# Patient Record
Sex: Female | Born: 1945 | Hispanic: No | Marital: Married | Smoking: Never smoker
Health system: Southern US, Community
[De-identification: ages and names within clinical notes are randomized; demographics above are authoritative.]

## PROBLEM LIST (undated history)

## (undated) DIAGNOSIS — C801 Malignant (primary) neoplasm, unspecified: Secondary | ICD-10-CM

## (undated) DIAGNOSIS — J189 Pneumonia, unspecified organism: Secondary | ICD-10-CM

## (undated) DIAGNOSIS — N83201 Unspecified ovarian cyst, right side: Secondary | ICD-10-CM

## (undated) DIAGNOSIS — R7303 Prediabetes: Secondary | ICD-10-CM

## (undated) DIAGNOSIS — R42 Dizziness and giddiness: Secondary | ICD-10-CM

## (undated) DIAGNOSIS — Z9889 Other specified postprocedural states: Secondary | ICD-10-CM

## (undated) DIAGNOSIS — M8580 Other specified disorders of bone density and structure, unspecified site: Secondary | ICD-10-CM

## (undated) DIAGNOSIS — N83202 Unspecified ovarian cyst, left side: Secondary | ICD-10-CM

## (undated) DIAGNOSIS — K219 Gastro-esophageal reflux disease without esophagitis: Secondary | ICD-10-CM

## (undated) DIAGNOSIS — R112 Nausea with vomiting, unspecified: Secondary | ICD-10-CM

## (undated) DIAGNOSIS — M1990 Unspecified osteoarthritis, unspecified site: Secondary | ICD-10-CM

## (undated) DIAGNOSIS — E559 Vitamin D deficiency, unspecified: Secondary | ICD-10-CM

## (undated) DIAGNOSIS — I10 Essential (primary) hypertension: Secondary | ICD-10-CM

## (undated) DIAGNOSIS — D279 Benign neoplasm of unspecified ovary: Secondary | ICD-10-CM

## (undated) DIAGNOSIS — E119 Type 2 diabetes mellitus without complications: Secondary | ICD-10-CM

## (undated) HISTORY — DX: Dizziness and giddiness: R42

## (undated) HISTORY — PX: JOINT REPLACEMENT: SHX530

## (undated) HISTORY — DX: Unspecified ovarian cyst, right side: N83.201

## (undated) HISTORY — DX: Other specified disorders of bone density and structure, unspecified site: M85.80

## (undated) HISTORY — DX: Malignant (primary) neoplasm, unspecified: C80.1

## (undated) HISTORY — DX: Benign neoplasm of unspecified ovary: D27.9

## (undated) HISTORY — PX: CHOLECYSTECTOMY: SHX55

## (undated) HISTORY — DX: Unspecified ovarian cyst, left side: N83.202

## (undated) HISTORY — PX: APPENDECTOMY: SHX54

## (undated) HISTORY — PX: CYST EXCISION: SHX5701

## (undated) HISTORY — PX: ROTATOR CUFF REPAIR: SHX139

## (undated) HISTORY — DX: Gastro-esophageal reflux disease without esophagitis: K21.9

## (undated) HISTORY — DX: Vitamin D deficiency, unspecified: E55.9

---

## 1999-04-11 ENCOUNTER — Other Ambulatory Visit: Admission: RE | Admit: 1999-04-11 | Discharge: 1999-04-11 | Payer: Self-pay | Admitting: Obstetrics and Gynecology

## 2000-04-15 ENCOUNTER — Other Ambulatory Visit: Admission: RE | Admit: 2000-04-15 | Discharge: 2000-04-15 | Payer: Self-pay | Admitting: Obstetrics and Gynecology

## 2001-02-17 ENCOUNTER — Other Ambulatory Visit: Admission: RE | Admit: 2001-02-17 | Discharge: 2001-02-17 | Payer: Self-pay | Admitting: Obstetrics and Gynecology

## 2001-02-17 ENCOUNTER — Encounter (INDEPENDENT_AMBULATORY_CARE_PROVIDER_SITE_OTHER): Payer: Self-pay | Admitting: Specialist

## 2003-08-06 ENCOUNTER — Other Ambulatory Visit: Admission: RE | Admit: 2003-08-06 | Discharge: 2003-08-06 | Payer: Self-pay | Admitting: Obstetrics and Gynecology

## 2004-08-27 ENCOUNTER — Other Ambulatory Visit: Admission: RE | Admit: 2004-08-27 | Discharge: 2004-08-27 | Payer: Self-pay | Admitting: Obstetrics and Gynecology

## 2005-09-01 ENCOUNTER — Other Ambulatory Visit: Admission: RE | Admit: 2005-09-01 | Discharge: 2005-09-01 | Payer: Self-pay | Admitting: Obstetrics and Gynecology

## 2006-09-09 ENCOUNTER — Other Ambulatory Visit: Admission: RE | Admit: 2006-09-09 | Discharge: 2006-09-09 | Payer: Self-pay | Admitting: Obstetrics and Gynecology

## 2006-10-23 HISTORY — PX: PELVIC LAPAROSCOPY: SHX162

## 2006-10-23 HISTORY — PX: OOPHORECTOMY: SHX86

## 2006-10-26 ENCOUNTER — Ambulatory Visit (HOSPITAL_BASED_OUTPATIENT_CLINIC_OR_DEPARTMENT_OTHER): Admission: RE | Admit: 2006-10-26 | Discharge: 2006-10-26 | Payer: Self-pay | Admitting: Obstetrics and Gynecology

## 2006-10-26 ENCOUNTER — Encounter (INDEPENDENT_AMBULATORY_CARE_PROVIDER_SITE_OTHER): Payer: Self-pay | Admitting: *Deleted

## 2007-09-12 ENCOUNTER — Other Ambulatory Visit: Admission: RE | Admit: 2007-09-12 | Discharge: 2007-09-12 | Payer: Self-pay | Admitting: Obstetrics and Gynecology

## 2008-09-19 ENCOUNTER — Encounter: Payer: Self-pay | Admitting: Obstetrics and Gynecology

## 2008-09-19 ENCOUNTER — Ambulatory Visit: Payer: Self-pay | Admitting: Obstetrics and Gynecology

## 2008-09-19 ENCOUNTER — Other Ambulatory Visit: Admission: RE | Admit: 2008-09-19 | Discharge: 2008-09-19 | Payer: Self-pay | Admitting: Obstetrics and Gynecology

## 2009-10-03 ENCOUNTER — Ambulatory Visit: Payer: Self-pay | Admitting: Obstetrics and Gynecology

## 2009-10-03 ENCOUNTER — Encounter: Payer: Self-pay | Admitting: Obstetrics and Gynecology

## 2009-10-03 ENCOUNTER — Other Ambulatory Visit: Admission: RE | Admit: 2009-10-03 | Discharge: 2009-10-03 | Payer: Self-pay | Admitting: Obstetrics and Gynecology

## 2010-07-01 ENCOUNTER — Ambulatory Visit: Payer: Self-pay | Admitting: Obstetrics and Gynecology

## 2010-10-20 ENCOUNTER — Ambulatory Visit: Payer: Self-pay | Admitting: Obstetrics and Gynecology

## 2010-10-20 ENCOUNTER — Other Ambulatory Visit
Admission: RE | Admit: 2010-10-20 | Discharge: 2010-10-20 | Payer: Self-pay | Source: Home / Self Care | Admitting: Obstetrics and Gynecology

## 2011-04-10 NOTE — Op Note (Signed)
Natalie Fowler, Natalie Fowler              ACCOUNT NO.:  [id number]   MEDICAL RECORD NO.:  [id number]          PATIENT TYPE:  AMB   LOCATION:  [facility]                         FACILITY:  [facility]   PHYSICIAN:  Daniel L. Gottsegen, M.D.DATE OF BIRTH:  December 19, 1945   DATE OF PROCEDURE:  10/26/2006  DATE OF DISCHARGE:                               OPERATIVE REPORT   PREOPERATIVE DIAGNOSIS:  Bilateral ovarian cyst.   POSTOPERATIVE DIAGNOSIS:  Bilateral ovarian cyst.   OPERATIONS:  Diagnostic laparoscopy with bilateral salpingo-  oophorectomy.   SURGEON:  Daniel L. Eda Paschal, M.D.   FIRST ASSISTANT:  Timothy P. Fontaine, M.D.   INDICATIONS:  The patient is a 65 year old postmenopausal woman who came  to the office and on ultrasound had two a small ovarian cysts. On the  right ovary.  It was a small complex cyst and on the left ovary.  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  These abnormal findings in the  postmenopausal state coupled with her strong family history of ovarian  cancer made it imperative that we remove her ovaries to be sure that  these were not malignant.  She now enters the hospital for the above.   FINDINGS:  The patient's uterus was almost normal size and shape.  There  were several small myomas that could be seen none of which was bigger  than 2 cm.  The right ovary had a small ovarian cyst that to be seen  through the capsule.  It was not opened.  The left ovary had a hard area  on one pole consistent with a fibroma.  Rest of the pelvic peritoneum  was free of disease.   PROCEDURE:  After adequate general orotracheal anesthesia the patient  was placed in the dorsal lithotomy position, prepped and draped usual  sterile manner.  A small subumbilical vertical incision was made.  The  OptiVu was attached to the diagnostic scope and under direct vision the  peritoneal cavity was entered without difficulty or trauma.  A  pneumoperitoneum was created.  Two 5 mm ports were  placed in the right  left lower quadrant areas.  Peritoneal washings were obtained and then  bilateral salpingo-oophorectomies were performed.  First the left ovary  and tube was removed.  The ureter was identified.  The IP ligament was  bipolared and cut and the rest of the attachments of the adnexa to the  broad ligament and to the uterus were bipolared and cut without  difficulty.  Attention was next turned to the right side.  The ovary was  once again identified.  The IP ligament was once again bipolared and  cut.  Once again all attachments of the adnexa to broad ligament and  uterus were bipolared and cut.  At this point the 10 mm scope was  removed.  A 5 mm scope was placed.  The Endopouch was placed through the  larger incision.  Both specimens were removed and then separated.  There  was no bleeding noted.  The procedure was terminated.  The subumbilical  fascial incision was closed with 0 Vicryl and  the skin incision at the  umbilicus  was closed with 3-0 Vicryl.  Dermabond was used on the other two  incisions.  Blood loss for the procedure was minimal.  The patient  tolerated the procedure well and left the operating room in satisfactory  condition.  Please note that during entire procedure a Robinson catheter  had been left in her bladder to keep the bladder drained.      Daniel L. Eda Paschal, M.D.  Electronically Signed     DLG/MEDQ  D:  10/26/2006  T:  10/26/2006  Job:  [id number]

## 2011-11-12 DIAGNOSIS — K219 Gastro-esophageal reflux disease without esophagitis: Secondary | ICD-10-CM | POA: Insufficient documentation

## 2011-11-12 DIAGNOSIS — D279 Benign neoplasm of unspecified ovary: Secondary | ICD-10-CM | POA: Insufficient documentation

## 2011-11-12 DIAGNOSIS — N83201 Unspecified ovarian cyst, right side: Secondary | ICD-10-CM | POA: Insufficient documentation

## 2011-11-12 DIAGNOSIS — M8580 Other specified disorders of bone density and structure, unspecified site: Secondary | ICD-10-CM | POA: Insufficient documentation

## 2011-11-23 ENCOUNTER — Encounter: Payer: Self-pay | Admitting: Obstetrics and Gynecology

## 2011-11-23 ENCOUNTER — Other Ambulatory Visit (HOSPITAL_COMMUNITY)
Admission: RE | Admit: 2011-11-23 | Discharge: 2011-11-23 | Disposition: A | Discharge status: Home / Self Care | Payer: Managed Care, Other (non HMO) | Source: Ambulatory Visit | Attending: Obstetrics and Gynecology | Admitting: Obstetrics and Gynecology

## 2011-11-23 ENCOUNTER — Ambulatory Visit (INDEPENDENT_AMBULATORY_CARE_PROVIDER_SITE_OTHER): Payer: Managed Care, Other (non HMO) | Admitting: Obstetrics and Gynecology

## 2011-11-23 VITALS — BP 124/78 | Ht 60.0 in | Wt 162.0 lb

## 2011-11-23 DIAGNOSIS — R823 Hemoglobinuria: Secondary | ICD-10-CM

## 2011-11-23 DIAGNOSIS — R42 Dizziness and giddiness: Secondary | ICD-10-CM | POA: Insufficient documentation

## 2011-11-23 DIAGNOSIS — Z01419 Encounter for gynecological examination (general) (routine) without abnormal findings: Secondary | ICD-10-CM

## 2011-11-23 NOTE — Progress Notes (Signed)
Patient came to see me today for her annual GYN exam. She is doing well without HRT. She is up-to-date on mammograms. We did not get her last one in she will have sent to me. She is up-to-date on bone densities. She has stable low bone mass without an elevated FRAX risk. She takes calcium and vitamin D. She has not had a fracture. She does her lab through her PCP. He is watching her spleen. She is having no vaginal bleeding or pelvic pain.  Physical examination:Natalie Fowler present.HEENT within normal limits. Neck: Thyroid not large. No masses. Supraclavicular nodes: not enlarged. Breasts: Examined in both sitting midline position. No skin changes and no masses. Abdomen: Soft no guarding rebound or masses or hernia. Pelvic: External: Within normal limits. BUS: Within normal limits. Vaginal:within normal limits. Good estrogen effect. No evidence of cystocele rectocele or enterocele. Cervix: clean. Uterus: Normal size and shape. Adnexa: No masses. Rectovaginal exam: Confirmatory and negative. Extremities: Within normal limits.  Assessment: Low bone mass.  Plan: Continue yearly mammograms. Continue periodic bone densities.

## 2011-11-25 LAB — URINE CULTURE

## 2012-07-11 ENCOUNTER — Other Ambulatory Visit: Payer: Self-pay | Admitting: Obstetrics and Gynecology

## 2012-07-11 DIAGNOSIS — M8580 Other specified disorders of bone density and structure, unspecified site: Secondary | ICD-10-CM

## 2012-07-12 ENCOUNTER — Ambulatory Visit (INDEPENDENT_AMBULATORY_CARE_PROVIDER_SITE_OTHER): Payer: BC Managed Care – PPO

## 2012-07-12 DIAGNOSIS — M949 Disorder of cartilage, unspecified: Secondary | ICD-10-CM

## 2012-07-12 DIAGNOSIS — M8580 Other specified disorders of bone density and structure, unspecified site: Secondary | ICD-10-CM

## 2012-08-10 ENCOUNTER — Encounter: Payer: Self-pay | Admitting: Obstetrics and Gynecology

## 2012-11-22 ENCOUNTER — Ambulatory Visit: Payer: BC Managed Care – PPO | Admitting: Obstetrics and Gynecology

## 2013-09-21 ENCOUNTER — Ambulatory Visit (INDEPENDENT_AMBULATORY_CARE_PROVIDER_SITE_OTHER): Payer: BC Managed Care – PPO | Admitting: Gynecology

## 2013-09-21 ENCOUNTER — Encounter: Payer: Self-pay | Admitting: Gynecology

## 2013-09-21 ENCOUNTER — Other Ambulatory Visit (HOSPITAL_COMMUNITY)
Admission: RE | Admit: 2013-09-21 | Discharge: 2013-09-21 | Disposition: A | Discharge status: Home / Self Care | Payer: BC Managed Care – PPO | Source: Ambulatory Visit | Attending: Gynecology | Admitting: Gynecology

## 2013-09-21 VITALS — BP 130/86 | Ht 61.25 in | Wt 163.0 lb

## 2013-09-21 DIAGNOSIS — N952 Postmenopausal atrophic vaginitis: Secondary | ICD-10-CM

## 2013-09-21 DIAGNOSIS — Z23 Encounter for immunization: Secondary | ICD-10-CM

## 2013-09-21 DIAGNOSIS — Z01419 Encounter for gynecological examination (general) (routine) without abnormal findings: Secondary | ICD-10-CM | POA: Insufficient documentation

## 2013-09-21 DIAGNOSIS — M8580 Other specified disorders of bone density and structure, unspecified site: Secondary | ICD-10-CM

## 2013-09-21 DIAGNOSIS — Z1151 Encounter for screening for human papillomavirus (HPV): Secondary | ICD-10-CM | POA: Insufficient documentation

## 2013-09-21 DIAGNOSIS — R635 Abnormal weight gain: Secondary | ICD-10-CM

## 2013-09-21 DIAGNOSIS — M899 Disorder of bone, unspecified: Secondary | ICD-10-CM

## 2013-09-21 DIAGNOSIS — Z1159 Encounter for screening for other viral diseases: Secondary | ICD-10-CM

## 2013-09-21 NOTE — Progress Notes (Signed)
Natalie Fowler 04-22-1946 [id number]   History:    67 y.o.  for annual gyn exam with no complaints today. Patient states that she has never been on hormone replacement therapy. Her PCP has been doing her lab work. Her flu vaccine is up-to-date. Patient's last bone density study was in 2013. The lowest T score was -1.6 at the [facility]. She also did have a FRAX analysis based on her hips or and was normal. 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 The patient has a very strong family history of GI malignancies whereby her father has history of colon cancer and one of her all goals had colon cancer and the other uncle had gastric cancer. Her last colonoscopy is due in 2016. Patient herself has had history of colon polyp. Patient denies any prior history of abnormal Pap smears. She has not received the Tdap vaccine yet. Her mammogram was reported to be normal this year.  Past medical history,surgical history, family history and social history were all reviewed and documented in the EPIC chart.  Gynecologic History No LMP recorded. Patient is postmenopausal. Contraception: post menopausal status Last Pap: 2012. Results were: normal Last mammogram: 2014. Results were: normal  Obstetric History OB History  Gravida Para Term Preterm AB SAB TAB Ectopic Multiple Living  2 2        2     # Outcome Date GA Lbr Len/2nd Weight Sex Delivery Anes PTL Lv  2 PAR           1 PAR                ROS: A ROS was performed and pertinent positives and negatives are included in the history.  GENERAL: No fevers or chills. HEENT: No change in vision, no earache, sore throat or sinus congestion. NECK: No pain or stiffness. CARDIOVASCULAR: No chest pain or pressure. No palpitations. PULMONARY: No shortness of breath, cough or wheeze. GASTROINTESTINAL: No abdominal pain, nausea, vomiting or diarrhea, melena or bright red blood per  rectum. GENITOURINARY: No urinary frequency, urgency, hesitancy or dysuria. MUSCULOSKELETAL: No joint or muscle pain, no back pain, no recent trauma. DERMATOLOGIC: No rash, no itching, no lesions. ENDOCRINE: No polyuria, polydipsia, no heat or cold intolerance. No recent change in weight. HEMATOLOGICAL: No anemia or easy bruising or bleeding. NEUROLOGIC: No headache, seizures, numbness, tingling or weakness. PSYCHIATRIC: No depression, no loss of interest in normal activity or change in sleep pattern.     Exam: chaperone present  BP 130/86  Ht 5' 1.25" (1.556 m)  Wt 163 lb (73.936 kg)  BMI 30.54 kg/m2  Body mass index is 30.54 kg/(m^2).  General appearance : Well developed well nourished female. No acute distress HEENT: Neck supple, trachea midline, no carotid bruits, no thyroidmegaly Lungs: Clear to auscultation, no rhonchi or wheezes, or rib retractions  Heart: Regular rate and rhythm, no murmurs or gallops Breast:Examined in sitting and supine position were symmetrical in appearance, no palpable masses or tenderness,  no skin retraction, no nipple inversion, no nipple discharge, no skin discoloration, no axillary or supraclavicular lymphadenopathy Abdomen: no palpable masses or tenderness, no rebound or guarding Extremities: no edema or skin discoloration or tenderness  Pelvic:  Bartholin, Urethra, Skene Glands: Within normal limits             Vagina: No gross lesions or discharge  Cervix: No gross lesions or discharge  Uterus  anteverted, normal size, shape and consistency,  non-tender and mobile  Adnexa  Without masses or tenderness  Anus and perineum  normal   Rectovaginal  normal sphincter tone without palpated masses or tenderness             Hemoccult no palpable masses or tenderness     Assessment/Plan:  68 y.o. female for annual exam doing well on no hormone replacement therapy. PCP doing her labs next week. Pap smear was done today. This will be patient's last Pap smear  in accordance to the new guidelines. She was monitored in her monthly breast exam. We discussed the importance of calcium and vitamin D and regular exercise for osteoporosis prevention. Patient did receive a Tdap vaccine today. She was provided with Hemoccult card system in to the office for testing.  New CDC guidelines is recommending patients be tested once in her lifetime for hepatitis C antibody who were born between [birth year] through 1963. This was discussed with the patient today and has agreed to be tested today.  Note: This dictation was prepared with  Dragon/digital dictation along withSmart phrase technology. Any transcriptional errors that result from this process are unintentional.   Ok Edwards MD, 12:10 PM 09/21/2013

## 2013-09-21 NOTE — Addendum Note (Signed)
Addended by: Bertram Savin A on: 09/21/2013 12:25 PM   Modules accepted: Orders

## 2013-09-21 NOTE — Patient Instructions (Signed)

## 2013-09-26 ENCOUNTER — Encounter: Payer: Self-pay | Admitting: Obstetrics and Gynecology

## 2014-09-19 ENCOUNTER — Encounter: Payer: Self-pay | Admitting: Gynecology

## 2014-09-24 ENCOUNTER — Encounter: Payer: Self-pay | Admitting: Gynecology

## 2014-09-25 ENCOUNTER — Ambulatory Visit (INDEPENDENT_AMBULATORY_CARE_PROVIDER_SITE_OTHER): Payer: 59 | Admitting: Gynecology

## 2014-09-25 ENCOUNTER — Encounter: Payer: Self-pay | Admitting: Gynecology

## 2014-09-25 VITALS — BP 126/84 | Ht 60.75 in | Wt 171.0 lb

## 2014-09-25 DIAGNOSIS — IMO0002 Reserved for concepts with insufficient information to code with codable children: Secondary | ICD-10-CM

## 2014-09-25 DIAGNOSIS — M8580 Other specified disorders of bone density and structure, unspecified site: Secondary | ICD-10-CM

## 2014-09-25 DIAGNOSIS — Z79890 Hormone replacement therapy (postmenopausal): Secondary | ICD-10-CM

## 2014-09-25 DIAGNOSIS — N941 Dyspareunia: Secondary | ICD-10-CM

## 2014-09-25 DIAGNOSIS — N952 Postmenopausal atrophic vaginitis: Secondary | ICD-10-CM | POA: Insufficient documentation

## 2014-09-25 MED ORDER — ESTRADIOL 10 MCG VA TABS: 1.0000 | ORAL_TABLET | VAGINAL | Status: DC

## 2014-09-25 NOTE — Patient Instructions (Signed)

## 2014-09-25 NOTE — Progress Notes (Signed)
Natalie Fowler [birth year] [id number]   History:    68 y.o.  for GYN exam and follow-up. Patient's only complaint is been of vaginal dryness and dyspareunia times. Patient has never been on any hormone replacement therapy. Her PCP has been doing her lab work. Her flu vaccine is up-to-date. Patient's last bone density study was in 2013. The lowest T score was -1.6 at the [facility]. She also did have a FRAX analysis based on her hips or and was normal. 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 The patient has a very strong family history of GI malignancies whereby her father has history of colon cancer and one of her all goals had colon cancer and the other uncle had gastric cancer. Her Next colonoscopy is due in 2016. Patient herself has had history of colon polyp. Patient denies any prior history of abnormal Pap smears.  Patient last year was tested for hepatitis C and was negative in accordance to the new guidelines. Patient shingles vaccine and flu vaccine and TD@vaccine  are up-to-date.  Past medical history,surgical history, family history and social history were all reviewed and documented in the EPIC chart.  Gynecologic History No LMP recorded. Patient is postmenopausal. Contraception: post menopausal status Last Pap: 2014. Results were: normal Last mammogram: 2015. Results were: normal but dense she will need 3-dimensional mammogram next year  Obstetric History OB History  Gravida Para Term Preterm AB SAB TAB Ectopic Multiple Living  2 2        2     # Outcome Date GA Lbr Len/2nd Weight Sex Delivery Anes PTL Lv  2 Para           1 Para                ROS: A ROS was performed and pertinent positives and negatives are included in the history.  GENERAL: No fevers or chills. HEENT: No change in vision, no earache, sore throat or sinus congestion. NECK: No pain or stiffness. CARDIOVASCULAR: No  chest pain or pressure. No palpitations. PULMONARY: No shortness of breath, cough or wheeze. GASTROINTESTINAL: No abdominal pain, nausea, vomiting or diarrhea, melena or bright red blood per rectum. GENITOURINARY: No urinary frequency, urgency, hesitancy or dysuria. MUSCULOSKELETAL: No joint or muscle pain, no back pain, no recent trauma. DERMATOLOGIC: No rash, no itching, no lesions. ENDOCRINE: No polyuria, polydipsia, no heat or cold intolerance. No recent change in weight. HEMATOLOGICAL: No anemia or easy bruising or bleeding. NEUROLOGIC: No headache, seizures, numbness, tingling or weakness. PSYCHIATRIC: No depression, no loss of interest in normal activity or change in sleep pattern.     Exam: chaperone present  BP 126/84 mmHg  Ht 5' 0.75" (1.543 m)  Wt 171 lb (77.565 kg)  BMI 32.58 kg/m2  Body mass index is 32.58 kg/(m^2).  General appearance : Well developed well nourished female. No acute distress HEENT: Neck supple, trachea midline, no carotid bruits, no thyroidmegaly Lungs: Clear to auscultation, no rhonchi or wheezes, or rib retractions  Heart: Regular rate and rhythm, no murmurs or gallops Breast:Examined in sitting and supine position were symmetrical in appearance, no palpable masses or tenderness,  no skin retraction, no nipple inversion, no nipple discharge, no skin discoloration, no axillary or supraclavicular lymphadenopathy Abdomen: no palpable masses or tenderness, no rebound or guarding Extremities: no edema or skin discoloration or tenderness  Pelvic:  Bartholin, Urethra, Skene Glands: Within normal limits  Vagina: No gross lesions or discharge, vaginal atrophy  Cervix: No gross lesions or discharge  Uterus  anteverted, normal size, shape and consistency, non-tender and mobile  Adnexa  Without masses or tenderness  Anus and perineum  normal   Rectovaginal  normal sphincter tone without palpated masses or tenderness             Hemoccult will be provided  today     Assessment/Plan:  68 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 She will be prescribed Vagifem 10 g to apply intravaginally twice a week. Risk benefits and pros and cons were discussed. The women's health initiative study was discussed. Pap smear not done today in accordance to the new guidelines. Patient with no prior history of abnormal Pap smears.patient was reminded to do her monthly breast exam. Next year she will need a three-dimensional mammogram as a result of her dense breasts that was noted recently. We discussed importance of calcium vitamin D and regular exercise for osteoporosis prevention. Patient will schedule her bone density study in the next few weeks. She was provided with fecal Hemoccult cards to submit to the office for testing.   Terrance Mass MD, 11:17 AM 09/25/2014

## 2014-10-09 ENCOUNTER — Ambulatory Visit (INDEPENDENT_AMBULATORY_CARE_PROVIDER_SITE_OTHER): Payer: 59

## 2014-10-09 DIAGNOSIS — M8580 Other specified disorders of bone density and structure, unspecified site: Secondary | ICD-10-CM

## 2014-10-24 ENCOUNTER — Other Ambulatory Visit: Payer: Self-pay | Admitting: Gynecology

## 2014-10-24 DIAGNOSIS — M8580 Other specified disorders of bone density and structure, unspecified site: Secondary | ICD-10-CM

## 2014-10-25 ENCOUNTER — Other Ambulatory Visit: Payer: 59

## 2014-10-25 DIAGNOSIS — M8580 Other specified disorders of bone density and structure, unspecified site: Secondary | ICD-10-CM

## 2014-10-26 LAB — PTH, INTACT AND CALCIUM
CALCIUM: 9.4 mg/dL (ref 8.4–10.5)
PTH: 43 pg/mL (ref 14–64)

## 2014-10-26 LAB — VITAMIN D 25 HYDROXY (VIT D DEFICIENCY, FRACTURES): Vit D, 25-Hydroxy: 23 ng/mL — ABNORMAL LOW (ref 30–100)

## 2014-10-29 ENCOUNTER — Other Ambulatory Visit: Payer: Self-pay | Admitting: Gynecology

## 2014-10-29 DIAGNOSIS — E559 Vitamin D deficiency, unspecified: Secondary | ICD-10-CM

## 2014-10-29 MED ORDER — VITAMIN D (ERGOCALCIFEROL) 1.25 MG (50000 UNIT) PO CAPS: 50000.0000 [IU] | ORAL_CAPSULE | ORAL | Status: DC

## 2014-11-02 ENCOUNTER — Encounter: Payer: Self-pay | Admitting: Gynecology

## 2014-11-02 ENCOUNTER — Ambulatory Visit (INDEPENDENT_AMBULATORY_CARE_PROVIDER_SITE_OTHER): Payer: 59 | Admitting: Gynecology

## 2014-11-02 VITALS — BP 134/88

## 2014-11-02 DIAGNOSIS — M8580 Other specified disorders of bone density and structure, unspecified site: Secondary | ICD-10-CM

## 2014-11-02 DIAGNOSIS — Z79890 Hormone replacement therapy (postmenopausal): Secondary | ICD-10-CM

## 2014-11-02 DIAGNOSIS — N952 Postmenopausal atrophic vaginitis: Secondary | ICD-10-CM

## 2014-11-02 MED ORDER — RALOXIFENE HCL 60 MG PO TABS: 60.0000 mg | ORAL_TABLET | Freq: Every day | ORAL | Status: DC

## 2014-11-02 NOTE — Progress Notes (Signed)
   Patient is a 68 year old who presented to the office today to discuss her bone density study. She was seen the office for her annual exam on November 3. Her bone density study done in 2013 demonstrated that her lowest T score was -1.6 at the [facility]. She also did have a FRAX analysis based on her hips or and was normal. 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 On that office visit for vaginal atrophy she was started on Vagifem 10 g twice a week which she has not started yet.  Her bone density study was done here in office on November 17 and was compared with the previous study done here as well on 07/12/2012. [facility] T score -2.2 (significant decrease in bone mineralization -6.7%) Right and left femoral neck T score -1.2 Normal Frax analysis  Patient had a normal calcium, vitamin D and PTH level December 2015.  In consultation we discussed the above findings indicating significant decrease in bone mineralization at the [facility]. Because of this on low she had a normal Frax analysis based on hip analysis I'm going to recommend that we start antiresorptive agent. Patient suffers from gastroesophageal reflux. We discussed that her bone density study indicates that her bone mass is 20-25% below normal and her spine fracture risk is 5 times greater than the normal population and her risk of hip fracture 7 times greater than the normal population.  Patient will be started on Evista 60 mg daily. The risks benefits and pros and cons were discussed to include DVT. We also discussed repeating the bone density study in one year to determine if the medication is been effective and monitor response to treatment. We discussed importance of calcium vitamin D and regular exercise for further osteoporosis prevention.

## 2014-11-02 NOTE — Patient Instructions (Signed)
Raloxifene tablets What is this medicine? RALOXIFENE (ral OX i feen) reduces the amount of calcium lost from bones. It is used to treat and prevent osteoporosis in women who have experienced menopause. This medicine may be used for other purposes; ask your health care provider or pharmacist if you have questions. COMMON BRAND NAME(S): Evista What should I tell my health care provider before I take this medicine? They need to know if you have any of these conditions: -a history of blood clots -cancer -heart failure -liver disease -premenopausal -an unusual or allergic reaction to raloxifene, other medicines, foods, dyes, or preservatives -pregnant or trying to get pregnant -breast-feeding How should I use this medicine? Take this medicine by mouth with a glass of water. Follow the directions on the prescription label. The tablets can be taken with or without food. Take your doses at regular intervals. Do not take your medicine more often than directed. Talk to your pediatrician regarding the use of this medicine in children. Special care may be needed. Overdosage: If you think you have taken too much of this medicine contact a poison control center or emergency room at once. NOTE: This medicine is only for you. Do not share this medicine with others. What if I miss a dose? If you miss a dose, take it as soon as you can. If it is almost time for your next dose, take only that dose. Do not take double or extra doses. What may interact with this medicine? -ampicillin -cholestyramine -colestipol -diazepam -diazoxide -female hormones like hormone replacement therapy -lidocaine -warfarin This list may not describe all possible interactions. Give your health care provider a list of all the medicines, herbs, non-prescription drugs, or dietary supplements you use. Also tell them if you smoke, drink alcohol, or use illegal drugs. Some items may interact with your medicine. What should I watch  for while using this medicine? Visit your doctor or health care professional for regular checks on your progress. Do not stop taking this medicine except on the advice of your doctor or health care professional. You should make sure you get enough calcium and vitamin D in your diet while you are taking this medicine. Discuss your dietary needs with your health care professional or nutritionist. Exercise may help to prevent bone loss. Discuss your exercise needs with your doctor or health care professional. This medicine can rarely cause blood clots. You should avoid long periods of bed rest while taking this medicine. If you are going to have surgery, tell your doctor or health care professional that you are taking this medicine. This medicine should be stopped at least 3 days before surgery. After surgery, it should be restarted only after you are walking again. It should not be restarted while you still need long periods of bed rest. You should not smoke while taking this medicine. Smoking may also increase your risk of blood clots. Smoking can also decrease the effects of this medicine. This medicine does not prevent hot flashes. It may cause hot flashes in some patients at the start of therapy. What side effects may I notice from receiving this medicine? Side effects that you should report to your doctor or health care professional as soon as possible: -change in vision -chest pain -difficulty breathing -leg pain or swelling -skin rash, itching Side effects that usually do not require medical attention (report to your doctor or health care professional if they continue or are bothersome): -fluid build-up -leg cramps -stomach pain -sweating This list may not  describe all possible side effects. Call your doctor for medical advice about side effects. You may report side effects to FDA at 1-800-FDA-1088. Where should I keep my medicine? Keep out of the reach of children. Store at room  temperature between 15 and 30 degrees C (59 and 86 degrees F). Throw away any unused medicine after the expiration date. NOTE: This sheet is a summary. It may not cover all possible information. If you have questions about this medicine, talk to your doctor, pharmacist, or health care provider.  2015, Elsevier/Gold Standard. (2008-10-25 15:15:14) Osteoporosis Throughout your life, your body breaks down old bone and replaces it with new bone. As you get older, your body does not replace bone as quickly as it breaks it down. By the age of 16 years, most people begin to gradually lose bone because of the imbalance between bone loss and replacement. Some people lose more bone than others. Bone loss beyond a specified normal degree is considered osteoporosis.  Osteoporosis affects the strength and durability of your bones. The inside of the ends of your bones and your flat bones, like the bones of your pelvis, look like honeycomb, filled with tiny open spaces. As bone loss occurs, your bones become less dense. This means that the open spaces inside your bones become bigger and the walls between these spaces become thinner. This makes your bones weaker. Bones of a person with osteoporosis can become so weak that they can break (fracture) during minor accidents, such as a simple fall. CAUSES  The following factors have been associated with the development of osteoporosis:  Smoking.  Drinking more than 2 alcoholic drinks several days per week.  Long-term use of certain medicines:  Corticosteroids.  Chemotherapy medicines.  Thyroid medicines.  Antiepileptic medicines.  Gonadal hormone suppression medicine.  Immunosuppression medicine.  Being underweight.  Lack of physical activity.  Lack of exposure to the sun. This can lead to vitamin D deficiency.  Certain medical conditions:  Certain inflammatory bowel diseases, such as Crohn disease and ulcerative  colitis.  Diabetes.  Hyperthyroidism.  Hyperparathyroidism. RISK FACTORS Anyone can develop osteoporosis. However, the following factors can increase your risk of developing osteoporosis:  Gender--Women are at higher risk than men.  Age--Being older than 50 years increases your risk.  Ethnicity--White and Asian people have an increased risk.  Weight --Being extremely underweight can increase your risk of osteoporosis.  Family history of osteoporosis--Having a family member who has developed osteoporosis can increase your risk. SYMPTOMS  Usually, people with osteoporosis have no symptoms.  DIAGNOSIS  Signs during a physical exam that may prompt your caregiver to suspect osteoporosis include:  Decreased height. This is usually caused by the compression of the bones that form your spine (vertebrae) because they have weakened and become fractured.  A curving or rounding of the upper back (kyphosis). To confirm signs of osteoporosis, your caregiver may request a procedure that uses 2 low-dose X-ray beams with different levels of energy to measure your bone mineral density (dual-energy X-ray absorptiometry [DXA]). Also, your caregiver may check your level of vitamin D. TREATMENT  The goal of osteoporosis treatment is to strengthen bones in order to decrease the risk of bone fractures. There are different types of medicines available to help achieve this goal. Some of these medicines work by slowing the processes of bone loss. Some medicines work by increasing bone density. Treatment also involves making sure that your levels of calcium and vitamin D are adequate. PREVENTION  There are things  you can do to help prevent osteoporosis. Adequate intake of calcium and vitamin D can help you achieve optimal bone mineral density. Regular exercise can also help, especially resistance and weight-bearing activities. If you smoke, quitting smoking is an important part of osteoporosis prevention. MAKE  SURE YOU:  Understand these instructions.  Will watch your condition.  Will get help right away if you are not doing well or get worse. FOR MORE INFORMATION www.osteo.org and EquipmentWeekly.com.ee Document Released: 08/19/2005 Document Revised: 03/06/2013 Document Reviewed: 10/24/2011 [facility] Patient Information 2015 [place], [place]. This information is not intended to replace advice given to you by your health care provider. Make sure you discuss any questions you have with your health care provider.

## 2014-11-22 ENCOUNTER — Telehealth: Payer: Self-pay

## 2014-11-22 NOTE — Telephone Encounter (Addendum)
Patient is finishing her Vit D 50000 and needed to be reminded what to do next. I advised she will need to have lab for Vit D level and She was also advised to start Vit D3 2000 units daily.  Upon review of chart she just started Vit D 50000 this month. What happened is her pharmacy only gave her #4 pills with 2 refills. I explained to her that some ins co will not pay for 3 mos at a time and the pharmacy gave her one month with 2 refills. She will get this refilled and continue taking for 12 weeks. She will call at the end of Rx for lab appt to have Vit D checked.  Patient wanted to be sure okay to take her fish oil and multivitamin now that she is taking Evista. I told her I could not imagine a problem with that but I would check with Dr. Nicki Reaper?

## 2014-11-22 NOTE — Telephone Encounter (Signed)
Patient reassured.  

## 2014-11-22 NOTE — Telephone Encounter (Signed)
No contraindication reassure her.

## 2014-11-26 ENCOUNTER — Other Ambulatory Visit: Payer: Self-pay

## 2015-01-28 ENCOUNTER — Other Ambulatory Visit: Payer: 59

## 2015-01-28 DIAGNOSIS — E559 Vitamin D deficiency, unspecified: Secondary | ICD-10-CM

## 2015-01-29 ENCOUNTER — Other Ambulatory Visit: Payer: Self-pay | Admitting: Gynecology

## 2015-01-29 ENCOUNTER — Encounter: Payer: Self-pay | Admitting: Gynecology

## 2015-01-29 DIAGNOSIS — E559 Vitamin D deficiency, unspecified: Secondary | ICD-10-CM

## 2015-01-29 LAB — VITAMIN D 25 HYDROXY (VIT D DEFICIENCY, FRACTURES): VIT D 25 HYDROXY: 28 ng/mL — AB (ref 30–100)

## 2015-01-29 MED ORDER — VITAMIN D (ERGOCALCIFEROL) 1.25 MG (50000 UNIT) PO CAPS: ORAL_CAPSULE | ORAL | Status: DC

## 2015-04-30 ENCOUNTER — Telehealth: Payer: Self-pay | Admitting: *Deleted

## 2015-04-30 ENCOUNTER — Other Ambulatory Visit: Payer: PPO

## 2015-04-30 DIAGNOSIS — E559 Vitamin D deficiency, unspecified: Secondary | ICD-10-CM

## 2015-04-30 MED ORDER — RALOXIFENE HCL 60 MG PO TABS: 60.0000 mg | ORAL_TABLET | Freq: Every day | ORAL | Status: DC

## 2015-04-30 NOTE — Telephone Encounter (Signed)
Pt would like Rx mailed to her address, this will be done

## 2015-04-30 NOTE — Telephone Encounter (Signed)
-----   Message from Bishop Limbo sent at 04/30/2015 11:21 AM EDT ----- Natalie Fowler patient has new insurance and stopped by to request a new 90 day RX for her Evista medication. Can you have JF do that for her? Thanks.

## 2015-05-01 ENCOUNTER — Other Ambulatory Visit: Payer: Self-pay | Admitting: Gynecology

## 2015-05-01 DIAGNOSIS — E559 Vitamin D deficiency, unspecified: Secondary | ICD-10-CM

## 2015-05-01 LAB — VITAMIN D 25 HYDROXY (VIT D DEFICIENCY, FRACTURES): VIT D 25 HYDROXY: 25 ng/mL — AB (ref 30–100)

## 2015-05-01 MED ORDER — VITAMIN D (ERGOCALCIFEROL) 1.25 MG (50000 UNIT) PO CAPS: 50000.0000 [IU] | ORAL_CAPSULE | ORAL | Status: DC

## 2015-05-01 MED ORDER — RALOXIFENE HCL 60 MG PO TABS: 60.0000 mg | ORAL_TABLET | Freq: Every day | ORAL | Status: DC

## 2015-05-06 ENCOUNTER — Telehealth: Payer: Self-pay | Admitting: *Deleted

## 2015-05-06 NOTE — Telephone Encounter (Signed)
Patient aware she is on correct Rx.

## 2015-05-06 NOTE — Telephone Encounter (Signed)
Pt was prescribed Vitamin D 50,000 unit apparently the pharmacy gave her Vitamin D2 50,000units. Just to confirm the patient should be taking Vitamin D3 50,000 correct?

## 2015-05-06 NOTE — Telephone Encounter (Signed)
As always: 50,000 units Vit D weekly for 12 weeks then Vit D3   2,000 units weekly thereafter. Vitamin D level blood test after the 12weeks

## 2015-08-14 ENCOUNTER — Other Ambulatory Visit: Payer: Self-pay

## 2015-08-14 DIAGNOSIS — Z1231 Encounter for screening mammogram for malignant neoplasm of breast: Secondary | ICD-10-CM

## 2015-09-19 ENCOUNTER — Ambulatory Visit
Admission: RE | Admit: 2015-09-19 | Discharge: 2015-09-19 | Disposition: A | Discharge status: Home / Self Care | Payer: PPO | Source: Ambulatory Visit

## 2015-09-19 DIAGNOSIS — Z1231 Encounter for screening mammogram for malignant neoplasm of breast: Secondary | ICD-10-CM

## 2015-09-30 ENCOUNTER — Encounter: Payer: Self-pay | Admitting: Gynecology

## 2015-09-30 ENCOUNTER — Ambulatory Visit (INDEPENDENT_AMBULATORY_CARE_PROVIDER_SITE_OTHER): Payer: PPO | Admitting: Gynecology

## 2015-09-30 VITALS — BP 126/80 | Ht 60.75 in | Wt 163.0 lb

## 2015-09-30 DIAGNOSIS — Z9889 Other specified postprocedural states: Secondary | ICD-10-CM

## 2015-09-30 DIAGNOSIS — Z8639 Personal history of other endocrine, nutritional and metabolic disease: Secondary | ICD-10-CM

## 2015-09-30 DIAGNOSIS — Z800 Family history of malignant neoplasm of digestive organs: Secondary | ICD-10-CM | POA: Diagnosis not present

## 2015-09-30 DIAGNOSIS — Z86010 Personal history of colonic polyps: Secondary | ICD-10-CM | POA: Diagnosis not present

## 2015-09-30 DIAGNOSIS — M8580 Other specified disorders of bone density and structure, unspecified site: Secondary | ICD-10-CM

## 2015-09-30 DIAGNOSIS — Z01419 Encounter for gynecological examination (general) (routine) without abnormal findings: Secondary | ICD-10-CM

## 2015-09-30 MED ORDER — RALOXIFENE HCL 60 MG PO TABS: 60.0000 mg | ORAL_TABLET | Freq: Every day | ORAL | Status: DC

## 2015-09-30 NOTE — Addendum Note (Signed)
Addended by: Terrance Mass on: 09/30/2015 08:38 AM   Modules accepted: Level of Service

## 2015-09-30 NOTE — Progress Notes (Signed)
Natalie Fowler 03-08-1946 [id number]   History:    69 y.o.  for annual gyn exam with no complaints today. Patient with known history of vitamin D deficiency. She's currently on 50,000 units every weekly and will complete 6 months of treatment later this month. We are going to check a vitamin D level today. 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 She is tolerate the medicine well without any problems. Patient this year had a colonoscopy were by benign polyps were removed. She is on no hormone replacement therapy. Her follow had died of colon cancer at the age of 83. Patient's vaccines are all up-to-date. Her PCP is Dr. Henreitta Cea who has been doing her blood work.ied  patient with no past history of abnormal Pap smears.   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   Past medical history,surgical history, family history and social history were all reviewed and documented in the EPIC chart.  Gynecologic History No LMP recorded. Patient is postmenopausal. Contraception: post menopausal status Last Pap:  2014 results were: normal Last mammogram:  2016ults were: normal 3-D mammogram dense breasts   Obstetric History OB History  Gravida Para Term Preterm AB SAB TAB Ectopic Multiple Living  2 2        2     # Outcome Date GA Lbr Len/2nd Weight Sex Delivery Anes PTL Lv  2 Para           1 Para                ROS: A ROS was performed and pertinent positives and negatives are included in the history.  GENERAL:  No fevers or chills. HEENT: No change in vision, no earache, sore throat or sinus congestion. NECK: No pain or stiffness. CARDIOVASCULAR: No chest pain or pressure. No palpitations. PULMONARY: No shortness of breath, cough or wheeze. GASTROINTESTINAL: No abdominal pain, nausea, vomiting or diarrhea, melena or bright red blood per rectum. GENITOURINARY: No urinary frequency, urgency, hesitancy or dysuria. MUSCULOSKELETAL: No joint or muscle pain, no back pain, no recent trauma. DERMATOLOGIC: No rash, no itching, no lesions. ENDOCRINE: No polyuria, polydipsia, no heat or cold intolerance. No recent change in weight. HEMATOLOGICAL: No anemia or easy bruising or bleeding. NEUROLOGIC: No headache, seizures, numbness, tingling or weakness. PSYCHIATRIC: No depression, no loss of interest in normal activity or change in sleep pattern.     Exam: chaperone present  BP 126/80 mmHg  Ht 5' 0.75" (1.543 m)  Wt 163 lb (73.936 kg)  BMI 31.05 kg/m2  Body mass index is 31.05 kg/(m^2).  General appearance : Well developed well nourished female. No acute distress HEENT: Eyes: no retinal hemorrhage or exudates,  Neck supple, trachea midline, no carotid bruits, no thyroidmegaly Lungs: Clear to auscultation, no rhonchi or wheezes, or rib retractions  Heart: Regular rate and rhythm, no murmurs or gallops Breast:Examined in sitting and supine position were symmetrical in appearance, no palpable masses or tenderness,  no skin retraction, no nipple inversion, no nipple discharge,  no skin discoloration, no axillary or supraclavicular lymphadenopathy Abdomen: no palpable masses or tenderness, no rebound or guarding Extremities: no edema or skin discoloration or tenderness  Pelvic:  Bartholin, Urethra, Skene Glands: Within normal limits             Vagina: No gross lesions or discharge, vaginal atrophy   Cervix: No gross lesions or discharge  Uterus   antevertedrmal size, shape and consistency, non-tender and  mobile  Adnexa  Without masses or tenderness  Anus and perineum  normal   Rectovaginal  normal sphincter tone without palpated masses or tenderness             Hemoccult colonoscopy this year.     Assessment/Plan:  69 y.o. female for annual examwith history of vitamin D deficiency. A vitamin D level will be checked today. Patient due for her next bone density study next year. Patient was reminded to do her monthly breast exam. No Pap smear done today in accordance to the new guidelines. PCP doing patient blood work.    Terrance Mass MD, 8:31 AM 09/30/2015

## 2015-10-01 LAB — VITAMIN D 25 HYDROXY (VIT D DEFICIENCY, FRACTURES): VIT D 25 HYDROXY: 38 ng/mL (ref 30–100)

## 2015-10-02 ENCOUNTER — Encounter: Payer: PPO | Admitting: Gynecology

## 2016-03-12 DIAGNOSIS — N3011 Interstitial cystitis (chronic) with hematuria: Secondary | ICD-10-CM | POA: Diagnosis not present

## 2016-03-12 DIAGNOSIS — R823 Hemoglobinuria: Secondary | ICD-10-CM | POA: Diagnosis not present

## 2016-04-28 DIAGNOSIS — J329 Chronic sinusitis, unspecified: Secondary | ICD-10-CM | POA: Diagnosis not present

## 2016-05-07 DIAGNOSIS — N3011 Interstitial cystitis (chronic) with hematuria: Secondary | ICD-10-CM | POA: Diagnosis not present

## 2016-05-15 DIAGNOSIS — N3011 Interstitial cystitis (chronic) with hematuria: Secondary | ICD-10-CM | POA: Diagnosis not present

## 2016-05-22 DIAGNOSIS — N3011 Interstitial cystitis (chronic) with hematuria: Secondary | ICD-10-CM | POA: Diagnosis not present

## 2016-05-29 ENCOUNTER — Other Ambulatory Visit: Payer: Self-pay | Admitting: Gynecology

## 2016-05-29 DIAGNOSIS — N3011 Interstitial cystitis (chronic) with hematuria: Secondary | ICD-10-CM | POA: Diagnosis not present

## 2016-05-29 MED ORDER — RALOXIFENE HCL 60 MG PO TABS: 60.0000 mg | ORAL_TABLET | Freq: Every day | ORAL | Status: DC

## 2016-06-05 DIAGNOSIS — N3011 Interstitial cystitis (chronic) with hematuria: Secondary | ICD-10-CM | POA: Diagnosis not present

## 2016-06-15 ENCOUNTER — Other Ambulatory Visit: Payer: Self-pay | Admitting: Gynecology

## 2016-06-15 ENCOUNTER — Telehealth: Payer: Self-pay | Admitting: *Deleted

## 2016-06-15 MED ORDER — RALOXIFENE HCL 60 MG PO TABS: 60.0000 mg | ORAL_TABLET | Freq: Every day | ORAL | 0 refills | Status: DC

## 2016-06-15 NOTE — Telephone Encounter (Signed)
Pt called requesting refill on Evista 60 mg tablet, annual due in Nov. 2017 per note on 09/30/15 " the AP spine with a T score of -2.2 for this reason she was started on Evista 60 mg daily "  Rx will be sent to local pharmacy per pt request.

## 2016-06-26 DIAGNOSIS — R3129 Other microscopic hematuria: Secondary | ICD-10-CM | POA: Diagnosis not present

## 2016-06-26 DIAGNOSIS — N3011 Interstitial cystitis (chronic) with hematuria: Secondary | ICD-10-CM | POA: Diagnosis not present

## 2016-06-26 DIAGNOSIS — R823 Hemoglobinuria: Secondary | ICD-10-CM | POA: Diagnosis not present

## 2016-07-03 ENCOUNTER — Other Ambulatory Visit: Payer: Self-pay | Admitting: Family Medicine

## 2016-07-03 ENCOUNTER — Other Ambulatory Visit: Payer: Self-pay | Admitting: Gynecology

## 2016-07-03 DIAGNOSIS — Z1231 Encounter for screening mammogram for malignant neoplasm of breast: Secondary | ICD-10-CM

## 2016-08-07 DIAGNOSIS — R3129 Other microscopic hematuria: Secondary | ICD-10-CM | POA: Diagnosis not present

## 2016-08-07 DIAGNOSIS — N3011 Interstitial cystitis (chronic) with hematuria: Secondary | ICD-10-CM | POA: Diagnosis not present

## 2016-08-10 DIAGNOSIS — H524 Presbyopia: Secondary | ICD-10-CM | POA: Diagnosis not present

## 2016-08-10 DIAGNOSIS — H527 Unspecified disorder of refraction: Secondary | ICD-10-CM | POA: Diagnosis not present

## 2016-09-18 DIAGNOSIS — N3010 Interstitial cystitis (chronic) without hematuria: Secondary | ICD-10-CM | POA: Diagnosis not present

## 2016-09-21 ENCOUNTER — Other Ambulatory Visit: Payer: Self-pay | Admitting: Gynecology

## 2016-09-21 DIAGNOSIS — Z23 Encounter for immunization: Secondary | ICD-10-CM | POA: Diagnosis not present

## 2016-09-21 DIAGNOSIS — M545 Low back pain: Secondary | ICD-10-CM | POA: Diagnosis not present

## 2016-09-21 DIAGNOSIS — E669 Obesity, unspecified: Secondary | ICD-10-CM | POA: Diagnosis not present

## 2016-09-21 DIAGNOSIS — R7303 Prediabetes: Secondary | ICD-10-CM | POA: Diagnosis not present

## 2016-09-21 DIAGNOSIS — Z0000 Encounter for general adult medical examination without abnormal findings: Secondary | ICD-10-CM | POA: Diagnosis not present

## 2016-09-21 DIAGNOSIS — I700 Atherosclerosis of aorta: Secondary | ICD-10-CM | POA: Diagnosis not present

## 2016-09-21 DIAGNOSIS — E782 Mixed hyperlipidemia: Secondary | ICD-10-CM | POA: Diagnosis not present

## 2016-09-21 DIAGNOSIS — M8580 Other specified disorders of bone density and structure, unspecified site: Secondary | ICD-10-CM | POA: Diagnosis not present

## 2016-09-22 ENCOUNTER — Ambulatory Visit
Admission: RE | Admit: 2016-09-22 | Discharge: 2016-09-22 | Disposition: A | Discharge status: Home / Self Care | Payer: PPO | Source: Ambulatory Visit | Attending: Family Medicine | Admitting: Family Medicine

## 2016-09-22 DIAGNOSIS — Z1231 Encounter for screening mammogram for malignant neoplasm of breast: Secondary | ICD-10-CM

## 2016-09-30 ENCOUNTER — Encounter: Payer: Self-pay | Admitting: Gynecology

## 2016-09-30 ENCOUNTER — Ambulatory Visit (INDEPENDENT_AMBULATORY_CARE_PROVIDER_SITE_OTHER): Payer: PPO | Admitting: Gynecology

## 2016-09-30 VITALS — BP 128/82 | Ht 60.5 in | Wt 163.0 lb

## 2016-09-30 DIAGNOSIS — M8580 Other specified disorders of bone density and structure, unspecified site: Secondary | ICD-10-CM

## 2016-09-30 DIAGNOSIS — Z780 Asymptomatic menopausal state: Secondary | ICD-10-CM

## 2016-09-30 DIAGNOSIS — Z01411 Encounter for gynecological examination (general) (routine) with abnormal findings: Secondary | ICD-10-CM

## 2016-09-30 DIAGNOSIS — Z8639 Personal history of other endocrine, nutritional and metabolic disease: Secondary | ICD-10-CM | POA: Insufficient documentation

## 2016-09-30 NOTE — Progress Notes (Signed)
Natalie Fowler 1946-01-04 [id number]   History:    70 y.o.  for annual gyn exam who has no complaints today. Her records indicated she has had history vitamin D deficiency in the past which was corrected and she's currently taking 2000 units daily. 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 She has tolerated  the medicine well without any problems. Patient had a colonoscopy in 2016 and benign polyps were removed and she's currently on a 3 year recall. Her father died of colon cancer at the age of 81. She is on no hormone replacement therapy. Her PCP has been doing her blood work and all her vaccines are up-to-date.  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  Gynecologic History No LMP recorded. Patient is postmenopausal. Contraception: post menopausal status Last Pap: 2014. Results were: normal Last mammogram: 2017. Results were: normal  Obstetric History OB History  Gravida Para Term Preterm AB Living  2 2       2   SAB TAB Ectopic Multiple Live Births               # Outcome Date GA Lbr Len/2nd Weight Sex Delivery Anes PTL Lv  2 Para           1 Para                ROS: A ROS was performed and pertinent positives and negatives are included in the history.  GENERAL: No fevers or chills. HEENT: No change in vision, no earache, sore throat or sinus congestion. NECK:  No pain or stiffness. CARDIOVASCULAR: No chest pain or pressure. No palpitations. PULMONARY: No shortness of breath, cough or wheeze. GASTROINTESTINAL: No abdominal pain, nausea, vomiting or diarrhea, melena or bright red blood per rectum. GENITOURINARY: No urinary frequency, urgency, hesitancy or dysuria. MUSCULOSKELETAL: No joint or muscle pain, no back pain, no recent trauma. DERMATOLOGIC: No rash, no itching, no lesions. ENDOCRINE: No polyuria, polydipsia, no heat or cold intolerance. No recent change in weight. HEMATOLOGICAL: No anemia or easy bruising or bleeding. NEUROLOGIC: No headache, seizures, numbness, tingling or weakness. PSYCHIATRIC: No depression, no loss of interest in normal activity or change in sleep pattern.     Exam: chaperone present  BP 128/82   Ht 5' 0.5" (1.537 m)   Wt 163 lb (73.9 kg)   BMI 31.31 kg/m   Body Fowler index is 31.31 kg/m.  General appearance : Well developed well nourished female. No acute distress HEENT: Eyes: no retinal hemorrhage or exudates,  Neck supple, trachea midline, no carotid bruits, no thyroidmegaly Lungs: Clear to auscultation, no rhonchi or wheezes, or rib retractions  Heart: Regular rate and rhythm, no murmurs or gallops Breast:Examined in sitting and supine position were symmetrical in appearance, no palpable masses or tenderness,  no skin retraction, no nipple inversion, no nipple discharge, no skin discoloration, no axillary or supraclavicular lymphadenopathy Abdomen:  no palpable masses or tenderness, no rebound or guarding Extremities: no edema or skin discoloration or tenderness  Pelvic:  Bartholin, Urethra, Skene Glands: Within normal limits             Vagina: No gross lesions or discharge, atrophic changes  Cervix: No gross lesions or discharge  Uterus  anteverted, normal size, shape and consistency, non-tender and mobile  Adnexa  Without masses or tenderness  Anus and perineum  normal   Rectovaginal  normal sphincter tone  without palpated masses or tenderness             Hemoccult recent colonoscopy     Assessment/Plan:  70 y.o. female for annual exam with history of osteopenia on Evista 60 mg daily doing well. Bone density study will be scheduled for this year. Recommend follow bone density in 2 years and bone mineral density stable she may go on a drug holiday. Because of her history vitamin D deficiency will check her vitamin D level today. Pap smear no longer indicated. Blood work and vaccines provided by her PCP.   Natalie Mass MD, 9:57 AM 09/30/2016

## 2016-09-30 NOTE — Patient Instructions (Signed)

## 2016-10-05 ENCOUNTER — Ambulatory Visit (INDEPENDENT_AMBULATORY_CARE_PROVIDER_SITE_OTHER): Payer: PPO | Admitting: Orthopaedic Surgery

## 2016-10-05 ENCOUNTER — Encounter (INDEPENDENT_AMBULATORY_CARE_PROVIDER_SITE_OTHER): Payer: Self-pay | Admitting: Orthopaedic Surgery

## 2016-10-05 VITALS — BP 110/70 | Resp 14 | Ht 60.0 in | Wt 163.0 lb

## 2016-10-05 DIAGNOSIS — M545 Low back pain, unspecified: Secondary | ICD-10-CM

## 2016-10-05 DIAGNOSIS — M25552 Pain in left hip: Secondary | ICD-10-CM | POA: Diagnosis not present

## 2016-10-05 MED ORDER — METHYLPREDNISOLONE 4 MG PO TBPK: ORAL_TABLET | ORAL | 0 refills | Status: DC

## 2016-10-05 NOTE — Progress Notes (Deleted)
   Office Visit Note   Patient: Natalie Fowler           Date of Birth: May 28, 1946           MRN: [id number] Visit Date: 10/05/2016              Requested by: Lilian Coma, MD [id number], [place] [address] PCP: Lilian Coma., MD   Assessment & Plan: Visit Diagnoses: No diagnosis found.  Plan: ***  Follow-Up Instructions: No Follow-up on file.   Orders:  No orders of the defined types were placed in this encounter.  No orders of the defined types were placed in this encounter.     Procedures: No procedures performed   Clinical Data: No additional findings.   Subjective: No chief complaint on file.   HPI  Review of Systems   Objective: Vital Signs: There were no vitals taken for this visit.  Physical Exam  Ortho Exam  Specialty Comments:  No specialty comments available.  Imaging: No results found.   PMFS History: Patient Active Problem List   Diagnosis Date Noted  . History of vitamin D deficiency 09/30/2016  . H/O colonoscopy with polypectomy 09/30/2015  . Vaginal atrophy 09/25/2014  . Vertigo   . Osteopenia   . Acid reflux    Past Medical History:  Diagnosis Date  . Acid reflux   . Brenner tumor    benign  . Cancer ([place])   . Osteopenia   . Ovarian cyst, bilateral   . Vertigo   . Vitamin D deficiency     Family History  Problem Relation Age of Onset  . Diabetes Mother   . Hypertension Mother   . Cancer Father     COLON CA  . Osteoporosis Sister   . Cancer Maternal Aunt     OVARIAN CANCER  . Heart disease Maternal Aunt   . Hypertension Daughter   . Cancer Paternal Uncle     Colon cancer    Past Surgical History:  Procedure Laterality Date  . CHOLECYSTECTOMY    . CYST EXCISION     CHEST   . OOPHORECTOMY  10/2006   DIAG LAP WITH BSO  . PELVIC LAPAROSCOPY  10/2006   DIAG LAP WITH BSO  . ROTATOR CUFF REPAIR     Social History   Occupational History  . Not on file.   Social History Main  Topics  . Smoking status: Never Smoker  . Smokeless tobacco: Never Used  . Alcohol use No  . Drug use: Unknown  . Sexual activity: Yes    Birth control/ protection: Post-menopausal

## 2016-10-05 NOTE — Progress Notes (Deleted)
   Office Visit Note   Patient: Natalie Fowler           Date of Birth: [birth year]           MRN: [id number] Visit Date: 10/05/2016              Requested by: Lilian Coma, MD [id number], [place] [address] PCP: Lilian Coma., MD   Assessment & Plan: Visit Diagnoses:  1. Pain in left hip   2. Acute left-sided low back pain without sciatica     Plan: ***  Follow-Up Instructions: Return in about 2 weeks (around 10/19/2016) for low back pain.   Orders:  No orders of the defined types were placed in this encounter.  Meds ordered this encounter  Medications  . methylPREDNISolone (MEDROL DOSEPAK) 4 MG TBPK tablet    Sig: Take as directed    Dispense:  21 tablet    Refill:  0      Procedures: No procedures performed   Clinical Data: No additional findings.   Subjective: No chief complaint on file.   HPI  Review of Systems   Objective: Vital Signs: BP 110/70   Resp 14   Ht 5' (1.524 m)   Wt 163 lb (73.9 kg)   BMI 31.83 kg/m   Physical Exam  Ortho Exam  Specialty Comments:  No specialty comments available.  Imaging: No results found.   PMFS History: Patient Active Problem List   Diagnosis Date Noted  . History of vitamin D deficiency 09/30/2016  . H/O colonoscopy with polypectomy 09/30/2015  . Vaginal atrophy 09/25/2014  . Vertigo   . Osteopenia   . Acid reflux    Past Medical History:  Diagnosis Date  . Acid reflux   . Brenner tumor    benign  . Cancer ([place])   . Osteopenia   . Ovarian cyst, bilateral   . Vertigo   . Vitamin D deficiency     Family History  Problem Relation Age of Onset  . Diabetes Mother   . Hypertension Mother   . Cancer Father     COLON CA  . Osteoporosis Sister   . Cancer Maternal Aunt     OVARIAN CANCER  . Heart disease Maternal Aunt   . Hypertension Daughter   . Cancer Paternal Uncle     Colon cancer    Past Surgical History:  Procedure Laterality Date  . CHOLECYSTECTOMY      . CYST EXCISION     CHEST   . OOPHORECTOMY  10/2006   DIAG LAP WITH BSO  . PELVIC LAPAROSCOPY  10/2006   DIAG LAP WITH BSO  . ROTATOR CUFF REPAIR     Social History   Occupational History  . Not on file.   Social History Main Topics  . Smoking status: Never Smoker  . Smokeless tobacco: Never Used  . Alcohol use No  . Drug use: Unknown  . Sexual activity: Yes    Birth control/ protection: Post-menopausal

## 2016-10-05 NOTE — Progress Notes (Signed)
Office Visit Note   Patient: Natalie Fowler           Date of Birth: [birth year]           MRN: [id number] Visit Date: 10/05/2016              Requested by: Lilian Coma, MD [place] [id number] [place], [place] [address] PCP: Lilian Coma., MD   Assessment & Plan: Visit Diagnoses:  1. Pain in left hip   2. Acute left-sided low back pain without sciatica     Plan: medrol dosepack, f/u 2 weeks. She experienced acute onset of pain 3 weeks ago and is "better". She has had a prior episode of back pain many years ago and proved with Medrol dose pack. We will try the same and plan to check her back in 2 weeks and if not improved consider an MRI scan of her lumbar spine. He think her problem is related to her arthritis without evidence of true sciatica.  Follow-Up Instructions: Return in about 2 weeks (around 10/19/2016) for low back pain.   Orders:  No orders of the defined types were placed in this encounter.  Meds ordered this encounter  Medications  . methylPREDNISolone (MEDROL DOSEPAK) 4 MG TBPK tablet    Sig: Take as directed    Dispense:  21 tablet    Refill:  0      Procedures: No procedures performed   Clinical Data: No additional findings.   Subjective: No chief complaint on file.   Pt was doing exercises and 3 weeks ago her left hip started hurting. She went to PCP Dr. Tressa Busman with [facility] in [place].Xrays of lumbar obtained. Pt as had bladder issues nut is being followed by a specialist. Pt is located in lower back radiating to Left hip/bottom Pt has had bone density test recently and does have some bone loss due to taking prilosec    Review of Systems  Constitutional: Negative.   HENT: Negative.   Respiratory: Negative.   Cardiovascular: Negative.   Gastrointestinal: Negative.   Endocrine: Negative.   Genitourinary: Positive for vaginal bleeding.  Musculoskeletal: Negative.   Allergic/Immunologic: Negative.   Neurological: Negative.     Hematological: Negative.   Psychiatric/Behavioral: Negative.   All other systems reviewed and are negative.    Objective: Vital Signs: BP 110/70   Resp 14   Ht 5' (1.524 m)   Wt 163 lb (73.9 kg)   BMI 31.83 kg/m   Physical Exam  Ortho Exam straight leg raise is negative bilaterally. Painless range of motion of both hips and both knees. Neurologically intact distally no swelling distally very minimal percussible tenderness to lumbar spine  at L4-5 and L5-S1. Pelvis appeared to be  level. No specialty comments available.  Imaging: Films of the lumbar spine were reviewed on the [facility] system. There is degenerative disc disease at L4-5 and L5-S1 associated with facet arthropathy.There Was no evidence of a spondylolisthesis. No results found.   PMFS History: Patient Active Problem List   Diagnosis Date Noted  . History of vitamin D deficiency 09/30/2016  . H/O colonoscopy with polypectomy 09/30/2015  . Vaginal atrophy 09/25/2014  . Vertigo   . Osteopenia   . Acid reflux    Past Medical History:  Diagnosis Date  . Acid reflux   . Brenner tumor    benign  . Cancer ([place])   . Osteopenia   . Ovarian cyst, bilateral   . Vertigo   .  Vitamin D deficiency     Family History  Problem Relation Age of Onset  . Diabetes Mother   . Hypertension Mother   . Cancer Father     COLON CA  . Osteoporosis Sister   . Cancer Maternal Aunt     OVARIAN CANCER  . Heart disease Maternal Aunt   . Hypertension Daughter   . Cancer Paternal Uncle     Colon cancer    Past Surgical History:  Procedure Laterality Date  . CHOLECYSTECTOMY    . CYST EXCISION     CHEST   . OOPHORECTOMY  10/2006   DIAG LAP WITH BSO  . PELVIC LAPAROSCOPY  10/2006   DIAG LAP WITH BSO  . ROTATOR CUFF REPAIR     Social History   Occupational History  . Not on file.   Social History Main Topics  . Smoking status: Never Smoker  . Smokeless tobacco: Never Used  . Alcohol use No  . Drug use: Unknown  .  Sexual activity: Yes    Birth control/ protection: Post-menopausal

## 2016-10-19 ENCOUNTER — Ambulatory Visit (INDEPENDENT_AMBULATORY_CARE_PROVIDER_SITE_OTHER): Payer: PPO | Admitting: Orthopaedic Surgery

## 2016-10-19 VITALS — BP 130/80 | HR 70 | Resp 14 | Ht 61.0 in | Wt 163.0 lb

## 2016-10-19 DIAGNOSIS — G8929 Other chronic pain: Secondary | ICD-10-CM

## 2016-10-19 DIAGNOSIS — M5442 Lumbago with sciatica, left side: Secondary | ICD-10-CM

## 2016-10-19 NOTE — Progress Notes (Signed)
   Office Visit Note   Patient: Natalie Fowler           Date of Birth: 1946/08/20           MRN: [id number] Visit Date: 10/19/2016              Requested by: Lilian Coma, MD [place] [id number] [place], [place] [address] PCP: Orpah Melter, MD   Assessment & Plan: Visit Diagnoses: No diagnosis found.  Plan: With minimal response to the Medrol Dosepak I will order an MRI scan of the lumbar spine  Follow-Up Instructions: No Follow-up on file.   Orders:  No orders of the defined types were placed in this encounter.  No orders of the defined types were placed in this encounter.     Procedures: No procedures performed   Clinical Data: No additional findings.   Subjective: No chief complaint on file.   Pain in Left hip area, radiates down to left calf. Lower back pain  Natalie Fowler has had minimal response to the Medrol Dosepak. She continues to have low back pain with occasional radicular discomfort related to her left lower extremity as far distally as her calf. Eyes numbness or tingling. This been no neurologic deficit. She has not experienced right lower extremity pain  Review of Systems   Objective: Vital Signs: There were no vitals taken for this visit.  Physical Exam  Ortho Exam straight leg raise is negative bilaterally. Reflexes are symmetrical. Neurologic exam is intact. There was no percussible tenderness of the lumbar spine.  Specialty Comments:  No specialty comments available.  Imaging: No results found.   PMFS History: Patient Active Problem List   Diagnosis Date Noted  . History of vitamin D deficiency 09/30/2016  . H/O colonoscopy with polypectomy 09/30/2015  . Vaginal atrophy 09/25/2014  . Vertigo   . Osteopenia   . Acid reflux    Past Medical History:  Diagnosis Date  . Acid reflux   . Brenner tumor    benign  . Cancer ([place])   . Osteopenia   . Ovarian cyst, bilateral   . Vertigo   . Vitamin D deficiency     Family  History  Problem Relation Age of Onset  . Diabetes Mother   . Hypertension Mother   . Cancer Father     COLON CA  . Osteoporosis Sister   . Cancer Maternal Aunt     OVARIAN CANCER  . Heart disease Maternal Aunt   . Hypertension Daughter   . Cancer Paternal Uncle     Colon cancer    Past Surgical History:  Procedure Laterality Date  . CHOLECYSTECTOMY    . CYST EXCISION     CHEST   . OOPHORECTOMY  10/2006   DIAG LAP WITH BSO  . PELVIC LAPAROSCOPY  10/2006   DIAG LAP WITH BSO  . ROTATOR CUFF REPAIR     Social History   Occupational History  . Not on file.   Social History Main Topics  . Smoking status: Never Smoker  . Smokeless tobacco: Never Used  . Alcohol use No  . Drug use: Unknown  . Sexual activity: Yes    Birth control/ protection: Post-menopausal

## 2016-11-03 ENCOUNTER — Other Ambulatory Visit: Payer: Self-pay | Admitting: Gynecology

## 2016-11-03 ENCOUNTER — Ambulatory Visit (INDEPENDENT_AMBULATORY_CARE_PROVIDER_SITE_OTHER): Payer: PPO

## 2016-11-03 ENCOUNTER — Telehealth (INDEPENDENT_AMBULATORY_CARE_PROVIDER_SITE_OTHER): Payer: Self-pay | Admitting: *Deleted

## 2016-11-03 DIAGNOSIS — Z8639 Personal history of other endocrine, nutritional and metabolic disease: Secondary | ICD-10-CM

## 2016-11-03 DIAGNOSIS — M8588 Other specified disorders of bone density and structure, other site: Secondary | ICD-10-CM

## 2016-11-03 DIAGNOSIS — M8580 Other specified disorders of bone density and structure, unspecified site: Secondary | ICD-10-CM

## 2016-11-03 DIAGNOSIS — M899 Disorder of bone, unspecified: Secondary | ICD-10-CM | POA: Diagnosis not present

## 2016-11-03 DIAGNOSIS — Z780 Asymptomatic menopausal state: Secondary | ICD-10-CM

## 2016-11-03 NOTE — Telephone Encounter (Signed)
Pt called back and aware of appt 

## 2016-11-03 NOTE — Telephone Encounter (Signed)
Pt has MRI appt scheduled for Dec 16 at 12p, pt is to arrive at 11:30am at [address] Left message on vm to return my call for appt information

## 2016-11-06 ENCOUNTER — Ambulatory Visit (INDEPENDENT_AMBULATORY_CARE_PROVIDER_SITE_OTHER): Payer: PPO | Admitting: Orthopaedic Surgery

## 2016-11-07 ENCOUNTER — Ambulatory Visit (HOSPITAL_COMMUNITY): Payer: PPO

## 2016-11-10 ENCOUNTER — Ambulatory Visit (HOSPITAL_COMMUNITY)
Admission: RE | Admit: 2016-11-10 | Discharge: 2016-11-10 | Disposition: A | Discharge status: Home / Self Care | Payer: PPO | Source: Ambulatory Visit | Attending: Orthopaedic Surgery | Admitting: Orthopaedic Surgery

## 2016-11-10 DIAGNOSIS — M5442 Lumbago with sciatica, left side: Secondary | ICD-10-CM | POA: Insufficient documentation

## 2016-11-10 DIAGNOSIS — G8929 Other chronic pain: Secondary | ICD-10-CM | POA: Diagnosis not present

## 2016-11-10 DIAGNOSIS — M545 Low back pain: Secondary | ICD-10-CM | POA: Diagnosis not present

## 2016-11-10 DIAGNOSIS — M47896 Other spondylosis, lumbar region: Secondary | ICD-10-CM | POA: Diagnosis not present

## 2016-11-13 ENCOUNTER — Telehealth: Payer: Self-pay

## 2016-11-13 NOTE — Telephone Encounter (Signed)
Left detailed message in voice mail per [facility] access note.

## 2016-11-13 NOTE — Telephone Encounter (Signed)
Yes

## 2016-11-13 NOTE — Telephone Encounter (Signed)
Patient said she is on Raolxifene and takes Vit D supplement. She said bone density result said she needs to take Calcium 600 mg and she wanted to be sure she needed to do that since taking Raloxifene.

## 2016-11-19 ENCOUNTER — Ambulatory Visit (INDEPENDENT_AMBULATORY_CARE_PROVIDER_SITE_OTHER): Payer: PPO | Admitting: Orthopaedic Surgery

## 2016-11-19 ENCOUNTER — Encounter (INDEPENDENT_AMBULATORY_CARE_PROVIDER_SITE_OTHER): Payer: Self-pay | Admitting: Orthopaedic Surgery

## 2016-11-19 VITALS — BP 127/83 | HR 87 | Ht 61.0 in | Wt 163.0 lb

## 2016-11-19 DIAGNOSIS — M545 Low back pain, unspecified: Secondary | ICD-10-CM | POA: Insufficient documentation

## 2016-11-19 DIAGNOSIS — M5442 Lumbago with sciatica, left side: Secondary | ICD-10-CM

## 2016-11-19 DIAGNOSIS — G8929 Other chronic pain: Secondary | ICD-10-CM

## 2016-11-19 NOTE — Progress Notes (Signed)
Office Visit Note   Patient: Natalie Fowler           Date of Birth: [birth year]           MRN: [id number] Visit Date: 11/19/2016              Requested by: Orpah Melter, MD [address] [place], [place] [address] PCP: Orpah Melter, MD   Assessment & Plan: Visit Diagnoses:  1. Chronic left-sided low back pain with left-sided sciatica   Presently is not experiencing radicular pain. Plan: Follow-up when necessary. Would consider facet injections if pain exacerbates We will set up physical therapy for 1 visit with instructions on lumbar spine exercises Follow-Up Instructions: Return if symptoms worsen or fail to improve.   Orders:  No orders of the defined types were placed in this encounter.  No orders of the defined types were placed in this encounter.     Procedures: No procedures performed   Clinical Data: No additional findings.   Subjective: Chief Complaint  Patient presents with  . Lower Back - Results    MRI lumbar    HPI Natalie Fowler relates that she is still feeling some back pain but not experiencing the pain in her left lower extremity. She denies bowel or bladder dysfunction. She denies any gastrointestinal problems. Review of Systems   Objective: Vital Signs: BP 127/83   Pulse 87   Ht 5\' 1"  (1.549 m)   Wt 163 lb (73.9 kg)   BMI 30.80 kg/m   Physical Exam  Ortho Exam straight leg raise is negative bilaterally. Neurovascular exam is intact. Reflexes are symmetrical. No percussible pain of the lumbar spine. Painless range of motion of both hips and both knees. Patient walks without a limp.  No specialty comments available.  Imaging: No results found.  MRI scan is ready for review. This was shared with the patient. There is chronic disc degeneration at L4-5 and L5-S1 with an plate osteophytes and bulging of the disks. There is no central canal stenosis. Was mild to moderate foraminal narrowing bilaterally at L5-S1 without neural  compression. Distinct neurocompression was not demonstrated. PMFS History: Patient Active Problem List   Diagnosis Date Noted  . Low back pain 11/19/2016  . History of vitamin D deficiency 09/30/2016  . H/O colonoscopy with polypectomy 09/30/2015  . Vaginal atrophy 09/25/2014  . Vertigo   . Osteopenia   . Acid reflux    Past Medical History:  Diagnosis Date  . Acid reflux   . Brenner tumor    benign  . Cancer ([place])   . Osteopenia   . Ovarian cyst, bilateral   . Vertigo   . Vitamin D deficiency     Family History  Problem Relation Age of Onset  . Diabetes Mother   . Hypertension Mother   . Cancer Father     COLON CA  . Osteoporosis Sister   . Cancer Maternal Aunt     OVARIAN CANCER  . Heart disease Maternal Aunt   . Hypertension Daughter   . Cancer Paternal Uncle     Colon cancer    Past Surgical History:  Procedure Laterality Date  . CHOLECYSTECTOMY    . CYST EXCISION     CHEST   . OOPHORECTOMY  10/2006   DIAG LAP WITH BSO  . PELVIC LAPAROSCOPY  10/2006   DIAG LAP WITH BSO  . ROTATOR CUFF REPAIR     Social History   Occupational History  . Not  on file.   Social History Main Topics  . Smoking status: Never Smoker  . Smokeless tobacco: Never Used  . Alcohol use No  . Drug use: Unknown  . Sexual activity: Yes    Birth control/ protection: Post-menopausal

## 2016-11-30 ENCOUNTER — Ambulatory Visit (INDEPENDENT_AMBULATORY_CARE_PROVIDER_SITE_OTHER): Payer: PPO | Admitting: Orthopaedic Surgery

## 2016-12-23 ENCOUNTER — Telehealth: Payer: Self-pay | Admitting: *Deleted

## 2016-12-23 ENCOUNTER — Other Ambulatory Visit: Payer: Self-pay | Admitting: Gynecology

## 2016-12-23 NOTE — Telephone Encounter (Signed)
Pt takes Evista 60 mg daily for her bones, also takes combination pill 600 mg calcium and 300 mg magnesium. Pt said combination is makes her constipated, has also tried calcium 600 mg and it makes the constipation worse. Pt would like to know what to do? Please advise

## 2016-12-23 NOTE — Telephone Encounter (Signed)
Pt informed with the below note. 

## 2016-12-23 NOTE — Telephone Encounter (Signed)
Please tell her to discontinue the calcium and to take vitamin D3 2000 units daily. Also she can make up the calcium in her diet with dairy products.

## 2017-03-31 DIAGNOSIS — R350 Frequency of micturition: Secondary | ICD-10-CM | POA: Diagnosis not present

## 2017-04-05 DIAGNOSIS — N3011 Interstitial cystitis (chronic) with hematuria: Secondary | ICD-10-CM | POA: Diagnosis not present

## 2017-04-07 ENCOUNTER — Encounter: Payer: Self-pay | Admitting: Gynecology

## 2017-04-26 DIAGNOSIS — N3011 Interstitial cystitis (chronic) with hematuria: Secondary | ICD-10-CM | POA: Diagnosis not present

## 2017-04-26 DIAGNOSIS — R300 Dysuria: Secondary | ICD-10-CM | POA: Diagnosis not present

## 2017-04-26 DIAGNOSIS — R358 Other polyuria: Secondary | ICD-10-CM | POA: Diagnosis not present

## 2017-08-02 DIAGNOSIS — N3011 Interstitial cystitis (chronic) with hematuria: Secondary | ICD-10-CM | POA: Diagnosis not present

## 2017-08-06 ENCOUNTER — Other Ambulatory Visit: Payer: Self-pay | Admitting: Diagnostic Radiology

## 2017-08-06 DIAGNOSIS — Z1239 Encounter for other screening for malignant neoplasm of breast: Secondary | ICD-10-CM

## 2017-09-15 DIAGNOSIS — M12811 Other specific arthropathies, not elsewhere classified, right shoulder: Secondary | ICD-10-CM | POA: Diagnosis not present

## 2017-09-15 DIAGNOSIS — M50823 Other cervical disc disorders at C6-C7 level: Secondary | ICD-10-CM | POA: Diagnosis not present

## 2017-09-15 DIAGNOSIS — M50822 Other cervical disc disorders at C5-C6 level: Secondary | ICD-10-CM | POA: Diagnosis not present

## 2017-09-15 DIAGNOSIS — M50821 Other cervical disc disorders at C4-C5 level: Secondary | ICD-10-CM | POA: Diagnosis not present

## 2017-09-15 DIAGNOSIS — M75101 Unspecified rotator cuff tear or rupture of right shoulder, not specified as traumatic: Secondary | ICD-10-CM | POA: Diagnosis not present

## 2017-09-22 DIAGNOSIS — E782 Mixed hyperlipidemia: Secondary | ICD-10-CM | POA: Diagnosis not present

## 2017-09-22 DIAGNOSIS — R7303 Prediabetes: Secondary | ICD-10-CM | POA: Diagnosis not present

## 2017-09-22 DIAGNOSIS — I700 Atherosclerosis of aorta: Secondary | ICD-10-CM | POA: Diagnosis not present

## 2017-09-22 DIAGNOSIS — Z1159 Encounter for screening for other viral diseases: Secondary | ICD-10-CM | POA: Diagnosis not present

## 2017-09-22 DIAGNOSIS — Z23 Encounter for immunization: Secondary | ICD-10-CM | POA: Diagnosis not present

## 2017-09-22 DIAGNOSIS — Z0000 Encounter for general adult medical examination without abnormal findings: Secondary | ICD-10-CM | POA: Diagnosis not present

## 2017-09-23 ENCOUNTER — Ambulatory Visit
Admission: RE | Admit: 2017-09-23 | Discharge: 2017-09-23 | Disposition: A | Discharge status: Home / Self Care | Payer: PPO | Source: Ambulatory Visit | Attending: Diagnostic Radiology | Admitting: Diagnostic Radiology

## 2017-09-23 ENCOUNTER — Other Ambulatory Visit: Payer: Self-pay | Admitting: Family Medicine

## 2017-09-23 DIAGNOSIS — Z1239 Encounter for other screening for malignant neoplasm of breast: Secondary | ICD-10-CM

## 2017-09-23 DIAGNOSIS — Z1231 Encounter for screening mammogram for malignant neoplasm of breast: Secondary | ICD-10-CM | POA: Diagnosis not present

## 2017-09-27 DIAGNOSIS — M12811 Other specific arthropathies, not elsewhere classified, right shoulder: Secondary | ICD-10-CM | POA: Diagnosis not present

## 2017-09-27 DIAGNOSIS — M75101 Unspecified rotator cuff tear or rupture of right shoulder, not specified as traumatic: Secondary | ICD-10-CM | POA: Diagnosis not present

## 2017-10-11 DIAGNOSIS — M75101 Unspecified rotator cuff tear or rupture of right shoulder, not specified as traumatic: Secondary | ICD-10-CM | POA: Diagnosis not present

## 2017-10-11 DIAGNOSIS — M12811 Other specific arthropathies, not elsewhere classified, right shoulder: Secondary | ICD-10-CM | POA: Diagnosis not present

## 2017-10-18 DIAGNOSIS — M75101 Unspecified rotator cuff tear or rupture of right shoulder, not specified as traumatic: Secondary | ICD-10-CM | POA: Diagnosis not present

## 2017-10-18 DIAGNOSIS — M12811 Other specific arthropathies, not elsewhere classified, right shoulder: Secondary | ICD-10-CM | POA: Diagnosis not present

## 2017-10-22 ENCOUNTER — Ambulatory Visit: Payer: PPO | Admitting: Obstetrics & Gynecology

## 2017-10-22 ENCOUNTER — Encounter: Payer: Self-pay | Admitting: Obstetrics & Gynecology

## 2017-10-22 VITALS — BP 126/84 | Ht 61.0 in | Wt 161.0 lb

## 2017-10-22 DIAGNOSIS — Z01419 Encounter for gynecological examination (general) (routine) without abnormal findings: Secondary | ICD-10-CM

## 2017-10-22 DIAGNOSIS — M8580 Other specified disorders of bone density and structure, unspecified site: Secondary | ICD-10-CM

## 2017-10-22 DIAGNOSIS — Z124 Encounter for screening for malignant neoplasm of cervix: Secondary | ICD-10-CM | POA: Diagnosis not present

## 2017-10-22 DIAGNOSIS — M8589 Other specified disorders of bone density and structure, multiple sites: Secondary | ICD-10-CM

## 2017-10-22 DIAGNOSIS — Z780 Asymptomatic menopausal state: Secondary | ICD-10-CM

## 2017-10-22 MED ORDER — RALOXIFENE HCL 60 MG PO TABS: 60.0000 mg | ORAL_TABLET | Freq: Every day | ORAL | 4 refills | Status: DC

## 2017-10-22 NOTE — Progress Notes (Signed)
Natalie Fowler 1946-08-16 [id number]   History:    71 y.o. G2P2L2  Married  RP:  Established patient presenting for annual gyn exam   HPI: Menopause well-tolerated on no hormone replacement therapy.  No postmenopausal bleeding.  No pelvic pain.  Breasts normal.  Osteopenia on Evista.  Also taking vitamin D supplements 2000 international units/day.  Calcium in nutrition.  Physically active.  Urine and bowel movements normal.  Past medical history,surgical history, family history and social history were all reviewed and documented in the EPIC chart.  Gynecologic History No LMP recorded. Patient is postmenopausal. Contraception: post menopausal status Last Pap: 2014. Results were: normal Last mammogram: 2018. Results were: normal [place] 2 yrs ago Dexa 10/2016 Osteopenia  Obstetric History OB History  Gravida Para Term Preterm AB Living  2 2       2   SAB TAB Ectopic Multiple Live Births               # Outcome Date GA Lbr Len/2nd Weight Sex Delivery Anes PTL Lv  2 Para           1 Para                ROS: A ROS was performed and pertinent positives and negatives are included in the history.  GENERAL: No fevers or chills. HEENT: No change in vision, no earache, sore throat or sinus congestion. NECK: No pain or stiffness. CARDIOVASCULAR: No chest pain or pressure. No palpitations. PULMONARY: No shortness of breath, cough or wheeze. GASTROINTESTINAL: No abdominal pain, nausea, vomiting or diarrhea, melena or bright red blood per rectum. GENITOURINARY: No urinary frequency, urgency, hesitancy or dysuria. MUSCULOSKELETAL: No joint or muscle pain, no back pain, no recent trauma. DERMATOLOGIC: No rash, no itching, no lesions. ENDOCRINE: No polyuria, polydipsia, no heat or cold intolerance. No recent change in weight. HEMATOLOGICAL: No anemia or easy bruising or bleeding. NEUROLOGIC: No headache, seizures, numbness, tingling or weakness. PSYCHIATRIC: No depression, no loss of interest in  normal activity or change in sleep pattern.     Exam:   BP 126/84   Ht 5\' 1"  (1.549 m)   Wt 161 lb (73 kg)   BMI 30.42 kg/m   Body mass index is 30.42 kg/m.  General appearance : Well developed well nourished female. No acute distress HEENT: Eyes: no retinal hemorrhage or exudates,  Neck supple, trachea midline, no carotid bruits, no thyroidmegaly Lungs: Clear to auscultation, no rhonchi or wheezes, or rib retractions  Heart: Regular rate and rhythm, no murmurs or gallops Breast:Examined in sitting and supine position were symmetrical in appearance, no palpable masses or tenderness,  no skin retraction, no nipple inversion, no nipple discharge, no skin discoloration, no axillary or supraclavicular lymphadenopathy Abdomen: no palpable masses or tenderness, no rebound or guarding Extremities: no edema or skin discoloration or tenderness  Pelvic: Vulva normal  Bartholin, Urethra, Skene Glands: Within normal limits             Vagina: No gross lesions or discharge  Cervix: No gross lesions or discharge.  Pap reflex done.  Uterus  AV, normal size, shape and consistency, non-tender and mobile  Adnexa  Without masses or tenderness  Anus and perineum  normal   Assessment/Plan:  71 y.o. female for annual exam   1. Encounter for routine gynecological examination with Papanicolaou smear of cervix Normal gynecologic exam in menopause.  Pap reflex done.  Breast exam normal.  Recent mammogram negative.  Colonoscopy 2 years ago.  Health labs with family physician.  2. Screening for malignant neoplasm of cervix  - Pap IG w/ reflex to HPV when [facility]  3. Menopause present Well on no hormone replacement therapy.  No postmenopausal bleeding.  4. Osteopenia, unspecified location Evista represcribed.  Continue with vitamin D supplements at 2000 international units/day.  Calcium rich nutrition.  Weightbearing physical activity.  Will repeat the bone density in December 2019.  Counseling on  above issues >50% x 10 minutes.  Princess Bruins MD, 9:51 AM 10/22/2017

## 2017-10-22 NOTE — Patient Instructions (Signed)
1. Encounter for routine gynecological examination with Papanicolaou smear of cervix Normal gynecologic exam in menopause.  Pap reflex done.  Breast exam normal.  Recent mammogram negative.  Colonoscopy 2 years ago.  Health labs with family physician.  2. Screening for malignant neoplasm of cervix  - Pap IG w/ reflex to HPV when [facility]  3. Menopause present Well on no hormone replacement therapy.  No postmenopausal bleeding.  4. Osteopenia, unspecified location Evista represcribed.  Continue with vitamin D supplements at 2000 international units/day.  Calcium rich nutrition.  Weightbearing physical activity.  Will repeat the bone density in December 2019.  Natalie Fowler, it was a pleasure meeting you today!  I will inform you of your results as soon as available.   Health Maintenance for Postmenopausal Women Menopause is a normal process in which your reproductive ability comes to an end. This process happens gradually over a span of months to years, usually between the ages of 78 and 63. Menopause is complete when you have missed 12 consecutive menstrual periods. It is important to talk with your health care provider about some of the most common conditions that affect postmenopausal women, such as heart disease, cancer, and bone loss (osteoporosis). Adopting a healthy lifestyle and getting preventive care can help to promote your health and wellness. Those actions can also lower your chances of developing some of these common conditions. What should I know about menopause? During menopause, you may experience a number of symptoms, such as:  Moderate-to-severe hot flashes.  Night sweats.  Decrease in sex drive.  Mood swings.  Headaches.  Tiredness.  Irritability.  Memory problems.  Insomnia.  Choosing to treat or not to treat menopausal changes is an individual decision that you make with your health care provider. What should I know about hormone replacement therapy and  supplements? Hormone therapy products are effective for treating symptoms that are associated with menopause, such as hot flashes and night sweats. Hormone replacement carries certain risks, especially as you become older. If you are thinking about using estrogen or estrogen with progestin treatments, discuss the benefits and risks with your health care provider. What should I know about heart disease and stroke? Heart disease, heart attack, and stroke become more likely as you age. This may be due, in part, to the hormonal changes that your body experiences during menopause. These can affect how your body processes dietary fats, triglycerides, and cholesterol. Heart attack and stroke are both medical emergencies. There are many things that you can do to help prevent heart disease and stroke:  Have your blood pressure checked at least every 1-2 years. High blood pressure causes heart disease and increases the risk of stroke.  If you are 72-48 years old, ask your health care provider if you should take aspirin to prevent a heart attack or a stroke.  Do not use any tobacco products, including cigarettes, chewing tobacco, or electronic cigarettes. If you need help quitting, ask your health care provider.  It is important to eat a healthy diet and maintain a healthy weight. ? Be sure to include plenty of vegetables, fruits, low-fat dairy products, and lean protein. ? Avoid eating foods that are high in solid fats, added sugars, or salt (sodium).  Get regular exercise. This is one of the most important things that you can do for your health. ? Try to exercise for at least 150 minutes each week. The type of exercise that you do should increase your heart rate and make you sweat. This  is known as moderate-intensity exercise. ? Try to do strengthening exercises at least twice each week. Do these in addition to the moderate-intensity exercise.  Know your numbers.Ask your health care provider to check  your cholesterol and your blood glucose. Continue to have your blood tested as directed by your health care provider.  What should I know about cancer screening? There are several types of cancer. Take the following steps to reduce your risk and to catch any cancer development as early as possible. Breast Cancer  Practice breast self-awareness. ? This means understanding how your breasts normally appear and feel. ? It also means doing regular breast self-exams. Let your health care provider know about any changes, no matter how small.  If you are 18 or older, have a clinician do a breast exam (clinical breast exam or CBE) every year. Depending on your age, family history, and medical history, it may be recommended that you also have a yearly breast X-ray (mammogram).  If you have a family history of breast cancer, talk with your health care provider about genetic screening.  If you are at high risk for breast cancer, talk with your health care provider about having an MRI and a mammogram every year.  Breast cancer (BRCA) gene test is recommended for women who have family members with BRCA-related cancers. Results of the assessment will determine the need for genetic counseling and BRCA1 and for BRCA2 testing. BRCA-related cancers include these types: ? Breast. This occurs in males or females. ? Ovarian. ? Tubal. This may also be called fallopian tube cancer. ? Cancer of the abdominal or pelvic lining (peritoneal cancer). ? Prostate. ? Pancreatic.  Cervical, Uterine, and Ovarian Cancer Your health care provider may recommend that you be screened regularly for cancer of the pelvic organs. These include your ovaries, uterus, and vagina. This screening involves a pelvic exam, which includes checking for microscopic changes to the surface of your cervix (Pap test).  For women ages 21-65, health care providers may recommend a pelvic exam and a Pap test every three years. For women ages 42-65,  they may recommend the Pap test and pelvic exam, combined with testing for human papilloma virus (HPV), every five years. Some types of HPV increase your risk of cervical cancer. Testing for HPV may also be done on women of any age who have unclear Pap test results.  Other health care providers may not recommend any screening for nonpregnant women who are considered low risk for pelvic cancer and have no symptoms. Ask your health care provider if a screening pelvic exam is right for you.  If you have had past treatment for cervical cancer or a condition that could lead to cancer, you need Pap tests and screening for cancer for at least 20 years after your treatment. If Pap tests have been discontinued for you, your risk factors (such as having a new sexual partner) need to be reassessed to determine if you should start having screenings again. Some women have medical problems that increase the chance of getting cervical cancer. In these cases, your health care provider may recommend that you have screening and Pap tests more often.  If you have a family history of uterine cancer or ovarian cancer, talk with your health care provider about genetic screening.  If you have vaginal bleeding after reaching menopause, tell your health care provider.  There are currently no reliable tests available to screen for ovarian cancer.  Lung Cancer Lung cancer screening is recommended for  adults 21-34 years old who are at high risk for lung cancer because of a history of smoking. A yearly low-dose CT scan of the lungs is recommended if you:  Currently smoke.  Have a history of at least 30 pack-years of smoking and you currently smoke or have quit within the past 15 years. A pack-year is smoking an average of one pack of cigarettes per day for one year.  Yearly screening should:  Continue until it has been 15 years since you quit.  Stop if you develop a health problem that would prevent you from having lung  cancer treatment.  Colorectal Cancer  This type of cancer can be detected and can often be prevented.  Routine colorectal cancer screening usually begins at age 18 and continues through age 68.  If you have risk factors for colon cancer, your health care provider may recommend that you be screened at an earlier age.  If you have a family history of colorectal cancer, talk with your health care provider about genetic screening.  Your health care provider may also recommend using home test kits to check for hidden blood in your stool.  A small camera at the end of a tube can be used to examine your colon directly (sigmoidoscopy or colonoscopy). This is done to check for the earliest forms of colorectal cancer.  Direct examination of the colon should be repeated every 5-10 years until age 85. However, if early forms of precancerous polyps or small growths are found or if you have a family history or genetic risk for colorectal cancer, you may need to be screened more often.  Skin Cancer  Check your skin from head to toe regularly.  Monitor any moles. Be sure to tell your health care provider: ? About any new moles or changes in moles, especially if there is a change in a mole's shape or color. ? If you have a mole that is larger than the size of a pencil eraser.  If any of your family members has a history of skin cancer, especially at a young age, talk with your health care provider about genetic screening.  Always use sunscreen. Apply sunscreen liberally and repeatedly throughout the day.  Whenever you are outside, protect yourself by wearing long sleeves, pants, a wide-brimmed hat, and sunglasses.  What should I know about osteoporosis? Osteoporosis is a condition in which bone destruction happens more quickly than new bone creation. After menopause, you may be at an increased risk for osteoporosis. To help prevent osteoporosis or the bone fractures that can happen because of  osteoporosis, the following is recommended:  If you are 45-2 years old, get at least 1,000 mg of calcium and at least 600 mg of vitamin D per day.  If you are older than age 31 but younger than age 65, get at least 1,200 mg of calcium and at least 600 mg of vitamin D per day.  If you are older than age 58, get at least 1,200 mg of calcium and at least 800 mg of vitamin D per day.  Smoking and excessive alcohol intake increase the risk of osteoporosis. Eat foods that are rich in calcium and vitamin D, and do weight-bearing exercises several times each week as directed by your health care provider. What should I know about how menopause affects my mental health? Depression may occur at any age, but it is more common as you become older. Common symptoms of depression include:  Low or sad mood.  Changes in sleep patterns.  Changes in appetite or eating patterns.  Feeling an overall lack of motivation or enjoyment of activities that you previously enjoyed.  Frequent crying spells.  Talk with your health care provider if you think that you are experiencing depression. What should I know about immunizations? It is important that you get and maintain your immunizations. These include:  Tetanus, diphtheria, and pertussis (Tdap) booster vaccine.  Influenza every year before the flu season begins.  Pneumonia vaccine.  Shingles vaccine.  Your health care provider may also recommend other immunizations. This information is not intended to replace advice given to you by your health care provider. Make sure you discuss any questions you have with your health care provider. Document Released: 01/01/2006 Document Revised: 05/29/2016 Document Reviewed: 08/13/2015 Elsevier Interactive Patient Education  2018 [employer].

## 2017-10-25 LAB — PAP IG W/ RFLX HPV ASCU

## 2017-10-26 DIAGNOSIS — H00021 Hordeolum internum right upper eyelid: Secondary | ICD-10-CM | POA: Diagnosis not present

## 2017-11-12 DIAGNOSIS — N3010 Interstitial cystitis (chronic) without hematuria: Secondary | ICD-10-CM | POA: Diagnosis not present

## 2018-01-26 DIAGNOSIS — Z20828 Contact with and (suspected) exposure to other viral communicable diseases: Secondary | ICD-10-CM | POA: Diagnosis not present

## 2018-01-26 DIAGNOSIS — E782 Mixed hyperlipidemia: Secondary | ICD-10-CM | POA: Diagnosis not present

## 2018-01-26 DIAGNOSIS — H00011 Hordeolum externum right upper eyelid: Secondary | ICD-10-CM | POA: Diagnosis not present

## 2018-01-26 DIAGNOSIS — Z8249 Family history of ischemic heart disease and other diseases of the circulatory system: Secondary | ICD-10-CM | POA: Diagnosis not present

## 2018-01-28 ENCOUNTER — Other Ambulatory Visit: Payer: Self-pay | Admitting: Family Medicine

## 2018-01-28 DIAGNOSIS — Z8249 Family history of ischemic heart disease and other diseases of the circulatory system: Secondary | ICD-10-CM

## 2018-02-07 ENCOUNTER — Ambulatory Visit
Admission: RE | Admit: 2018-02-07 | Discharge: 2018-02-07 | Disposition: A | Discharge status: Home / Self Care | Payer: PPO | Source: Ambulatory Visit | Attending: Family Medicine | Admitting: Family Medicine

## 2018-02-07 DIAGNOSIS — Z8249 Family history of ischemic heart disease and other diseases of the circulatory system: Secondary | ICD-10-CM

## 2018-02-07 DIAGNOSIS — I6523 Occlusion and stenosis of bilateral carotid arteries: Secondary | ICD-10-CM | POA: Diagnosis not present

## 2018-02-24 DIAGNOSIS — M25861 Other specified joint disorders, right knee: Secondary | ICD-10-CM | POA: Diagnosis not present

## 2018-02-24 DIAGNOSIS — M25561 Pain in right knee: Secondary | ICD-10-CM | POA: Diagnosis not present

## 2018-03-09 DIAGNOSIS — N3011 Interstitial cystitis (chronic) with hematuria: Secondary | ICD-10-CM | POA: Diagnosis not present

## 2018-06-21 ENCOUNTER — Other Ambulatory Visit: Payer: Self-pay | Admitting: Family Medicine

## 2018-06-21 DIAGNOSIS — Z1231 Encounter for screening mammogram for malignant neoplasm of breast: Secondary | ICD-10-CM

## 2018-07-08 DIAGNOSIS — N3011 Interstitial cystitis (chronic) with hematuria: Secondary | ICD-10-CM | POA: Diagnosis not present

## 2018-07-08 DIAGNOSIS — R823 Hemoglobinuria: Secondary | ICD-10-CM | POA: Diagnosis not present

## 2018-07-08 LAB — BASIC METABOLIC PANEL
BUN: 12 (ref 4–21)
CO2: 27 — AB (ref 13–22)
Chloride: 103 (ref 99–108)
Creatinine: 0.9 (ref 0.5–1.1)
Glucose: 97
Potassium: 4.9 (ref 3.4–5.3)
Sodium: 140 (ref 137–147)

## 2018-07-08 LAB — COMPREHENSIVE METABOLIC PANEL: Calcium: 9.4 (ref 8.7–10.7)

## 2018-07-11 DIAGNOSIS — R141 Gas pain: Secondary | ICD-10-CM | POA: Diagnosis not present

## 2018-07-11 DIAGNOSIS — R1084 Generalized abdominal pain: Secondary | ICD-10-CM | POA: Diagnosis not present

## 2018-08-01 DIAGNOSIS — R1084 Generalized abdominal pain: Secondary | ICD-10-CM | POA: Diagnosis not present

## 2018-08-01 DIAGNOSIS — K635 Polyp of colon: Secondary | ICD-10-CM | POA: Diagnosis not present

## 2018-08-01 DIAGNOSIS — R311 Benign essential microscopic hematuria: Secondary | ICD-10-CM | POA: Diagnosis not present

## 2018-08-01 DIAGNOSIS — K5900 Constipation, unspecified: Secondary | ICD-10-CM | POA: Diagnosis not present

## 2018-08-01 DIAGNOSIS — R194 Change in bowel habit: Secondary | ICD-10-CM | POA: Diagnosis not present

## 2018-08-01 DIAGNOSIS — R109 Unspecified abdominal pain: Secondary | ICD-10-CM | POA: Diagnosis not present

## 2018-08-01 DIAGNOSIS — K219 Gastro-esophageal reflux disease without esophagitis: Secondary | ICD-10-CM | POA: Diagnosis not present

## 2018-08-01 DIAGNOSIS — R140 Abdominal distension (gaseous): Secondary | ICD-10-CM | POA: Diagnosis not present

## 2018-08-05 ENCOUNTER — Other Ambulatory Visit: Payer: Self-pay | Admitting: Family Medicine

## 2018-08-05 DIAGNOSIS — R109 Unspecified abdominal pain: Secondary | ICD-10-CM

## 2018-08-05 DIAGNOSIS — R194 Change in bowel habit: Secondary | ICD-10-CM

## 2018-08-12 ENCOUNTER — Ambulatory Visit
Admission: RE | Admit: 2018-08-12 | Discharge: 2018-08-12 | Disposition: A | Discharge status: Home / Self Care | Payer: PPO | Source: Ambulatory Visit | Attending: Family Medicine | Admitting: Family Medicine

## 2018-08-12 DIAGNOSIS — R194 Change in bowel habit: Secondary | ICD-10-CM

## 2018-08-12 DIAGNOSIS — R109 Unspecified abdominal pain: Secondary | ICD-10-CM

## 2018-08-12 DIAGNOSIS — R140 Abdominal distension (gaseous): Secondary | ICD-10-CM | POA: Diagnosis not present

## 2018-08-12 MED ORDER — IOPAMIDOL (ISOVUE-300) INJECTION 61%
100.0000 mL | Freq: Once | INTRAVENOUS | Status: AC | PRN
  Administered 2018-08-12: 100 mL via INTRAVENOUS

## 2018-09-07 DIAGNOSIS — H04123 Dry eye syndrome of bilateral lacrimal glands: Secondary | ICD-10-CM | POA: Diagnosis not present

## 2018-09-07 DIAGNOSIS — H5203 Hypermetropia, bilateral: Secondary | ICD-10-CM | POA: Diagnosis not present

## 2018-09-12 DIAGNOSIS — Z1211 Encounter for screening for malignant neoplasm of colon: Secondary | ICD-10-CM | POA: Diagnosis not present

## 2018-09-12 DIAGNOSIS — Z800 Family history of malignant neoplasm of digestive organs: Secondary | ICD-10-CM | POA: Diagnosis not present

## 2018-09-12 DIAGNOSIS — D123 Benign neoplasm of transverse colon: Secondary | ICD-10-CM | POA: Diagnosis not present

## 2018-09-12 DIAGNOSIS — K5730 Diverticulosis of large intestine without perforation or abscess without bleeding: Secondary | ICD-10-CM | POA: Diagnosis not present

## 2018-09-12 DIAGNOSIS — K648 Other hemorrhoids: Secondary | ICD-10-CM | POA: Diagnosis not present

## 2018-09-12 DIAGNOSIS — Z86010 Personal history of colonic polyps: Secondary | ICD-10-CM | POA: Diagnosis not present

## 2018-09-13 DIAGNOSIS — Z23 Encounter for immunization: Secondary | ICD-10-CM | POA: Diagnosis not present

## 2018-09-26 ENCOUNTER — Ambulatory Visit
Admission: RE | Admit: 2018-09-26 | Discharge: 2018-09-26 | Disposition: A | Discharge status: Home / Self Care | Payer: PPO | Source: Ambulatory Visit | Attending: Family Medicine | Admitting: Family Medicine

## 2018-09-26 DIAGNOSIS — Z1231 Encounter for screening mammogram for malignant neoplasm of breast: Secondary | ICD-10-CM | POA: Diagnosis not present

## 2018-09-29 DIAGNOSIS — K5900 Constipation, unspecified: Secondary | ICD-10-CM | POA: Diagnosis not present

## 2018-09-29 DIAGNOSIS — I700 Atherosclerosis of aorta: Secondary | ICD-10-CM | POA: Diagnosis not present

## 2018-09-29 DIAGNOSIS — Z0000 Encounter for general adult medical examination without abnormal findings: Secondary | ICD-10-CM | POA: Diagnosis not present

## 2018-09-29 DIAGNOSIS — Z1239 Encounter for other screening for malignant neoplasm of breast: Secondary | ICD-10-CM | POA: Diagnosis not present

## 2018-09-29 DIAGNOSIS — Z1211 Encounter for screening for malignant neoplasm of colon: Secondary | ICD-10-CM | POA: Diagnosis not present

## 2018-09-29 DIAGNOSIS — R7303 Prediabetes: Secondary | ICD-10-CM | POA: Diagnosis not present

## 2018-09-29 DIAGNOSIS — R0683 Snoring: Secondary | ICD-10-CM | POA: Diagnosis not present

## 2018-09-29 DIAGNOSIS — K219 Gastro-esophageal reflux disease without esophagitis: Secondary | ICD-10-CM | POA: Diagnosis not present

## 2018-09-29 DIAGNOSIS — E782 Mixed hyperlipidemia: Secondary | ICD-10-CM | POA: Diagnosis not present

## 2018-10-07 DIAGNOSIS — N3010 Interstitial cystitis (chronic) without hematuria: Secondary | ICD-10-CM | POA: Diagnosis not present

## 2018-10-27 ENCOUNTER — Encounter: Payer: Self-pay | Admitting: Obstetrics & Gynecology

## 2018-10-27 ENCOUNTER — Ambulatory Visit (INDEPENDENT_AMBULATORY_CARE_PROVIDER_SITE_OTHER): Payer: PPO | Admitting: Obstetrics & Gynecology

## 2018-10-27 VITALS — BP 136/80 | Ht 61.0 in | Wt 164.0 lb

## 2018-10-27 DIAGNOSIS — Z01419 Encounter for gynecological examination (general) (routine) without abnormal findings: Secondary | ICD-10-CM | POA: Diagnosis not present

## 2018-10-27 DIAGNOSIS — E6609 Other obesity due to excess calories: Secondary | ICD-10-CM

## 2018-10-27 DIAGNOSIS — Z780 Asymptomatic menopausal state: Secondary | ICD-10-CM

## 2018-10-27 DIAGNOSIS — Z6830 Body mass index (BMI) 30.0-30.9, adult: Secondary | ICD-10-CM

## 2018-10-27 DIAGNOSIS — M8589 Other specified disorders of bone density and structure, multiple sites: Secondary | ICD-10-CM

## 2018-10-27 NOTE — Progress Notes (Signed)
Natalie Fowler 06/06/1946 [id number]   History:    72 y.o. G2P2L2 Married  RP:  Established patient presenting for annual gyn exam   HPI: Menopause, well on no hormone replacement therapy.  No postmenopausal bleeding.  No pelvic pain.  Occasionally sexually active with no problem.  Urine normal, no stress urinary incontinence.  Followed by urologist for history of renal cyst.  Bowel movements normal.  Breasts normal.  Last mammogram November 2019 was negative.  Body mass index 30.99.  Not physically active on a regular basis.  Good nutrition with vegetables and fruits.  Health labs with family physician..  Past medical history,surgical history, family history and social history were all reviewed and documented in the EPIC chart.  Gynecologic History No LMP recorded. Patient is postmenopausal. Contraception: post menopausal status Last Pap: 09/2017. Results were: Negative Last mammogram: 09/2018. Results were: Negative Bone Density: 10/2016 Osteopenia.  Will schedule BD here now. Colonoscopy: 08/2018  Obstetric History OB History  Gravida Para Term Preterm AB Living  2 2       2   SAB TAB Ectopic Multiple Live Births               # Outcome Date GA Lbr Len/2nd Weight Sex Delivery Anes PTL Lv  2 Para           1 Para              ROS: A ROS was performed and pertinent positives and negatives are included in the history.  GENERAL: No fevers or chills. HEENT: No change in vision, no earache, sore throat or sinus congestion. NECK: No pain or stiffness. CARDIOVASCULAR: No chest pain or pressure. No palpitations. PULMONARY: No shortness of breath, cough or wheeze. GASTROINTESTINAL: No abdominal pain, nausea, vomiting or diarrhea, melena or bright red blood per rectum. GENITOURINARY: No urinary frequency, urgency, hesitancy or dysuria. MUSCULOSKELETAL: No joint or muscle pain, no back pain, no recent trauma. DERMATOLOGIC: No rash, no itching, no lesions. ENDOCRINE: No polyuria,  polydipsia, no heat or cold intolerance. No recent change in weight. HEMATOLOGICAL: No anemia or easy bruising or bleeding. NEUROLOGIC: No headache, seizures, numbness, tingling or weakness. PSYCHIATRIC: No depression, no loss of interest in normal activity or change in sleep pattern.     Exam:   BP 136/80   Ht 5\' 1"  (1.549 m)   Wt 164 lb (74.4 kg)   BMI 30.99 kg/m   Body mass index is 30.99 kg/m.  General appearance : Well developed well nourished female. No acute distress HEENT: Eyes: no retinal hemorrhage or exudates,  Neck supple, trachea midline, no carotid bruits, no thyroidmegaly Lungs: Clear to auscultation, no rhonchi or wheezes, or rib retractions  Heart: Regular rate and rhythm, no murmurs or gallops Breast:Examined in sitting and supine position were symmetrical in appearance, no palpable masses or tenderness,  no skin retraction, no nipple inversion, no nipple discharge, no skin discoloration, no axillary or supraclavicular lymphadenopathy Abdomen: no palpable masses or tenderness, no rebound or guarding Extremities: no edema or skin discoloration or tenderness  Pelvic: Vulva: Normal             Vagina: No gross lesions or discharge  Cervix: No gross lesions or discharge  Uterus  AV, normal size, shape and consistency, non-tender and mobile  Adnexa  Without masses or tenderness  Anus: Normal   Assessment/Plan:  72 y.o. female for annual exam   1. Well female exam with routine gynecological exam Normal gynecologic exam and  menopause.  Pap test November 2018 was negative.  Will repeat Pap test at 2 to 3 years.  Breast exam normal.  Screening 3D mammogram November 2019 was negative.  Health labs with family physician.  2. Postmenopausal Well on no hormone replacement therapy.  No postmenopausal bleeding.  3. Osteopenia of multiple sites Scheduling bone density here now.  Recommend vitamin D supplements, calcium intake of 1.5 g/day and regular weightbearing physical  activities. - DG Bone Density; Future  4. Class 1 obesity due to excess calories without serious comorbidity with body mass index (BMI) of 30.0 to 30.9 in adult Recommend increasing physical activity with aerobic activities 5 times a week and weightlifting every 2 days.  Continue with healthy nutrition, but could decrease portions in order to decrease calories.  Princess Bruins MD, 10:27 AM 10/27/2018

## 2018-10-27 NOTE — Patient Instructions (Signed)
1. Well female exam with routine gynecological exam Normal gynecologic exam and menopause.  Pap test November 2018 was negative.  Will repeat Pap test at 2 to 3 years.  Breast exam normal.  Screening 3D mammogram November 2019 was negative.  Health labs with family physician.  2. Postmenopausal Well on no hormone replacement therapy.  No postmenopausal bleeding.  3. Osteopenia of multiple sites Scheduling bone density here now.  Recommend vitamin D supplements, calcium intake of 1.5 g/day and regular weightbearing physical activities. - DG Bone Density; Future  4. Class 1 obesity due to excess calories without serious comorbidity with body mass index (BMI) of 30.0 to 30.9 in adult Recommend increasing physical activity with aerobic activities 5 times a week and weightlifting every 2 days.  Continue with healthy nutrition, but could decrease portions in order to decrease calories.  Jorge, it was a pleasure seeing you today!  I will inform you of your bone density results when available.

## 2018-11-09 ENCOUNTER — Ambulatory Visit (INDEPENDENT_AMBULATORY_CARE_PROVIDER_SITE_OTHER): Payer: PPO

## 2018-11-09 DIAGNOSIS — Z6830 Body mass index (BMI) 30.0-30.9, adult: Secondary | ICD-10-CM

## 2018-11-09 DIAGNOSIS — Z780 Asymptomatic menopausal state: Secondary | ICD-10-CM | POA: Diagnosis not present

## 2018-11-09 DIAGNOSIS — M8589 Other specified disorders of bone density and structure, multiple sites: Secondary | ICD-10-CM

## 2018-11-09 DIAGNOSIS — E6609 Other obesity due to excess calories: Secondary | ICD-10-CM

## 2018-11-21 ENCOUNTER — Other Ambulatory Visit: Payer: Self-pay | Admitting: Obstetrics & Gynecology

## 2018-11-21 DIAGNOSIS — Z780 Asymptomatic menopausal state: Secondary | ICD-10-CM

## 2018-11-21 DIAGNOSIS — M8589 Other specified disorders of bone density and structure, multiple sites: Secondary | ICD-10-CM

## 2019-01-03 ENCOUNTER — Other Ambulatory Visit: Payer: Self-pay | Admitting: Obstetrics & Gynecology

## 2019-01-03 NOTE — Telephone Encounter (Signed)
Per dexa on 10/2018 "Bone density osteopenia with a T score of -1.4 at the spine. Significant improvement at the spine since 2017. Continue on Evista."  Rx sent,.

## 2019-01-30 DIAGNOSIS — N3010 Interstitial cystitis (chronic) without hematuria: Secondary | ICD-10-CM | POA: Diagnosis not present

## 2019-07-28 DIAGNOSIS — M25561 Pain in right knee: Secondary | ICD-10-CM | POA: Diagnosis not present

## 2019-07-28 DIAGNOSIS — M238X1 Other internal derangements of right knee: Secondary | ICD-10-CM | POA: Diagnosis not present

## 2019-08-18 ENCOUNTER — Other Ambulatory Visit: Payer: Self-pay | Admitting: Family Medicine

## 2019-08-18 DIAGNOSIS — Z1231 Encounter for screening mammogram for malignant neoplasm of breast: Secondary | ICD-10-CM

## 2019-08-21 DIAGNOSIS — M238X1 Other internal derangements of right knee: Secondary | ICD-10-CM | POA: Diagnosis not present

## 2019-08-21 DIAGNOSIS — M25561 Pain in right knee: Secondary | ICD-10-CM | POA: Diagnosis not present

## 2019-08-21 DIAGNOSIS — M2391 Unspecified internal derangement of right knee: Secondary | ICD-10-CM | POA: Diagnosis not present

## 2019-09-07 DIAGNOSIS — Z23 Encounter for immunization: Secondary | ICD-10-CM | POA: Diagnosis not present

## 2019-09-13 DIAGNOSIS — H5203 Hypermetropia, bilateral: Secondary | ICD-10-CM | POA: Diagnosis not present

## 2019-09-13 DIAGNOSIS — H25813 Combined forms of age-related cataract, bilateral: Secondary | ICD-10-CM | POA: Diagnosis not present

## 2019-09-13 DIAGNOSIS — H04123 Dry eye syndrome of bilateral lacrimal glands: Secondary | ICD-10-CM | POA: Diagnosis not present

## 2019-09-27 DIAGNOSIS — M25561 Pain in right knee: Secondary | ICD-10-CM | POA: Diagnosis not present

## 2019-09-27 DIAGNOSIS — M2391 Unspecified internal derangement of right knee: Secondary | ICD-10-CM | POA: Diagnosis not present

## 2019-09-27 DIAGNOSIS — M2241 Chondromalacia patellae, right knee: Secondary | ICD-10-CM | POA: Diagnosis not present

## 2019-10-02 DIAGNOSIS — Z13220 Encounter for screening for lipoid disorders: Secondary | ICD-10-CM | POA: Diagnosis not present

## 2019-10-02 DIAGNOSIS — Z1211 Encounter for screening for malignant neoplasm of colon: Secondary | ICD-10-CM | POA: Diagnosis not present

## 2019-10-02 DIAGNOSIS — R7303 Prediabetes: Secondary | ICD-10-CM | POA: Diagnosis not present

## 2019-10-02 DIAGNOSIS — E782 Mixed hyperlipidemia: Secondary | ICD-10-CM | POA: Diagnosis not present

## 2019-10-02 DIAGNOSIS — Z0000 Encounter for general adult medical examination without abnormal findings: Secondary | ICD-10-CM | POA: Diagnosis not present

## 2019-10-02 DIAGNOSIS — Z6831 Body mass index (BMI) 31.0-31.9, adult: Secondary | ICD-10-CM | POA: Diagnosis not present

## 2019-10-02 DIAGNOSIS — Z1231 Encounter for screening mammogram for malignant neoplasm of breast: Secondary | ICD-10-CM | POA: Diagnosis not present

## 2019-10-02 DIAGNOSIS — Z13820 Encounter for screening for osteoporosis: Secondary | ICD-10-CM | POA: Diagnosis not present

## 2019-10-02 LAB — BASIC METABOLIC PANEL
BUN: 12 (ref 4–21)
CO2: 28 — AB (ref 13–22)
Chloride: 104 (ref 99–108)
Creatinine: 0.8 (ref 0.5–1.1)
Glucose: 112
Potassium: 4.3 (ref 3.4–5.3)
Sodium: 140 (ref 137–147)

## 2019-10-02 LAB — CBC: RBC: 4.63 (ref 3.87–5.11)

## 2019-10-02 LAB — LIPID PANEL
Cholesterol: 208 — AB (ref 0–200)
HDL: 44 (ref 35–70)
LDL Cholesterol: 106
Triglycerides: 124 (ref 40–160)

## 2019-10-02 LAB — CBC AND DIFFERENTIAL
HCT: 41 (ref 36–46)
Hemoglobin: 13.5 (ref 12.0–16.0)
Platelets: 222 (ref 150–399)
WBC: 5

## 2019-10-02 LAB — HEMOGLOBIN A1C: Hemoglobin A1C: 6.4

## 2019-10-02 LAB — MICROALBUMIN, URINE: Microalb, Ur: 0.96

## 2019-10-02 LAB — COMPREHENSIVE METABOLIC PANEL
Calcium: 9.2 (ref 8.7–10.7)
GFR calc non Af Amer: 0.79

## 2019-10-03 ENCOUNTER — Ambulatory Visit
Admission: RE | Admit: 2019-10-03 | Discharge: 2019-10-03 | Disposition: A | Discharge status: Home / Self Care | Payer: PPO | Source: Ambulatory Visit | Attending: Family Medicine | Admitting: Family Medicine

## 2019-10-03 DIAGNOSIS — Z1231 Encounter for screening mammogram for malignant neoplasm of breast: Secondary | ICD-10-CM | POA: Diagnosis not present

## 2019-10-07 DIAGNOSIS — M25561 Pain in right knee: Secondary | ICD-10-CM | POA: Diagnosis not present

## 2019-10-13 DIAGNOSIS — M25561 Pain in right knee: Secondary | ICD-10-CM | POA: Diagnosis not present

## 2019-10-27 ENCOUNTER — Other Ambulatory Visit: Payer: Self-pay

## 2019-10-30 ENCOUNTER — Encounter: Payer: Self-pay | Admitting: Obstetrics & Gynecology

## 2019-10-30 ENCOUNTER — Ambulatory Visit (INDEPENDENT_AMBULATORY_CARE_PROVIDER_SITE_OTHER): Payer: PPO | Admitting: Obstetrics & Gynecology

## 2019-10-30 ENCOUNTER — Other Ambulatory Visit: Payer: Self-pay

## 2019-10-30 VITALS — BP 140/88 | Ht 61.0 in | Wt 169.0 lb

## 2019-10-30 DIAGNOSIS — Z6831 Body mass index (BMI) 31.0-31.9, adult: Secondary | ICD-10-CM

## 2019-10-30 DIAGNOSIS — Z01419 Encounter for gynecological examination (general) (routine) without abnormal findings: Secondary | ICD-10-CM

## 2019-10-30 DIAGNOSIS — E6609 Other obesity due to excess calories: Secondary | ICD-10-CM

## 2019-10-30 DIAGNOSIS — R319 Hematuria, unspecified: Secondary | ICD-10-CM

## 2019-10-30 DIAGNOSIS — Z780 Asymptomatic menopausal state: Secondary | ICD-10-CM

## 2019-10-30 DIAGNOSIS — M8589 Other specified disorders of bone density and structure, multiple sites: Secondary | ICD-10-CM

## 2019-10-30 NOTE — Progress Notes (Signed)
Natalie Fowler 1946/03/17 [id number]   History:    73 y.o. G2P2L2 Married  RP:  Established patient presenting for annual gyn exam   HPI: Menopause, well on no hormone replacement therapy.  No postmenopausal bleeding.  No pelvic pain.  Occasionally sexually active with no problem.  Urine normal, no stress urinary incontinence.  Followed by urologist for history of renal cyst. Bowel movements normal.  Breasts normal.  Last mammogram November 2019 was negative.  Body mass index 31.93.  Not physically active on a regular basis. Rt knee torn meniscus.  Good nutrition with vegetables and fruits.  Health labs with family physician..   Past medical history,surgical history, family history and social history were all reviewed and documented in the EPIC chart.  Gynecologic History No LMP recorded. Patient is postmenopausal. Contraception: post menopausal status Last Pap: 09/2017. Results were: Negative Last mammogram: 09/2019. Results were: Negative Bone Density: 10/2018 Osteopenia Colonoscopy: 08/2018  Obstetric History OB History  Gravida Para Term Preterm AB Living  2 2       2   SAB TAB Ectopic Multiple Live Births               # Outcome Date GA Lbr Len/2nd Weight Sex Delivery Anes PTL Lv  2 Para           1 Para              ROS: A ROS was performed and pertinent positives and negatives are included in the history.  GENERAL: No fevers or chills. HEENT: No change in vision, no earache, sore throat or sinus congestion. NECK: No pain or stiffness. CARDIOVASCULAR: No chest pain or pressure. No palpitations. PULMONARY: No shortness of breath, cough or wheeze. GASTROINTESTINAL: No abdominal pain, nausea, vomiting or diarrhea, melena or bright red blood per rectum. GENITOURINARY: No urinary frequency, urgency, hesitancy or dysuria. MUSCULOSKELETAL: No joint or muscle pain, no back pain, no recent trauma. DERMATOLOGIC: No rash, no itching, no lesions. ENDOCRINE: No polyuria, polydipsia,  no heat or cold intolerance. No recent change in weight. HEMATOLOGICAL: No anemia or easy bruising or bleeding. NEUROLOGIC: No headache, seizures, numbness, tingling or weakness. PSYCHIATRIC: No depression, no loss of interest in normal activity or change in sleep pattern.     Exam:   BP 140/88   Ht 5' 1"  (1.549 m)   Wt 169 lb (76.7 kg)   BMI 31.93 kg/m   Body mass index is 31.93 kg/m.  General appearance : Well developed well nourished female. No acute distress HEENT: Eyes: no retinal hemorrhage or exudates,  Neck supple, trachea midline, no carotid bruits, no thyroidmegaly Lungs: Clear to auscultation, no rhonchi or wheezes, or rib retractions  Heart: Regular rate and rhythm, no murmurs or gallops Breast:Examined in sitting and supine position were symmetrical in appearance, no palpable masses or tenderness,  no skin retraction, no nipple inversion, no nipple discharge, no skin discoloration, no axillary or supraclavicular lymphadenopathy Abdomen: no palpable masses or tenderness, no rebound or guarding Extremities: no edema or skin discoloration or tenderness  Pelvic: Vulva: Normal             Vagina: No gross lesions or discharge  Cervix: No gross lesions or discharge  Uterus  AV, normal size, shape and consistency, non-tender and mobile  Adnexa  Without masses or tenderness  Anus: Normal  U/A: Yellow clear, protein negative, nitrites negative, white blood cells 6-10, red blood cells 3-10, few bacteria, no casts.  Urine culture pending.  Assessment/Plan:  73 y.o. female for annual exam   1. Well female exam with routine gynecological exam Normal gynecologic exam.  Pap test November 2018 was negative, no indication to repeat a Pap test this year.  Breast exam normal.  Screening mammogram November 2020 was negative.  Colonoscopy October 2019.  Fasting health labs here today. - CBC - Comp Met (CMET) - TSH - Lipid panel - VITAMIN D 25 Hydroxy (Vit-D Deficiency, Fractures)   2. Hematuria, unspecified type History of hematuria.  No symptoms of urinary tract infection.  Urine analysis with 1+ blood, red blood cells 3-10.  Probably stable.  Mild perturbation of urine analysis, probably no acute cystitis.  Will wait on urine culture to decide if any treatment is needed. - Urinalysis,Complete w/RFL Culture  3. Postmenopausal Well on no hormone replacement therapy.  No postmenopausal bleeding.  4. Osteopenia of multiple sites Osteopenia on bone density December 2019.  Vitamin D supplements, calcium intake of 1200 mg daily and regular weightbearing physical activities.  5. Class 1 obesity due to excess calories without serious comorbidity with body mass index (BMI) of 31.0 to 31.9 in adult Low calorie/carb diet such as [employer].  Recommend aerobic activities 5 times a week after right knee surgery is done and light weightlifting every 2 days.  Other orders - Multiple Vitamin (MULTIVITAMIN) tablet; Take 1 tablet by mouth daily. - glucosamine-chondroitin 500-400 MG tablet; Take 1 tablet by mouth 3 (three) times daily.  Counseling on above issues and coordination of care more than 50% for 10 minutes.  Princess Bruins MD, 2:50 PM 10/30/2019

## 2019-10-30 NOTE — Patient Instructions (Signed)
1. Well female exam with routine gynecological exam Normal gynecologic exam.  Pap test November 2018 was negative, no indication to repeat a Pap test this year.  Breast exam normal.  Screening mammogram November 2020 was negative.  Colonoscopy October 2019.  Fasting health labs here today. - CBC - Comp Met (CMET) - TSH - Lipid panel - VITAMIN D 25 Hydroxy (Vit-D Deficiency, Fractures)  2. Hematuria, unspecified type History of hematuria.  No symptoms of urinary tract infection.  Urine analysis with 1+ blood, red blood cells 3-10.  Probably stable.  Mild perturbation of urine analysis, probably no acute cystitis.  Will wait on urine culture to decide if any treatment is needed. - Urinalysis,Complete w/RFL Culture  3. Postmenopausal Well on no hormone replacement therapy.  No postmenopausal bleeding.  4. Osteopenia of multiple sites Osteopenia on bone density December 2019.  Vitamin D supplements, calcium intake of 1200 mg daily and regular weightbearing physical activities.  5. Class 1 obesity due to excess calories without serious comorbidity with body mass index (BMI) of 31.0 to 31.9 in adult Low calorie/carb diet such as [employer].  Recommend aerobic activities 5 times a week after right knee surgery is done and light weightlifting every 2 days.  Other orders - Multiple Vitamin (MULTIVITAMIN) tablet; Take 1 tablet by mouth daily. - glucosamine-chondroitin 500-400 MG tablet; Take 1 tablet by mouth 3 (three) times daily.  Chevonne, it was a pleasure seeing you today!  I will inform you of your results as soon as they are available.

## 2019-10-31 LAB — COMPREHENSIVE METABOLIC PANEL
AG Ratio: 1.7 (calc) (ref 1.0–2.5)
ALT: 25 U/L (ref 6–29)
AST: 17 U/L (ref 10–35)
Albumin: 4.3 g/dL (ref 3.6–5.1)
Alkaline phosphatase (APISO): 52 U/L (ref 37–153)
BUN: 14 mg/dL (ref 7–25)
CO2: 26 mmol/L (ref 20–32)
Calcium: 9.7 mg/dL (ref 8.6–10.4)
Chloride: 105 mmol/L (ref 98–110)
Creat: 0.84 mg/dL (ref 0.60–0.93)
Globulin: 2.5 g/dL (calc) (ref 1.9–3.7)
Glucose, Bld: 94 mg/dL (ref 65–99)
Potassium: 4.4 mmol/L (ref 3.5–5.3)
Sodium: 141 mmol/L (ref 135–146)
Total Bilirubin: 0.4 mg/dL (ref 0.2–1.2)
Total Protein: 6.8 g/dL (ref 6.1–8.1)

## 2019-10-31 LAB — LIPID PANEL
Cholesterol: 195 mg/dL (ref <200)
HDL: 42 mg/dL — ABNORMAL LOW (ref >=50)
LDL Cholesterol (Calc): 120 mg/dL (calc) — ABNORMAL HIGH
Non-HDL Cholesterol (Calc): 153 mg/dL (calc) — ABNORMAL HIGH (ref <130)
Total CHOL/HDL Ratio: 4.6 (calc) (ref <5.0)
Triglycerides: 212 mg/dL — ABNORMAL HIGH (ref <150)

## 2019-10-31 LAB — CBC
HCT: 42.4 % (ref 35.0–45.0)
Hemoglobin: 14.2 g/dL (ref 11.7–15.5)
MCH: 29.7 pg (ref 27.0–33.0)
MCHC: 33.5 g/dL (ref 32.0–36.0)
MCV: 88.7 fL (ref 80.0–100.0)
MPV: 12.9 fL — ABNORMAL HIGH (ref 7.5–12.5)
Platelets: 186 10*3/uL (ref 140–400)
RBC: 4.78 10*6/uL (ref 3.80–5.10)
RDW: 12.6 % (ref 11.0–15.0)
WBC: 8.2 10*3/uL (ref 3.8–10.8)

## 2019-10-31 LAB — TSH: TSH: 1.1 mIU/L (ref 0.40–4.50)

## 2019-10-31 LAB — VITAMIN D 25 HYDROXY (VIT D DEFICIENCY, FRACTURES): Vit D, 25-Hydroxy: 37 ng/mL (ref 30–100)

## 2019-11-01 LAB — URINALYSIS, COMPLETE W/RFL CULTURE
Bilirubin Urine: NEGATIVE
Glucose, UA: NEGATIVE
Hyaline Cast: NONE SEEN /LPF
Ketones, ur: NEGATIVE
Nitrites, Initial: NEGATIVE
Protein, ur: NEGATIVE
Specific Gravity, Urine: 1.025 (ref 1.001–1.03)
pH: 5 (ref 5.0–8.0)

## 2019-11-01 LAB — CULTURE INDICATED

## 2019-11-01 LAB — URINE CULTURE
MICRO NUMBER:: 1170335
Result:: NO GROWTH
SPECIMEN QUALITY:: ADEQUATE

## 2019-11-06 ENCOUNTER — Telehealth: Payer: Self-pay | Admitting: *Deleted

## 2019-11-06 MED ORDER — RALOXIFENE HCL 60 MG PO TABS: 60.0000 mg | ORAL_TABLET | Freq: Every day | ORAL | 4 refills | Status: DC

## 2019-11-06 NOTE — Telephone Encounter (Signed)
Patient had annual exam on 10/30/19 and forgot to ask for refill for Evist 60 mg tablet. Okay to refill x 1 year?

## 2019-11-06 NOTE — Telephone Encounter (Signed)
Rx sent 

## 2019-11-06 NOTE — Telephone Encounter (Signed)
Yes, refill Evista x 1 year.

## 2019-12-05 DIAGNOSIS — H00024 Hordeolum internum left upper eyelid: Secondary | ICD-10-CM | POA: Diagnosis not present

## 2019-12-23 ENCOUNTER — Ambulatory Visit: Payer: PPO

## 2019-12-28 ENCOUNTER — Ambulatory Visit: Payer: PPO

## 2019-12-29 ENCOUNTER — Ambulatory Visit: Payer: PPO | Attending: Internal Medicine

## 2019-12-29 DIAGNOSIS — Z23 Encounter for immunization: Secondary | ICD-10-CM | POA: Insufficient documentation

## 2019-12-29 NOTE — Progress Notes (Signed)
   Covid-19 Vaccination Clinic  Name:  Natalie Fowler    MRN: [id number] DOB: 1946/08/22  12/29/2019  Natalie Fowler was observed post Covid-19 immunization for 15 minutes without incidence. She was provided with Vaccine Information Sheet and instruction to access the V-Safe system.   Natalie Fowler was instructed to call 911 with any severe reactions post vaccine: Marland Kitchen Difficulty breathing  . Swelling of your face and throat  . A fast heartbeat  . A bad rash all over your body  . Dizziness and weakness    Immunizations Administered    Name Date Dose VIS Date Route   Pfizer COVID-19 Vaccine 12/29/2019 11:43 AM 0.3 mL 11/03/2019 Intramuscular   Manufacturer: [place]   Lot: [id number]   [place]: [id number]

## 2020-01-23 ENCOUNTER — Ambulatory Visit: Payer: PPO | Attending: Internal Medicine

## 2020-01-23 DIAGNOSIS — Z23 Encounter for immunization: Secondary | ICD-10-CM | POA: Insufficient documentation

## 2020-01-23 NOTE — Progress Notes (Signed)
   Covid-19 Vaccination Clinic  Name:  Roby Mckinzey    MRN: [id number] DOB: 02-Jun-1946  01/23/2020  Ms. Bynum was observed post Covid-19 immunization for 15 minutes without incident. She was provided with Vaccine Information Sheet and instruction to access the V-Safe system.   Ms. Depaola was instructed to call 911 with any severe reactions post vaccine: Marland Kitchen Difficulty breathing  . Swelling of face and throat  . A fast heartbeat  . A bad rash all over body  . Dizziness and weakness   Immunizations Administered    Name Date Dose VIS Date Route   Pfizer COVID-19 Vaccine 01/23/2020 11:28 AM 0.3 mL 11/03/2019 Intramuscular   Manufacturer: [place]   Lot: [id number]   [place]: [id number]

## 2020-03-14 ENCOUNTER — Telehealth: Payer: Self-pay

## 2020-03-14 NOTE — Telephone Encounter (Signed)
Is she family to an existing patient?

## 2020-03-14 NOTE — Telephone Encounter (Signed)
No

## 2020-03-14 NOTE — Telephone Encounter (Signed)
Paient is needing to know if she can become a new patient with DR. Coplan please advise.

## 2020-03-15 DIAGNOSIS — T162XXA Foreign body in left ear, initial encounter: Secondary | ICD-10-CM | POA: Diagnosis not present

## 2020-03-25 ENCOUNTER — Telehealth: Payer: Self-pay | Admitting: General Practice

## 2020-03-25 NOTE — Telephone Encounter (Signed)
Sure. I will see her

## 2020-03-25 NOTE — Telephone Encounter (Addendum)
Patient is requesting to become a New  Patient of [facility], please advise. She is friends with [facility] patient.    Pt advised that she is not accepting new patient.    Patient's friend is Marshall Cork.

## 2020-03-25 NOTE — Telephone Encounter (Signed)
Who is the friend that referred to her? I will send to Dr. Lorelei Pont

## 2020-03-25 NOTE — Telephone Encounter (Signed)
Ok to schedule as new patient   

## 2020-04-04 NOTE — Patient Instructions (Addendum)
It was great to meet you today!  You might try an H2 blocker for your night time cough - like Pepcid or Tagament OTC  I will request records from [facility] and Dr. Delfino Lovett Puschinsky for you  We could consider doing genetic testing for you with your family history of pancreatic and colon cancers- let me know if you are interested and I can refer you!  I will be in touch with your labs and will give your best to Dr Marin Comment

## 2020-04-04 NOTE — Progress Notes (Addendum)
[place] at [facility] [place], [place], [place] [address] [phone]  Date:  04/08/2020   Name:  Natalie Fowler   DOB:  04-09-1946   MRN:  [id number]  PCP:  Darreld Mclean, MD    Chief Complaint: New Patient (Initial Visit) (colonoscopy every 3 years) and Cough (coughin at night-no other symptoms, going on for months)   History of Present Illness:  Embry Linwood is a 74 y.o. very pleasant female patient who presents with the following:  Here today as a new patient to establish care Her GYN is Dr. Dellis Filbert-  Her mammo is UTD She has a urologist as well- Dr. Delfino Lovett Puschinsky, bladder and kidney cyst GI is [facility] Sit- they do her scope every 3 years due to family history of colon cancer (dad) Her ortho is [facility] Dame- they are looking at her knee issue right now-they are considering a partial knee replacement  COVID-19 series is complete She has [facility] She did get her pneumonia vaccines per [facility] previously  Her previous PCP was Dr. Marin Comment who is no longer doing primary care  She lives in [place], in this area for about 33 years  Married with 2 children and 5 grands Her son and daughter are both local- her grands are 47, 67, 17, 6, 5 She is retired from her work as a Market researcher   She is a never smoker, does not drink alcohol  She occasionally notes a cough at night, she thinks due to GERD She no longer wishes to take PPI due to osteoporosis-I suggested that she try an H2 blocker  Last dexa 10/2018  She does note a family history of pancreatic and colon cancers.  No one has had genetic testing yet that she is aware of  Patient Active Problem List   Diagnosis Date Noted  . Low back pain 11/19/2016  . History of vitamin D deficiency 09/30/2016  . H/O colonoscopy with polypectomy 09/30/2015  . Vaginal atrophy 09/25/2014  . Vertigo   . Osteopenia   . Acid reflux      Past Medical History:  Diagnosis Date  . Acid reflux   . Brenner tumor    benign  . Cancer ([place])   . Osteopenia   . Ovarian cyst, bilateral   . Vertigo   . Vitamin D deficiency     Past Surgical History:  Procedure Laterality Date  . CHOLECYSTECTOMY    . CYST EXCISION     CHEST   . OOPHORECTOMY  10/2006   DIAG LAP WITH BSO  . PELVIC LAPAROSCOPY  10/2006   DIAG LAP WITH BSO  . ROTATOR CUFF REPAIR      Social History   Tobacco Use  . Smoking status: Never Smoker  . Smokeless tobacco: Never Used  Substance Use Topics  . Alcohol use: No    Alcohol/week: 0.0 standard drinks  . Drug use: Not on file    Family History  Problem Relation Age of Onset  . Diabetes Mother   . Hypertension Mother   . Cancer Father        COLON CA  . Osteoporosis Sister   . Cancer Sister 43       PANCREATIC  . Cancer Maternal Aunt        OVARIAN CANCER  . Heart disease Maternal Aunt   . Hypertension Daughter   . Cancer Paternal Uncle  Colon cancer  . Breast cancer Neg Hx     Allergies  Allergen Reactions  . Vioxx [Rofecoxib] Rash    Medication list has been reviewed and updated.  Current Outpatient Medications on File Prior to Visit  Medication Sig Dispense Refill  . cholecalciferol (VITAMIN D) 1000 units tablet Take 2,000 Units by mouth daily.     . Multiple Vitamin (MULTIVITAMIN) tablet Take 1 tablet by mouth daily.    . raloxifene (EVISTA) 60 MG tablet Take 1 tablet (60 mg total) by mouth daily. 90 tablet 4   No current facility-administered medications on file prior to visit.    Review of Systems:  As per HPI- otherwise negative. No chest pain or unusual shortness of breath  Physical Examination: Vitals:   04/08/20 0931  BP: 132/81  Pulse: 92  Resp: 17  Temp: 98.3 F (36.8 C)  SpO2: 98%   Vitals:   04/08/20 0931  Weight: 169 lb (76.7 kg)  Height: 5' 0.25" (1.53 m)   Body mass index is 32.73 kg/m. Ideal Body Weight: Weight in (lb) to have BMI  = 25: 128.8  GEN: no acute distress.  Overweight, otherwise looks well HEENT: Atraumatic, Normocephalic.  Ears and Nose: No external deformity. CV: RRR, No M/G/R. No JVD. No thrill. No extra heart sounds. PULM: CTA B, no wheezes, crackles, rhonchi. No retractions. No resp. distress. No accessory muscle use. ABD: S, NT, ND. No rebound. No HSM. EXTR: No c/c/e PSYCH: Normally interactive. Conversant.    Assessment and Plan: Hyperlipidemia, unspecified hyperlipidemia type - Plan: Lipid panel  Screening for diabetes mellitus - Plan: Comprehensive metabolic panel, Hemoglobin A1c  Screening for deficiency anemia - Plan: CBC  Family history of pancreatic cancer - Plan: Amylase  Encounter for medical examination to establish care  Gastroesophageal reflux disease, unspecified whether esophagitis present   New patient here today to establish care.  She is generally in good health, does have osteoporosis and per recent labs dyslipidemia.  I will obtain lab work for today, request records from [facility] and from her urologist.  We discussed genetic testing today for family history of multiple cancers.  She would like to think about this, but may end up requesting a referral.  She has a cough at night from likely GERD, I suggested that she try an H2 blocker as needed Moderate medical decision making today This visit occurred during the SARS-CoV-2 public health emergency.  Safety protocols were in place, including screening questions prior to the visit, additional usage of staff PPE, and extensive cleaning of exam room while observing appropriate contact time as indicated for disinfecting solutions.   Signed Lamar Blinks, MD  Received her labs as below- letter to pt  Results for orders placed or performed in visit on 04/08/20  CBC  Result Value Ref Range   WBC 7.4 4.0 - 10.5 K/uL   RBC 4.70 3.87 - 5.11 Mil/uL   Platelets 164.0 150.0 - 400.0 K/uL   Hemoglobin 14.1 12.0 - 15.0 g/dL    HCT 41.6 36.0 - 46.0 %   MCV 88.5 78.0 - 100.0 fl   MCHC 33.9 30.0 - 36.0 g/dL   RDW 13.4 11.5 - 15.5 %  Comprehensive metabolic panel  Result Value Ref Range   Sodium 139 135 - 145 mEq/L   Potassium 4.4 3.5 - 5.1 mEq/L   Chloride 105 96 - 112 mEq/L   CO2 27 19 - 32 mEq/L   Glucose, Bld 177 (H) 70 - 99 mg/dL  BUN 16 6 - 23 mg/dL   Creatinine, Ser 0.84 0.40 - 1.20 mg/dL   Total Bilirubin 0.3 0.2 - 1.2 mg/dL   Alkaline Phosphatase 48 39 - 117 U/L   AST 18 0 - 37 U/L   ALT 28 0 - 35 U/L   Total Protein 6.2 6.0 - 8.3 g/dL   Albumin 4.1 3.5 - 5.2 g/dL   GFR 66.64 >60.00 mL/min   Calcium 9.4 8.4 - 10.5 mg/dL  Hemoglobin A1c  Result Value Ref Range   Hgb A1c MFr Bld 6.5 4.6 - 6.5 %  Lipid panel  Result Value Ref Range   Cholesterol 188 0 - 200 mg/dL   Triglycerides (H) 0.0 - 149.0 mg/dL    401.0 Triglyceride is over 400; calculations on Lipids are invalid.   HDL 37.10 (L) >39.00 mg/dL   Total CHOL/HDL Ratio 5   Amylase  Result Value Ref Range   Amylase 43 27 - 131 U/L  LDL cholesterol, direct  Result Value Ref Range   Direct LDL 98.0 mg/dL

## 2020-04-08 ENCOUNTER — Ambulatory Visit (INDEPENDENT_AMBULATORY_CARE_PROVIDER_SITE_OTHER): Payer: PPO | Admitting: Family Medicine

## 2020-04-08 ENCOUNTER — Encounter: Payer: Self-pay | Admitting: Family Medicine

## 2020-04-08 ENCOUNTER — Other Ambulatory Visit: Payer: Self-pay

## 2020-04-08 VITALS — BP 132/81 | HR 92 | Temp 98.3°F | Resp 17 | Ht 60.25 in | Wt 169.0 lb

## 2020-04-08 DIAGNOSIS — Z131 Encounter for screening for diabetes mellitus: Secondary | ICD-10-CM | POA: Diagnosis not present

## 2020-04-08 DIAGNOSIS — E785 Hyperlipidemia, unspecified: Secondary | ICD-10-CM | POA: Diagnosis not present

## 2020-04-08 DIAGNOSIS — K219 Gastro-esophageal reflux disease without esophagitis: Secondary | ICD-10-CM

## 2020-04-08 DIAGNOSIS — Z0000 Encounter for general adult medical examination without abnormal findings: Secondary | ICD-10-CM | POA: Diagnosis not present

## 2020-04-08 DIAGNOSIS — Z800 Family history of malignant neoplasm of digestive organs: Secondary | ICD-10-CM | POA: Diagnosis not present

## 2020-04-08 DIAGNOSIS — Z130 Encounter for screening for diseases of the blood and blood-forming organs and certain disorders involving the immune mechanism: Secondary | ICD-10-CM

## 2020-04-08 LAB — LIPID PANEL
Cholesterol: 188 mg/dL (ref 0–200)
HDL: 37.1 mg/dL — ABNORMAL LOW (ref >39.00)
Total CHOL/HDL Ratio: 5
Triglycerides: 401 mg/dL — ABNORMAL HIGH (ref 0.0–149.0)

## 2020-04-08 LAB — CBC
HCT: 41.6 % (ref 36.0–46.0)
Hemoglobin: 14.1 g/dL (ref 12.0–15.0)
MCHC: 33.9 g/dL (ref 30.0–36.0)
MCV: 88.5 fl (ref 78.0–100.0)
Platelets: 164 10*3/uL (ref 150.0–400.0)
RBC: 4.7 Mil/uL (ref 3.87–5.11)
RDW: 13.4 % (ref 11.5–15.5)
WBC: 7.4 10*3/uL (ref 4.0–10.5)

## 2020-04-08 LAB — COMPREHENSIVE METABOLIC PANEL
ALT: 28 U/L (ref 0–35)
AST: 18 U/L (ref 0–37)
Albumin: 4.1 g/dL (ref 3.5–5.2)
Alkaline Phosphatase: 48 U/L (ref 39–117)
BUN: 16 mg/dL (ref 6–23)
CO2: 27 mEq/L (ref 19–32)
Calcium: 9.4 mg/dL (ref 8.4–10.5)
Chloride: 105 mEq/L (ref 96–112)
Creatinine, Ser: 0.84 mg/dL (ref 0.40–1.20)
GFR: 66.64 mL/min (ref >60.00)
Glucose, Bld: 177 mg/dL — ABNORMAL HIGH (ref 70–99)
Potassium: 4.4 mEq/L (ref 3.5–5.1)
Sodium: 139 mEq/L (ref 135–145)
Total Bilirubin: 0.3 mg/dL (ref 0.2–1.2)
Total Protein: 6.2 g/dL (ref 6.0–8.3)

## 2020-04-08 LAB — AMYLASE: Amylase: 43 U/L (ref 27–131)

## 2020-04-08 LAB — HEMOGLOBIN A1C: Hgb A1c MFr Bld: 6.5 % (ref 4.6–6.5)

## 2020-04-08 LAB — LDL CHOLESTEROL, DIRECT: Direct LDL: 98 mg/dL

## 2020-04-24 DIAGNOSIS — M25561 Pain in right knee: Secondary | ICD-10-CM | POA: Diagnosis not present

## 2020-04-24 DIAGNOSIS — M94261 Chondromalacia, right knee: Secondary | ICD-10-CM | POA: Diagnosis not present

## 2020-04-24 DIAGNOSIS — S83241D Other tear of medial meniscus, current injury, right knee, subsequent encounter: Secondary | ICD-10-CM | POA: Diagnosis not present

## 2020-05-03 ENCOUNTER — Encounter: Payer: Self-pay | Admitting: Family Medicine

## 2020-05-08 NOTE — Progress Notes (Addendum)
[place] at [facility] [place], [place], [place] [address] [phone]  Date:  05/09/2020   Name:  Natalie Fowler   DOB:  [birth year]   MRN:  [id number]  PCP:  Darreld Mclean, MD    Chief Complaint: Cough (follow up, tried med for acid reflux but no improvment, slight productive)   History of Present Illness:  Natalie Fowler is a 74 y.o. very pleasant female patient who presents with the following:  Visit today for concern of cough Patient with history of osteopenia, vitamin D deficiency Last seen by myself 1 month ago to establish care  Here today as a new patient to establish care Her GYN is Dr. Dellis Filbert-  Her mammo is UTD She has a urologist as well- Dr. Delfino Lovett Puschinsky, bladder and kidney cyst GI is [facility]- they do her scope every 3 years due to family history of colon cancer (dad) Her ortho is [facility]- they are looking at her knee issue right now-they are considering a partial knee replacement  COVID-19 series is complete [facility] She did get her pneumonia vaccines per Sadie Haber previously  Her previous PCP was Dr. 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 Low back pain 11/19/2016  . History of vitamin D deficiency 09/30/2016  . H/O colonoscopy with polypectomy 09/30/2015  . Vaginal atrophy 09/25/2014  . Vertigo   . Osteopenia   . Acid reflux     Past Medical History:  Diagnosis Date  . Acid reflux   . Brenner tumor    benign  . Cancer ([place])   . Osteopenia   . Ovarian cyst, bilateral   . Vertigo   . Vitamin D deficiency     Past Surgical History:  Procedure Laterality Date  . CHOLECYSTECTOMY    . CYST EXCISION     CHEST   . OOPHORECTOMY  10/2006   DIAG LAP WITH BSO  . PELVIC LAPAROSCOPY  10/2006   DIAG LAP WITH BSO  . ROTATOR CUFF REPAIR      Social History   Tobacco Use  . Smoking status: Never Smoker  . Smokeless tobacco: Never Used  Vaping Use  . Vaping Use: Never used  Substance Use Topics  . Alcohol use: No    Alcohol/week: 0.0 standard drinks  . Drug use: Not on file    Family History  Problem Relation Age of Onset  . Diabetes Mother   . Hypertension Mother   . Cancer Father  COLON CA  . Osteoporosis Sister   . Cancer Sister 68       PANCREATIC  . Cancer Maternal Aunt        OVARIAN CANCER  . Heart disease Maternal Aunt   . Hypertension Daughter   . Cancer Paternal Uncle        Colon cancer  . Breast cancer Neg Hx     Allergies  Allergen Reactions  . Vioxx [Rofecoxib] Rash    Medication list has been reviewed and updated.  Current Outpatient Medications on File Prior to Visit  Medication Sig Dispense Refill  . cholecalciferol (VITAMIN D) 1000 units tablet Take 2,000 Units by mouth daily.     . Multiple Vitamin (MULTIVITAMIN) tablet Take 1 tablet by mouth daily.    . raloxifene (EVISTA) 60 MG tablet Take 1 tablet (60 mg total) by mouth daily. 90 tablet 4   No current facility-administered medications on file prior to visit.    Review of Systems:  As per HPI- otherwise negative.   Physical Examination: Vitals:   05/09/20 1141  BP: 128/82  Pulse: 90  Resp: 17   Temp: 98.3 F (36.8 C)  SpO2: 98%   Vitals:   05/09/20 1141  Weight: 167 lb (75.8 kg)  Height: 5' (1.524 m)   Body mass index is 32.61 kg/m. Ideal Body Weight: Weight in (lb) to have BMI = 25: 127.7  GEN: no acute distress.  Overweight, looks well HEENT: Atraumatic, Normocephalic.   Bilateral TM wnl, oropharynx normal.  PEERL,EOMI.   Ears and Nose: No external deformity. CV: RRR, No M/G/R. No JVD. No thrill. No extra heart sounds. PULM: CTA B, no wheezes, crackles, rhonchi. No retractions. No resp. distress. No accessory muscle use. ABD: S, NT, ND. No rebound. No HSM. EXTR: No c/c/e PSYCH: Normally interactive. Conversant.    Assessment and Plan: Cough - Plan: DG Chest 2 View, predniSONE (DELTASONE) 20 MG tablet, albuterol (VENTOLIN HFA) 108 (90 Base) MCG/ACT inhaler  Dyslipidemia - Plan: lovastatin (MEVACOR) 20 MG tablet   Patient here today for concern of cough for about 1 month.  She otherwise feels well, she has had her COVID-19 series.  We tried treating her for GERD but this was not that helpful.  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  Today we plan to do a chest film, I will treat her with prednisone for 6 days for likely bronchial inflammation and irritation.  Also prescribed an albuterol inhaler for her to use as needed.    She would like to try a statin, request "the lowest dose possible" as she tends to be sensitive to medications  This visit occurred during the SARS-CoV-2 public health emergency.  Safety protocols were in place, including screening questions prior to the visit, additional usage of staff PPE, and extensive cleaning of exam room while observing appropriate contact time as indicated for disinfecting solutions.    Signed Lamar Blinks, MD  Received her chest film as follows, question of possible infiltrate.  Call patient and will prescribe antibiotic-called both numbers and did not get  an answer.  Will send MyChart message and prescription  Called patient again at 8:10 PM to make sure she received my message.  No answer.  Will send 1 more MyChart message  DG Chest 2 View  Result Date: 05/09/2020 CLINICAL DATA:  Cough. EXAM: CHEST - 2 VIEW COMPARISON:  CT 10/10/2009. FINDINGS: Mediastinum hilar structures normal. Low  lung volumes. Mild right base infiltrate cannot be excluded. No pleural effusion pneumothorax. Heart size normal. No acute bony abnormality. IMPRESSION: Low lung volumes. Mild right base atelectasis. Mild infiltrate cannot be excluded. Electronically Signed   By: Marcello Moores  Register   On: 05/09/2020 12:34

## 2020-05-09 ENCOUNTER — Other Ambulatory Visit: Payer: Self-pay

## 2020-05-09 ENCOUNTER — Encounter: Payer: Self-pay | Admitting: Family Medicine

## 2020-05-09 ENCOUNTER — Ambulatory Visit (INDEPENDENT_AMBULATORY_CARE_PROVIDER_SITE_OTHER): Payer: PPO | Admitting: Family Medicine

## 2020-05-09 ENCOUNTER — Ambulatory Visit (HOSPITAL_BASED_OUTPATIENT_CLINIC_OR_DEPARTMENT_OTHER)
Admission: RE | Admit: 2020-05-09 | Discharge: 2020-05-09 | Disposition: A | Discharge status: Home / Self Care | Payer: PPO | Source: Ambulatory Visit | Attending: Family Medicine | Admitting: Family Medicine

## 2020-05-09 VITALS — BP 128/82 | HR 90 | Temp 98.3°F | Resp 17 | Ht 60.0 in | Wt 167.0 lb

## 2020-05-09 DIAGNOSIS — E785 Hyperlipidemia, unspecified: Secondary | ICD-10-CM | POA: Diagnosis not present

## 2020-05-09 DIAGNOSIS — J9811 Atelectasis: Secondary | ICD-10-CM | POA: Diagnosis not present

## 2020-05-09 DIAGNOSIS — R059 Cough, unspecified: Secondary | ICD-10-CM

## 2020-05-09 DIAGNOSIS — R05 Cough: Secondary | ICD-10-CM | POA: Insufficient documentation

## 2020-05-09 MED ORDER — LOVASTATIN 20 MG PO TABS: 20.0000 mg | ORAL_TABLET | Freq: Every day | ORAL | 3 refills | Status: DC

## 2020-05-09 MED ORDER — PREDNISONE 20 MG PO TABS: ORAL_TABLET | ORAL | 0 refills | Status: DC

## 2020-05-09 MED ORDER — DOXYCYCLINE HYCLATE 100 MG PO CAPS: 100.0000 mg | ORAL_CAPSULE | Freq: Two times a day (BID) | ORAL | 0 refills | Status: DC

## 2020-05-09 MED ORDER — ALBUTEROL SULFATE HFA 108 (90 BASE) MCG/ACT IN AERS: 2.0000 | INHALATION_SPRAY | Freq: Four times a day (QID) | RESPIRATORY_TRACT | 1 refills | Status: DC | PRN

## 2020-05-09 NOTE — Patient Instructions (Signed)
It was good to see you again today, best of luck with your knee surgery  Please go to ground floor imaging department have an x-ray made of your chest  Assuming this looks okay, we will plan to treat you for bronchial irritation with prednisone for 6 days.  You can also use the albuterol inhaler as needed for cough  Please let me know if this is not helping or if you are getting worse

## 2020-05-27 NOTE — Progress Notes (Signed)
[place] at [facility] [address], [place], [place] [address] [phone]  Date:  05/30/2020   Name:  Natalie Fowler   DOB:  Mar 24, 1946   MRN:  [id number]  PCP:  Darreld Mclean, MD    Chief Complaint: Follow-up (pneumonia follow up)   History of Present Illness:  Natalie Fowler is a 74 y.o. very pleasant female patient who presents with the following:  Here today for follow-up visit History of osteopenia, vitamin D deficiency, acid reflux, family history of colon cancer  Last seen by myself in mid June with concern of cough-chest film at that time showed possible mild infiltrate, treated with doxycycline She finished up her doxy, but has continued to cough some.  She plans to have a knee replacement, but this was canceled/ delayed due to her recent pneumonia. She otherwise feels fine Cough is dry Never a smoker, does not drink much alcohol  We also started her on a low-dose of lovastatin due to dyslipidemia; patient noted difficulty tolerating medications in the past  Colon cancer screening- she does q 3 years for polyps, she sees Dr Shana Chute.  She notes due this fall/ winter.  I will request these records  Mammo up-to-date Covid complete Shingrix complete Labs completed in May Patient Active Problem List   Diagnosis Date Noted  . Low back pain 11/19/2016  . History of vitamin D deficiency 09/30/2016  . H/O colonoscopy with polypectomy 09/30/2015  . Vaginal atrophy 09/25/2014  . Vertigo   . Osteopenia   . Acid reflux     Past Medical History:  Diagnosis Date  . Acid reflux   . Brenner tumor    benign  . Cancer ([place])   . Osteopenia   . Ovarian cyst, bilateral   . Vertigo   . Vitamin D deficiency     Past Surgical History:  Procedure Laterality Date  . CHOLECYSTECTOMY    . CYST EXCISION     CHEST   . OOPHORECTOMY  10/2006   DIAG LAP WITH BSO  . PELVIC LAPAROSCOPY  10/2006   DIAG LAP WITH BSO  .  ROTATOR CUFF REPAIR      Social History   Tobacco Use  . Smoking status: Never Smoker  . Smokeless tobacco: Never Used  Vaping Use  . Vaping Use: Never used  Substance Use Topics  . Alcohol use: No    Alcohol/week: 0.0 standard drinks  . Drug use: Not on file    Family History  Problem Relation Age of Onset  . Diabetes Mother   . Hypertension Mother   . Cancer Father        COLON CA  . Osteoporosis Sister   . Cancer Sister 54       PANCREATIC  . Cancer Maternal Aunt        OVARIAN CANCER  . Heart disease Maternal Aunt   . Hypertension Daughter   . Cancer Paternal Uncle        Colon cancer  . Breast cancer Neg Hx     Allergies  Allergen Reactions  . Vioxx [Rofecoxib] Rash    Medication list has been reviewed and updated.  Current Outpatient Medications on File Prior to Visit  Medication Sig Dispense Refill  . albuterol (VENTOLIN HFA) 108 (90 Base) MCG/ACT inhaler Inhale 2 puffs into the lungs every 6 (six) hours as needed for wheezing or shortness of breath. 18 g 1  . cholecalciferol (VITAMIN D)  1000 units tablet Take 2,000 Units by mouth daily.     Marland Kitchen lovastatin (MEVACOR) 20 MG tablet Take 1 tablet (20 mg total) by mouth at bedtime. 90 tablet 3  . Multiple Vitamin (MULTIVITAMIN) tablet Take 1 tablet by mouth daily.    . raloxifene (EVISTA) 60 MG tablet Take 1 tablet (60 mg total) by mouth daily. 90 tablet 4   No current facility-administered medications on file prior to visit.    Review of Systems:  As per HPI- otherwise negative.   Physical Examination: Vitals:   05/30/20 0827  BP: 118/74  Pulse: 92  Resp: 17  Temp: 97.8 F (36.6 C)  SpO2: 98%   Vitals:   05/30/20 0827  Weight: 167 lb (75.8 kg)  Height: 5' (1.524 m)   Body mass index is 32.61 kg/m. Ideal Body Weight: Weight in (lb) to have BMI = 25: 127.7  GEN: no acute distress.  Overweight, looks well  HEENT: Atraumatic, Normocephalic.   Bilateral TM wnl, oropharynx normal.  PEERL,EOMI.    Ears and Nose: No external deformity. CV: RRR, No M/G/R. No JVD. No thrill. No extra heart sounds. PULM: CTA B, no wheezes, crackles, rhonchi. No retractions. No resp. distress. No accessory muscle use.  Lungs sound quite clear today  ABD: S, NT, ND EXTR: No c/c/e PSYCH: Normally interactive. Conversant.    Assessment and Plan: Community acquired pneumonia of right lower lobe of lung - Plan: DG Chest 2 View  Cough - Plan: benzonatate (TESSALON PERLES) 100 MG capsule  Following up recent dx of CAP today- needs a repeat CXR so that she can undergo total joint next week Clinically she is well, occasional dry cough only rx for tessalon and await her CXR  This visit occurred during the SARS-CoV-2 public health emergency.  Safety protocols were in place, including screening questions prior to the visit, additional usage of staff PPE, and extensive cleaning of exam room while observing appropriate contact time as indicated for disinfecting solutions.    Signed Lamar Blinks, MD   DG Chest 2 View  Result Date: 05/30/2020 CLINICAL DATA:  Recent pneumonia EXAM: CHEST - 2 VIEW COMPARISON:  May 09, 2020 FINDINGS: Lungs are clear. Heart size and pulmonary vascularity are normal. No adenopathy. No bone lesions. IMPRESSION: Lungs now clear.  Cardiac silhouette normal.  No adenopathy. Electronically Signed   By: Lowella Grip III M.D.   On: 05/30/2020 09:15   Message to pt- CXR now clear, should be all set for her operation.  Per pt the pre-op nurse does not need anything further from me

## 2020-05-30 ENCOUNTER — Ambulatory Visit (HOSPITAL_BASED_OUTPATIENT_CLINIC_OR_DEPARTMENT_OTHER)
Admission: RE | Admit: 2020-05-30 | Discharge: 2020-05-30 | Disposition: A | Discharge status: Home / Self Care | Payer: PPO | Source: Ambulatory Visit | Attending: Family Medicine | Admitting: Family Medicine

## 2020-05-30 ENCOUNTER — Encounter: Payer: Self-pay | Admitting: Family Medicine

## 2020-05-30 ENCOUNTER — Ambulatory Visit (INDEPENDENT_AMBULATORY_CARE_PROVIDER_SITE_OTHER): Payer: PPO | Admitting: Family Medicine

## 2020-05-30 ENCOUNTER — Other Ambulatory Visit: Payer: Self-pay

## 2020-05-30 VITALS — BP 118/74 | HR 92 | Temp 97.8°F | Resp 17 | Ht 60.0 in | Wt 167.0 lb

## 2020-05-30 DIAGNOSIS — J189 Pneumonia, unspecified organism: Secondary | ICD-10-CM | POA: Insufficient documentation

## 2020-05-30 DIAGNOSIS — R059 Cough, unspecified: Secondary | ICD-10-CM

## 2020-05-30 DIAGNOSIS — R05 Cough: Secondary | ICD-10-CM | POA: Diagnosis not present

## 2020-05-30 MED ORDER — BENZONATATE 100 MG PO CAPS: 100.0000 mg | ORAL_CAPSULE | Freq: Three times a day (TID) | ORAL | 1 refills | Status: DC | PRN

## 2020-05-30 NOTE — Patient Instructions (Addendum)
I will be in touch with your chest x-ray later this morning; assuming clear you should be all set for your operation.   Use tessalon perles as needed for cough

## 2020-06-03 DIAGNOSIS — S83271A Complex tear of lateral meniscus, current injury, right knee, initial encounter: Secondary | ICD-10-CM | POA: Diagnosis not present

## 2020-06-03 DIAGNOSIS — Y999 Unspecified external cause status: Secondary | ICD-10-CM | POA: Diagnosis not present

## 2020-06-03 DIAGNOSIS — M94261 Chondromalacia, right knee: Secondary | ICD-10-CM | POA: Diagnosis not present

## 2020-06-03 DIAGNOSIS — G8918 Other acute postprocedural pain: Secondary | ICD-10-CM | POA: Diagnosis not present

## 2020-06-03 DIAGNOSIS — X58XXXA Exposure to other specified factors, initial encounter: Secondary | ICD-10-CM | POA: Diagnosis not present

## 2020-06-03 DIAGNOSIS — S83231A Complex tear of medial meniscus, current injury, right knee, initial encounter: Secondary | ICD-10-CM | POA: Diagnosis not present

## 2020-06-17 DIAGNOSIS — R262 Difficulty in walking, not elsewhere classified: Secondary | ICD-10-CM | POA: Diagnosis not present

## 2020-06-17 DIAGNOSIS — M25561 Pain in right knee: Secondary | ICD-10-CM | POA: Diagnosis not present

## 2020-06-17 DIAGNOSIS — Z4789 Encounter for other orthopedic aftercare: Secondary | ICD-10-CM | POA: Diagnosis not present

## 2020-06-19 DIAGNOSIS — Z4789 Encounter for other orthopedic aftercare: Secondary | ICD-10-CM | POA: Diagnosis not present

## 2020-06-19 DIAGNOSIS — M25561 Pain in right knee: Secondary | ICD-10-CM | POA: Diagnosis not present

## 2020-06-19 DIAGNOSIS — R262 Difficulty in walking, not elsewhere classified: Secondary | ICD-10-CM | POA: Diagnosis not present

## 2020-06-24 DIAGNOSIS — Z4789 Encounter for other orthopedic aftercare: Secondary | ICD-10-CM | POA: Diagnosis not present

## 2020-06-24 DIAGNOSIS — M25561 Pain in right knee: Secondary | ICD-10-CM | POA: Diagnosis not present

## 2020-06-24 DIAGNOSIS — R262 Difficulty in walking, not elsewhere classified: Secondary | ICD-10-CM | POA: Diagnosis not present

## 2020-06-27 DIAGNOSIS — R262 Difficulty in walking, not elsewhere classified: Secondary | ICD-10-CM | POA: Diagnosis not present

## 2020-06-27 DIAGNOSIS — M25561 Pain in right knee: Secondary | ICD-10-CM | POA: Diagnosis not present

## 2020-06-27 DIAGNOSIS — Z4789 Encounter for other orthopedic aftercare: Secondary | ICD-10-CM | POA: Diagnosis not present

## 2020-07-01 DIAGNOSIS — M25561 Pain in right knee: Secondary | ICD-10-CM | POA: Diagnosis not present

## 2020-07-01 DIAGNOSIS — R262 Difficulty in walking, not elsewhere classified: Secondary | ICD-10-CM | POA: Diagnosis not present

## 2020-07-01 DIAGNOSIS — Z4789 Encounter for other orthopedic aftercare: Secondary | ICD-10-CM | POA: Diagnosis not present

## 2020-07-04 DIAGNOSIS — M25561 Pain in right knee: Secondary | ICD-10-CM | POA: Diagnosis not present

## 2020-07-04 DIAGNOSIS — Z4789 Encounter for other orthopedic aftercare: Secondary | ICD-10-CM | POA: Diagnosis not present

## 2020-07-04 DIAGNOSIS — R262 Difficulty in walking, not elsewhere classified: Secondary | ICD-10-CM | POA: Diagnosis not present

## 2020-08-01 ENCOUNTER — Encounter: Payer: Self-pay | Admitting: Family Medicine

## 2020-08-02 NOTE — Telephone Encounter (Signed)
New Patient scheduled to see [place].

## 2020-08-05 ENCOUNTER — Other Ambulatory Visit: Payer: Self-pay | Admitting: Family Medicine

## 2020-08-05 DIAGNOSIS — Z1231 Encounter for screening mammogram for malignant neoplasm of breast: Secondary | ICD-10-CM

## 2020-08-19 DIAGNOSIS — Z4789 Encounter for other orthopedic aftercare: Secondary | ICD-10-CM | POA: Diagnosis not present

## 2020-09-24 NOTE — Progress Notes (Addendum)
[facility] [place], [place], [place] [address] [phone]  Date:  09/26/2020   Name:  Natalie Fowler   DOB:  [birth year]   MRN:  [id number]  PCP:  Darreld Mclean, MD    Chief Complaint: Chest Pain (chest pain, tingling in left arm)   History of Present Illness:  Natalie Fowler is a 74 y.o. very pleasant female patient who presents with the following:  Patient here today for follow-up and to discuss a skin concern History of vaginal atrophy, osteopenia, vitamin D deficiency, family history of colon cancer Last seen by myself in July to follow-up community-acquired pneumonia However, today pt has complaint of chest pain in the left side She also noted some tingling of her left hand which occurred a few days ago- now resolved The tingling tends to be in the thumb, index and long finger-as possible carpal tunnel syndrome  The CP occurred about 2 weeks ago, and once prior in September The pain lasted a couple of minutes.  Seemed to be worse with deep breathing.  Pain is non exertional No SOB Pt states no history of of CAD.  She did do a stress test many years ago and it was normal -no cardiac evaluation since that time  She is married, 2 children and 5 grandchildren.  Lives in [place].  Retired from her work as a Veterinary surgeon  Flu vaccine- give today Mammogram is scheduled Covid booster is done!  Most recent lab work in [place] present.  She is currently taking lovastatin, can recheck lipids today if she likes.  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   Cancer ([place])    Osteopenia    Ovarian cyst, bilateral    Vertigo    Vitamin D deficiency     Past Surgical History:  Procedure Laterality Date   CHOLECYSTECTOMY     CYST EXCISION     CHEST    OOPHORECTOMY  10/2006   DIAG LAP WITH BSO   PELVIC LAPAROSCOPY  10/2006   DIAG LAP WITH BSO   ROTATOR CUFF REPAIR      Social History   Tobacco Use   Smoking status: Never Smoker   Smokeless tobacco: Never Used  Vaping Use   Vaping Use: Never used  Substance Use Topics   Alcohol use: No    Alcohol/week: 0.0 standard drinks   Drug use: Not on file    Family History  Problem Relation Age of Onset   Diabetes Mother    Hypertension Mother    Cancer Father        COLON CA   Osteoporosis Sister    Cancer Sister 88       PANCREATIC   Cancer Maternal Aunt        OVARIAN CANCER   Heart disease Maternal Aunt    Hypertension Daughter    Cancer Paternal Uncle        Colon cancer   Breast cancer Neg Hx     Allergies  Allergen Reactions   Vioxx [Rofecoxib] Rash    Medication list has been reviewed and updated.  Current Outpatient Medications on File Prior to Visit  Medication Sig Dispense Refill   albuterol (VENTOLIN HFA) 108 (90 Base) MCG/ACT inhaler Inhale 2 puffs into the lungs every 6 (six) hours as needed for wheezing or shortness of breath. 18 g 1   cholecalciferol (VITAMIN D) 1000 units tablet Take 2,000 Units by mouth daily.      lovastatin (MEVACOR) 20 MG tablet Take 1 tablet (20 mg total) by mouth at bedtime. 90 tablet 3   Multiple Vitamin (MULTIVITAMIN) tablet Take 1 tablet by mouth daily.     raloxifene (EVISTA) 60 MG tablet Take 1 tablet (60 mg total) by mouth daily. 90 tablet 4   No current facility-administered medications on file prior to visit.    Review of Systems:  As per HPI- otherwise negative.   Physical Examination: Vitals:   09/26/20 0836  BP: 136/86  Pulse: 84   Resp: 18  SpO2: 97%   Vitals:   09/26/20 0836  Weight: 170 lb (77.1 kg)  Height: 5' (1.524 m)   Body mass index is 33.2 kg/m. Ideal Body Weight: Weight in (lb) to have BMI = 25: 127.7  GEN: no acute distress.  Obese, looks well  HEENT: Atraumatic, Normocephalic.  Ears and Nose: No external deformity. CV: RRR, No M/G/R. No JVD. No thrill. No extra heart sounds. PULM: CTA B, no wheezes, crackles, rhonchi. No retractions. No resp. distress. No accessory muscle use. ABD: S, NT, ND, +BS. No rebound. No HSM. EXTR: No c/c/e PSYCH: Normally interactive. Conversant.  Cervical spine ROM is reduced laterally. No pain  EKG: SR with mild short PR No old EKG on chart for comparison No ST elevation or depression noted   Assessment and Plan: Chest pain, unspecified type - Plan: CBC, EKG 63-OVFI, Basic metabolic panel, Hemoglobin A1c, DG Chest 2 View, Troponin I, CANCELED: Troponin I  Numbness and tingling in left hand - Plan: DG Cervical Spine Complete  Elevated glucose - Plan: Hemoglobin A1c  Needs flu shot - Plan: Flu Vaccine QUAD High Dose(Fluad)  Patient seen today with concern of atypical chest pain EKG is reassuring Plan for troponin, chest film Assuming both negative, will schedule her for a cardiac stress test.  Will need a nuclear scan as she is recently recovered from knee surgery  Flu vaccine today Suspect intermittent numbness of her left hand is due to either carpal tunnel syndrome or known cervical spine disease.  Will obtain films of her neck as well  This visit occurred during the SARS-CoV-2 public health emergency.  Safety protocols were in place, including screening questions prior to the visit, additional usage of staff PPE, and extensive cleaning of exam room while observing appropriate contact time as indicated for disinfecting solutions.    Signed Lamar Blinks, MD  Received her films- message to pt  DG Chest 2 View  Result Date: 09/26/2020 CLINICAL  DATA:  Chest pain, unspecified type. Pain in chest, intermittent. Additional history provided: Patient reports off and on chest pain for 2 months, left hand tingling for 4 days, no known injury. EXAM: CHEST - 2 VIEW COMPARISON:  Prior chest radiographs 05/30/2020. FINDINGS: Heart size within normal limits. Aortic atherosclerosis. No appreciable airspace consolidation or pulmonary edema. No evidence of pleural effusion or pneumothorax. No acute bony abnormality is identified. Surgical clips within the right upper quadrant of the abdomen. IMPRESSION: No evidence of acute cardiopulmonary abnormality. Aortic  Atherosclerosis (ICD10-I70.0). Electronically Signed   By: Kellie Simmering DO   On: 09/26/2020 09:40   DG Cervical Spine Complete  Result Date: 09/26/2020 CLINICAL DATA:  Numbness and tingling in left hand. Tingling in left hand. EXAM: CERVICAL SPINE - COMPLETE 4+ VIEW COMPARISON:  Report from radiographs of the cervical spine 04/26/2009 (images unavailable). FINDINGS: Trace C6-C7 grade 1 anterolisthesis. Vertebral body height is maintained. No radiographic evidence of fracture to the cervical spine. And cervix spondylosis. This includes advanced disc space narrowing at C2-C3, C3-C4, C4-C5 and C5-C6. Multilevel uncovertebral hypertrophy and facet arthrosis. Multilevel bony neural foraminal narrowing greatest on the right at C3-C4 (moderate) and on the right at C5-C6 (moderate). Unremarkable appearance of the C1-C2 articulation on the dedicated odontoid view. IMPRESSION: Advanced cervical spondylosis as described. Mild C6-C7 grade 1 anterolisthesis. Electronically Signed   By: Kellie Simmering DO   On: 09/26/2020 09:44   Received troponin, also negative.  Message to patient, order stress test Results for orders placed or performed in visit on 09/26/20  Troponin I  Result Value Ref Range   Troponin I <3 < OR = 47 ng/L   Addendum 11/5, received her labs as below.  Letter to patient  Results for orders placed or  performed in visit on 09/26/20  CBC  Result Value Ref Range   WBC 5.6 3.8 - 10.8 Thousand/uL   RBC 4.80 3.80 - 5.10 Million/uL   Hemoglobin 14.8 11.7 - 15.5 g/dL   HCT 42.8 35 - 45 %   MCV 89.2 80.0 - 100.0 fL   MCH 30.8 27.0 - 33.0 pg   MCHC 34.6 32.0 - 36.0 g/dL   RDW 12.6 11.0 - 15.0 %   Platelets 162 140 - 400 Thousand/uL   MPV 13.4 (H) 7.5 - 12.5 fL  Basic metabolic panel  Result Value Ref Range   Glucose, Bld 134 (H) 65 - 99 mg/dL   BUN 14 7 - 25 mg/dL   Creat 0.84 0.60 - 0.93 mg/dL   BUN/Creatinine Ratio NOT APPLICABLE 6 - 22 (calc)   Sodium 142 135 - 146 mmol/L   Potassium 4.5 3.5 - 5.3 mmol/L   Chloride 104 98 - 110 mmol/L   CO2 27 20 - 32 mmol/L   Calcium 9.7 8.6 - 10.4 mg/dL  Hemoglobin A1c  Result Value Ref Range   Hgb A1c MFr Bld 7.0 (H) <5.7 % of total Hgb   Mean Plasma Glucose 154 (calc)   eAG (mmol/L) 8.5 (calc)  Troponin I  Result Value Ref Range   Troponin I <3 < OR = 47 ng/L

## 2020-09-26 ENCOUNTER — Ambulatory Visit (HOSPITAL_BASED_OUTPATIENT_CLINIC_OR_DEPARTMENT_OTHER)
Admission: RE | Admit: 2020-09-26 | Discharge: 2020-09-26 | Disposition: A | Discharge status: Home / Self Care | Payer: PPO | Source: Ambulatory Visit | Attending: Family Medicine | Admitting: Family Medicine

## 2020-09-26 ENCOUNTER — Encounter: Payer: Self-pay | Admitting: Family Medicine

## 2020-09-26 ENCOUNTER — Other Ambulatory Visit: Payer: Self-pay

## 2020-09-26 ENCOUNTER — Ambulatory Visit (INDEPENDENT_AMBULATORY_CARE_PROVIDER_SITE_OTHER): Payer: PPO | Admitting: Family Medicine

## 2020-09-26 VITALS — BP 136/86 | HR 84 | Resp 18 | Ht 60.0 in | Wt 170.0 lb

## 2020-09-26 DIAGNOSIS — Z23 Encounter for immunization: Secondary | ICD-10-CM

## 2020-09-26 DIAGNOSIS — R200 Anesthesia of skin: Secondary | ICD-10-CM

## 2020-09-26 DIAGNOSIS — R079 Chest pain, unspecified: Secondary | ICD-10-CM

## 2020-09-26 DIAGNOSIS — R7309 Other abnormal glucose: Secondary | ICD-10-CM

## 2020-09-26 DIAGNOSIS — R202 Paresthesia of skin: Secondary | ICD-10-CM | POA: Diagnosis not present

## 2020-09-26 DIAGNOSIS — M47812 Spondylosis without myelopathy or radiculopathy, cervical region: Secondary | ICD-10-CM | POA: Diagnosis not present

## 2020-09-26 DIAGNOSIS — M4312 Spondylolisthesis, cervical region: Secondary | ICD-10-CM | POA: Diagnosis not present

## 2020-09-26 NOTE — Patient Instructions (Addendum)
Please go to lab and then to the ground floor for a chest and neck x-ray I will be in touch with your labs asap!  I suspect that your chest pain is not your heart, but would recommend that we get you a stress test to make sure all is well.  I will set this up for you  Please seek care if your chest pain returns or gets worse in the meantime  Please start on your cholesterol medication and also take a baby aspirin daily until we get your stress test back

## 2020-09-27 LAB — CBC
HCT: 42.8 % (ref 35.0–45.0)
Hemoglobin: 14.8 g/dL (ref 11.7–15.5)
MCH: 30.8 pg (ref 27.0–33.0)
MCHC: 34.6 g/dL (ref 32.0–36.0)
MCV: 89.2 fL (ref 80.0–100.0)
MPV: 13.4 fL — ABNORMAL HIGH (ref 7.5–12.5)
Platelets: 162 10*3/uL (ref 140–400)
RBC: 4.8 10*6/uL (ref 3.80–5.10)
RDW: 12.6 % (ref 11.0–15.0)
WBC: 5.6 10*3/uL (ref 3.8–10.8)

## 2020-09-27 LAB — BASIC METABOLIC PANEL
BUN: 14 mg/dL (ref 7–25)
CO2: 27 mmol/L (ref 20–32)
Calcium: 9.7 mg/dL (ref 8.6–10.4)
Chloride: 104 mmol/L (ref 98–110)
Creat: 0.84 mg/dL (ref 0.60–0.93)
Glucose, Bld: 134 mg/dL — ABNORMAL HIGH (ref 65–99)
Potassium: 4.5 mmol/L (ref 3.5–5.3)
Sodium: 142 mmol/L (ref 135–146)

## 2020-09-27 LAB — HEMOGLOBIN A1C
Hgb A1c MFr Bld: 7 % of total Hgb — ABNORMAL HIGH (ref <5.7)
Mean Plasma Glucose: 154 (calc)
eAG (mmol/L): 8.5 (calc)

## 2020-09-27 LAB — TROPONIN I: Troponin I: <3 ng/L (ref <=47)

## 2020-09-30 ENCOUNTER — Encounter (HOSPITAL_COMMUNITY): Payer: Self-pay | Admitting: Family Medicine

## 2020-10-03 ENCOUNTER — Ambulatory Visit
Admission: RE | Admit: 2020-10-03 | Discharge: 2020-10-03 | Disposition: A | Discharge status: Home / Self Care | Payer: PPO | Source: Ambulatory Visit | Attending: Family Medicine | Admitting: Family Medicine

## 2020-10-03 ENCOUNTER — Other Ambulatory Visit: Payer: Self-pay

## 2020-10-03 DIAGNOSIS — Z1231 Encounter for screening mammogram for malignant neoplasm of breast: Secondary | ICD-10-CM | POA: Diagnosis not present

## 2020-10-07 ENCOUNTER — Telehealth (HOSPITAL_COMMUNITY): Payer: Self-pay | Admitting: Radiology

## 2020-10-07 NOTE — Telephone Encounter (Signed)
Patient given detailed instructions per Myocardial Perfusion Study Information Sheet for the test on 10/14/2020 at 7:30. Patient notified to arrive 15 minutes early and that it is imperative to arrive on time for appointment to keep from having the test rescheduled.  If you need to cancel or reschedule your appointment, please call the office within 24 hours of your appointment. . Patient verbalized understanding.EHK

## 2020-10-08 ENCOUNTER — Encounter: Payer: Self-pay | Admitting: Family Medicine

## 2020-10-08 DIAGNOSIS — N3010 Interstitial cystitis (chronic) without hematuria: Secondary | ICD-10-CM | POA: Diagnosis not present

## 2020-10-14 ENCOUNTER — Other Ambulatory Visit: Payer: Self-pay

## 2020-10-14 ENCOUNTER — Encounter: Payer: Self-pay | Admitting: Family Medicine

## 2020-10-14 ENCOUNTER — Ambulatory Visit (HOSPITAL_COMMUNITY): Payer: PPO | Attending: Cardiology

## 2020-10-14 DIAGNOSIS — R079 Chest pain, unspecified: Secondary | ICD-10-CM

## 2020-10-14 LAB — MYOCARDIAL PERFUSION IMAGING
LV dias vol: 46 mL (ref 46–106)
LV sys vol: 13 mL
Peak HR: 115 {beats}/min
Rest HR: 90 {beats}/min
SDS: 2
SRS: 0
SSS: 2
TID: 0.89

## 2020-10-14 MED ORDER — TECHNETIUM TC 99M TETROFOSMIN IV KIT
30.7000 | PACK | Freq: Once | INTRAVENOUS | Status: AC | PRN
  Administered 2020-10-14: 30.7 via INTRAVENOUS
  Filled 2020-10-14: qty 31

## 2020-10-14 MED ORDER — REGADENOSON 0.4 MG/5ML IV SOLN
0.4000 mg | Freq: Once | INTRAVENOUS | Status: AC
  Administered 2020-10-14: 0.4 mg via INTRAVENOUS

## 2020-10-14 MED ORDER — TECHNETIUM TC 99M TETROFOSMIN IV KIT
10.1000 | PACK | Freq: Once | INTRAVENOUS | Status: AC | PRN
  Administered 2020-10-14: 10.1 via INTRAVENOUS
  Filled 2020-10-14: qty 11

## 2020-10-15 ENCOUNTER — Encounter (HOSPITAL_COMMUNITY): Payer: PPO

## 2020-10-28 ENCOUNTER — Encounter: Payer: Self-pay | Admitting: Family Medicine

## 2020-10-30 ENCOUNTER — Other Ambulatory Visit: Payer: Self-pay

## 2020-10-30 ENCOUNTER — Encounter: Payer: Self-pay | Admitting: Obstetrics & Gynecology

## 2020-10-30 ENCOUNTER — Ambulatory Visit (INDEPENDENT_AMBULATORY_CARE_PROVIDER_SITE_OTHER): Payer: PPO | Admitting: Obstetrics & Gynecology

## 2020-10-30 VITALS — BP 126/78 | Ht 60.0 in | Wt 173.0 lb

## 2020-10-30 DIAGNOSIS — Z6833 Body mass index (BMI) 33.0-33.9, adult: Secondary | ICD-10-CM

## 2020-10-30 DIAGNOSIS — Z780 Asymptomatic menopausal state: Secondary | ICD-10-CM

## 2020-10-30 DIAGNOSIS — M8589 Other specified disorders of bone density and structure, multiple sites: Secondary | ICD-10-CM

## 2020-10-30 DIAGNOSIS — E6609 Other obesity due to excess calories: Secondary | ICD-10-CM

## 2020-10-30 DIAGNOSIS — Z01419 Encounter for gynecological examination (general) (routine) without abnormal findings: Secondary | ICD-10-CM | POA: Diagnosis not present

## 2020-10-30 NOTE — Addendum Note (Signed)
Addended by: Lorine Bears on: 10/30/2020 09:28 AM   Modules accepted: Orders

## 2020-10-30 NOTE — Progress Notes (Signed)
Natalie Fowler 08/21/1946 [id number]   History:    74 y.o. G2P2L2 Married  [id number] presenting for annual gyn exam   HPI: Postmenopause, well on no hormone replacement therapy. No postmenopausal bleeding. No pelvic pain. Occasionally sexually active with no problem. Urine normal, no stress urinary incontinence. Followed by urologist for history of renal cyst. Bowel movements normal. Breasts normal. Last mammogram November 2021 was negative.Body mass index increased to 33.79. Not physically active on a regular basis. Rt knee torn meniscus repaired. Good nutrition with vegetables and fruits. Health labs with family physician.  [place]ono 2019.  Past medical history,surgical history, family history and social history were all reviewed and documented in the EPIC chart.  Gynecologic History No LMP recorded. Patient is postmenopausal.  Obstetric History OB History  Gravida Para Term Preterm AB Living  3 2     1 2   SAB TAB Ectopic Multiple Live Births  1   0        # Outcome Date GA Lbr Len/2nd Weight Sex Delivery Anes PTL Lv  3 SAB           2 Para           1 Para              ROS: A ROS was performed and pertinent positives and negatives are included in the history.  GENERAL: No fevers or chills. HEENT: No change in vision, no earache, sore throat or sinus congestion. NECK: No pain or stiffness. CARDIOVASCULAR: No chest pain or pressure. No palpitations. PULMONARY: No shortness of breath, cough or wheeze. GASTROINTESTINAL: No abdominal pain, nausea, vomiting or diarrhea, melena or bright red blood per rectum. GENITOURINARY: No urinary frequency, urgency, hesitancy or dysuria. MUSCULOSKELETAL: No joint or muscle pain, no back pain, no recent trauma. DERMATOLOGIC: No rash, no itching, no lesions. ENDOCRINE: No polyuria, polydipsia, no heat or cold intolerance. No recent change in weight. HEMATOLOGICAL: No anemia or easy bruising or bleeding. NEUROLOGIC: No  headache, seizures, numbness, tingling or weakness. PSYCHIATRIC: No depression, no loss of interest in normal activity or change in sleep pattern.     Exam:   BP 126/78 (BP Location: Right Arm, Patient Position: Sitting, Cuff Size: Normal)   Ht 5' (1.524 m)   Wt 173 lb (78.5 kg)   BMI 33.79 kg/m   Body mass index is 33.79 kg/m.  General appearance : Well developed well nourished female. No acute distress HEENT: Eyes: no retinal hemorrhage or exudates,  Neck supple, trachea midline, no carotid bruits, no thyroidmegaly Lungs: Clear to auscultation, no rhonchi or wheezes, or rib retractions  Heart: Regular rate and rhythm, no murmurs or gallops Breast:Examined in sitting and supine position were symmetrical in appearance, no palpable masses or tenderness,  no skin retraction, no nipple inversion, no nipple discharge, no skin discoloration, no axillary or supraclavicular lymphadenopathy Abdomen: no palpable masses or tenderness, no rebound or guarding Extremities: no edema or skin discoloration or tenderness  Pelvic: Vulva: Normal             Vagina: No gross lesions or discharge  Cervix: No gross lesions or discharge.  Pap reflex done.  Uterus  AV, normal size, shape and consistency, non-tender and mobile  Adnexa  Without masses or tenderness  Anus: Normal   Assessment/Plan:  74 y.o. female for annual exam   1. Encounter for routine gynecological examination with Papanicolaou smear of cervix Normal gynecologic exam and postmenopause.  Pap reflex done today.  Breast exam  normal.  Screening mammogram November 2021 was negative.  Colonoscopy in 2019.  Health labs with family physician.  2. Postmenopausal Well on no hormone replacement therapy.  No postmenopausal bleeding.  3. Osteopenia of multiple sites Osteopenia on last bone density November 2019.  We will repeat a bone density here now.  Vitamin D supplements, calcium intake of 1500 mg daily and regular weightbearing physical  activity is recommended. - DG Bone Density; Future  4. Class 1 obesity due to excess calories with serious comorbidity and body mass index (BMI) of 33.0 to 33.9 in adult Lower calorie/carb diet recommended.  Referred to the [place] eating plan.  Recommend aerobic activities 5 times a week and light weightlifting every 2 days.  Other orders - raloxifene (EVISTA) 60 MG tablet; Take 60 mg by mouth daily. Princess Bruins MD, 8:37 AM 10/30/2020

## 2020-10-31 LAB — PAP IG W/ RFLX HPV ASCU

## 2020-11-19 ENCOUNTER — Ambulatory Visit (INDEPENDENT_AMBULATORY_CARE_PROVIDER_SITE_OTHER): Payer: PPO

## 2020-11-19 ENCOUNTER — Other Ambulatory Visit: Payer: Self-pay | Admitting: Obstetrics & Gynecology

## 2020-11-19 ENCOUNTER — Other Ambulatory Visit: Payer: Self-pay

## 2020-11-19 DIAGNOSIS — M8589 Other specified disorders of bone density and structure, multiple sites: Secondary | ICD-10-CM | POA: Diagnosis not present

## 2020-11-19 DIAGNOSIS — Z780 Asymptomatic menopausal state: Secondary | ICD-10-CM | POA: Diagnosis not present

## 2021-01-17 NOTE — Patient Instructions (Addendum)
Good to see you again today!   I will be in touch with your labs You might consider water aerobics for exercise- this can be a great way to get exercise if you have joint issues Plan to update your colonoscopy with Dr Shana Chute.  If you would like a referral to a [facility] doctor just let me know  Assuming all is well please see me in 6 months   Health Maintenance After Age 73 After age 74, you are at a higher risk for certain long-term diseases and infections as well as injuries from falls. Falls are a major cause of broken bones and head injuries in people who are older than age 43. Getting regular preventive care can help to keep you healthy and well. Preventive care includes getting regular testing and making lifestyle changes as recommended by your health care provider. Talk with your health care provider about:  Which screenings and tests you should have. A screening is a test that checks for a disease when you have no symptoms.  A diet and exercise plan that is right for you. What should I know about screenings and tests to prevent falls? Screening and testing are the best ways to find a health problem early. Early diagnosis and treatment give you the best chance of managing medical conditions that are common after age 55. Certain conditions and lifestyle choices may make you more likely to have a fall. Your health care provider may recommend:  Regular vision checks. Poor vision and conditions such as cataracts can make you more likely to have a fall. If you wear glasses, make sure to get your prescription updated if your vision changes.  Medicine review. Work with your health care provider to regularly review all of the medicines you are taking, including over-the-counter medicines. Ask your health care provider about any side effects that may make you more likely to have a fall. Tell your health care provider if any medicines that you take make you feel dizzy or sleepy.  Osteoporosis  screening. Osteoporosis is a condition that causes the bones to get weaker. This can make the bones weak and cause them to break more easily.  Blood pressure screening. Blood pressure changes and medicines to control blood pressure can make you feel dizzy.  Strength and balance checks. Your health care provider may recommend certain tests to check your strength and balance while standing, walking, or changing positions.  Foot health exam. Foot pain and numbness, as well as not wearing proper footwear, can make you more likely to have a fall.  Depression screening. You may be more likely to have a fall if you have a fear of falling, feel emotionally low, or feel unable to do activities that you used to do.  Alcohol use screening. Using too much alcohol can affect your balance and may make you more likely to have a fall. What actions can I take to lower my risk of falls? General instructions  Talk with your health care provider about your risks for falling. Tell your health care provider if: ? You fall. Be sure to tell your health care provider about all falls, even ones that seem minor. ? You feel dizzy, sleepy, or off-balance.  Take over-the-counter and prescription medicines only as told by your health care provider. These include any supplements.  Eat a healthy diet and maintain a healthy weight. A healthy diet includes low-fat dairy products, low-fat (lean) meats, and fiber from whole grains, beans, and lots of fruits  and vegetables. Home safety  Remove any tripping hazards, such as rugs, cords, and clutter.  Install safety equipment such as grab bars in bathrooms and safety rails on stairs.  Keep rooms and walkways well-lit. Activity  Follow a regular exercise program to stay fit. This will help you maintain your balance. Ask your health care provider what types of exercise are appropriate for you.  If you need a cane or walker, use it as recommended by your health care  provider.  Wear supportive shoes that have nonskid soles.   Lifestyle  Do not drink alcohol if your health care provider tells you not to drink.  If you drink alcohol, limit how much you have: ? 0-1 drink a day for women. ? 0-2 drinks a day for men.  Be aware of how much alcohol is in your drink. In the [place] one drink equals one typical bottle of beer (12 oz), one-half glass of wine (5 oz), or one shot of hard liquor (1 oz).  Do not use any products that contain nicotine or tobacco, such as cigarettes and e-cigarettes. If you need help quitting, ask your health care provider. Summary  Having a healthy lifestyle and getting preventive care can help to protect your health and wellness after age 56.  Screening and testing are the best way to find a health problem early and help you avoid having a fall. Early diagnosis and treatment give you the best chance for managing medical conditions that are more common for people who are older than age 1.  Falls are a major cause of broken bones and head injuries in people who are older than age 69. Take precautions to prevent a fall at home.  Work with your health care provider to learn what changes you can make to improve your health and wellness and to prevent falls. This information is not intended to replace advice given to you by your health care provider. Make sure you discuss any questions you have with your health care provider. Document Revised: 03/02/2019 Document Reviewed: 09/22/2017 Elsevier Patient Education  2021 [employer].

## 2021-01-17 NOTE — Progress Notes (Addendum)
[place] at [facility] [address] [place], [place] [address] [phone]  Date:  01/20/2021   Name:  Latrisa Hellums   DOB:  15-Jan-1946   MRN:  [id number]  PCP:  Darreld Mclean, MD    Chief Complaint: Annual Exam   History of Present Illness:  Sueann Brownley is a 75 y.o. very pleasant female patient who presents with the following:  Here today for a CPE Last seen by myself in November  History of vaginal atrophy, osteopenia, vitamin D deficiency, family history of colon cancer Elevated blood sugar and last A1c 7%  She is married, 2 children and 5 grandchildren.  Lives in [place].  Retired from her work as a Veterinary surgeon  Stress test November  Nuclear stress EF: 72%.  There was no ST segment deviation noted during stress.  The study is normal.  This is a low risk study.  The left ventricular ejection fraction is hyperdynamic (>65%).  Lab Results  Component Value Date   HGBA1C 7.0 (H) 09/26/2020   She had a colon in 2019- she was supposed to repeat in 2021 per pt.  However her GI doc Dr Shana Chute may not be in her network?  She loves Dr.Shearin and does not want to find a new doctor, but she notes that expensive colonoscopy can be a concern.  We discussed this in detail, I advised her that I cannot tell her whether to continue seeing Dr. Shana Chute or to find a new gastroenterologist, but we do encourage her to get her colonoscopy done ASAP There is a strong family history of colon cancer so she does frequent colon screening  She has knee pain- she is seeing Dr Alvan Dame later on this week for her knees  She had surgery on her right knee in the past The left knee is bothering her more now -this makes it more difficult for her to exercise.  She has a treadmill and exercise bike but cannot really use them right now due to knee pain.  I suggested that she try water aerobics as a possible solution She  is frustrated that she is not able to lose weight partially due to difficulty with exercise  Wt Readings from Last 3 Encounters:  01/20/21 172 lb (78 kg)  10/30/20 173 lb (78.5 kg)  10/14/20 170 lb (77.1 kg)     mammo UTD shingrix done covid complete Flu shot done  dexa UTD    Patient Active Problem List   Diagnosis Date Noted  . Low back pain 11/19/2016  . History of vitamin D deficiency 09/30/2016  . H/O colonoscopy with polypectomy 09/30/2015  . Vaginal atrophy 09/25/2014  . Vertigo   . Osteopenia   . Acid reflux     Past Medical History:  Diagnosis Date  . Acid reflux   . Brenner tumor    benign  . Cancer ([place])   . Osteopenia   . Ovarian cyst, bilateral   . Vertigo   . Vitamin D deficiency     Past Surgical History:  Procedure Laterality Date  . CHOLECYSTECTOMY    . CYST EXCISION     CHEST   . OOPHORECTOMY  10/2006   DIAG LAP WITH BSO  . PELVIC LAPAROSCOPY  10/2006   DIAG LAP WITH BSO  . ROTATOR CUFF REPAIR      Social History   Tobacco Use  . Smoking status: Never Smoker  . Smokeless tobacco:  Never Used  Vaping Use  . Vaping Use: Never used  Substance Use Topics  . Alcohol use: No    Alcohol/week: 0.0 standard drinks  . Drug use: Never    Family History  Problem Relation Age of Onset  . Diabetes Mother   . Hypertension Mother   . Cancer Father        COLON CA  . Osteoporosis Sister   . Cancer Sister 29       PANCREATIC  . Cancer Maternal Aunt        OVARIAN CANCER  . Heart disease Maternal Aunt   . Hypertension Daughter   . Cancer Paternal Uncle        Colon cancer  . Breast cancer Neg Hx     Allergies  Allergen Reactions  . Vioxx [Rofecoxib] Rash    Medication list has been reviewed and updated.  Current Outpatient Medications on File Prior to Visit  Medication Sig Dispense Refill  . albuterol (VENTOLIN HFA) 108 (90 Base) MCG/ACT inhaler Inhale 2 puffs into the lungs every 6 (six) hours as needed for wheezing or  shortness of breath. 18 g 1  . cholecalciferol (VITAMIN D) 1000 units tablet Take 2,000 Units by mouth daily.     . Multiple Vitamin (MULTIVITAMIN) tablet Take 1 tablet by mouth daily.     No current facility-administered medications on file prior to visit.    Review of Systems:  As per HPI- otherwise negative.   Physical Examination: Vitals:   01/20/21 0934  BP: 126/68  Pulse: 83  Resp: 16  Temp: 98 F (36.7 C)  SpO2: 99%   Vitals:   01/20/21 0934  Weight: 172 lb (78 kg)  Height: 5' (1.524 m)   Body mass index is 33.59 kg/m. Ideal Body Weight: Weight in (lb) to have BMI = 25: 127.7  GEN: no acute distress.  Obese, looks well  HEENT: Atraumatic, Normocephalic.   Bilateral TM wnl, oropharynx normal.  PEERL,EOMI.   Ears and Nose: No external deformity. CV: RRR, No M/G/R. No JVD. No thrill. No extra heart sounds. PULM: CTA B, no wheezes, crackles, rhonchi. No retractions. No resp. distress. No accessory muscle use. ABD: S, NT, ND, +BS. No rebound. No HSM. EXTR: No c/c/e PSYCH: Normally interactive. Conversant.    Assessment and Plan: Physical exam  Dyslipidemia  Vitamin D deficiency - Plan: VITAMIN D 25 Hydroxy (Vit-D Deficiency, Fractures)  High blood sugar - Plan: Hemoglobin A1c  Osteopenia of multiple sites - Plan: raloxifene (EVISTA) 60 MG tablet  Chronic pain of left knee  Patient today for physical exam.  Encouraged healthy diet and exercise routine.  Suggested water aerobics as a possible alternative exercise form with her knee pain Refilled her Evista which she takes daily for osteopenia Check A1c and vitamin D today We discussed colonoscopy which is due Went over her other health maintenance items Will plan further follow- up pending labs.  This visit occurred during the SARS-CoV-2 public health emergency.  Safety protocols were in place, including screening questions prior to the visit, additional usage of staff PPE, and extensive cleaning of exam  room while observing appropriate contact time as indicated for disinfecting solutions.    Signed Lamar Blinks, MD   Received her labs as below, message to patient  Results for orders placed or performed in visit on 01/20/21  Hemoglobin A1c  Result Value Ref Range   Hgb A1c MFr Bld 6.6 (H) 4.6 - 6.5 %  VITAMIN D 25 Hydroxy (  Vit-D Deficiency, Fractures)  Result Value Ref Range   VITD 61.10 30.00 - 100.00 ng/mL

## 2021-01-19 ENCOUNTER — Other Ambulatory Visit: Payer: Self-pay | Admitting: Obstetrics & Gynecology

## 2021-01-20 ENCOUNTER — Encounter: Payer: Self-pay | Admitting: Family Medicine

## 2021-01-20 ENCOUNTER — Other Ambulatory Visit: Payer: Self-pay

## 2021-01-20 ENCOUNTER — Ambulatory Visit (INDEPENDENT_AMBULATORY_CARE_PROVIDER_SITE_OTHER): Payer: PPO | Admitting: Family Medicine

## 2021-01-20 VITALS — BP 126/68 | HR 83 | Temp 98.0°F | Resp 16 | Ht 60.0 in | Wt 172.0 lb

## 2021-01-20 DIAGNOSIS — R739 Hyperglycemia, unspecified: Secondary | ICD-10-CM | POA: Diagnosis not present

## 2021-01-20 DIAGNOSIS — E559 Vitamin D deficiency, unspecified: Secondary | ICD-10-CM

## 2021-01-20 DIAGNOSIS — E119 Type 2 diabetes mellitus without complications: Secondary | ICD-10-CM | POA: Diagnosis not present

## 2021-01-20 DIAGNOSIS — E785 Hyperlipidemia, unspecified: Secondary | ICD-10-CM

## 2021-01-20 DIAGNOSIS — Z0000 Encounter for general adult medical examination without abnormal findings: Secondary | ICD-10-CM

## 2021-01-20 DIAGNOSIS — M8589 Other specified disorders of bone density and structure, multiple sites: Secondary | ICD-10-CM

## 2021-01-20 DIAGNOSIS — G8929 Other chronic pain: Secondary | ICD-10-CM

## 2021-01-20 DIAGNOSIS — M25562 Pain in left knee: Secondary | ICD-10-CM

## 2021-01-20 LAB — VITAMIN D 25 HYDROXY (VIT D DEFICIENCY, FRACTURES): VITD: 61.1 ng/mL (ref 30.00–100.00)

## 2021-01-20 LAB — HEMOGLOBIN A1C: Hgb A1c MFr Bld: 6.6 % — ABNORMAL HIGH (ref 4.6–6.5)

## 2021-01-20 MED ORDER — RALOXIFENE HCL 60 MG PO TABS: 60.0000 mg | ORAL_TABLET | Freq: Every day | ORAL | 3 refills | Status: DC

## 2021-01-20 NOTE — Telephone Encounter (Signed)
Per bone density results 11/19/20, Rx sent.

## 2021-01-21 ENCOUNTER — Encounter: Payer: Self-pay | Admitting: Family Medicine

## 2021-01-22 DIAGNOSIS — M1712 Unilateral primary osteoarthritis, left knee: Secondary | ICD-10-CM | POA: Diagnosis not present

## 2021-02-19 DIAGNOSIS — M1712 Unilateral primary osteoarthritis, left knee: Secondary | ICD-10-CM | POA: Diagnosis not present

## 2021-02-19 DIAGNOSIS — M2392 Unspecified internal derangement of left knee: Secondary | ICD-10-CM | POA: Diagnosis not present

## 2021-03-20 DIAGNOSIS — Z800 Family history of malignant neoplasm of digestive organs: Secondary | ICD-10-CM | POA: Diagnosis not present

## 2021-03-20 DIAGNOSIS — K641 Second degree hemorrhoids: Secondary | ICD-10-CM | POA: Diagnosis not present

## 2021-03-20 DIAGNOSIS — K6389 Other specified diseases of intestine: Secondary | ICD-10-CM | POA: Diagnosis not present

## 2021-03-20 DIAGNOSIS — K5730 Diverticulosis of large intestine without perforation or abscess without bleeding: Secondary | ICD-10-CM | POA: Diagnosis not present

## 2021-03-20 DIAGNOSIS — Z86010 Personal history of colonic polyps: Secondary | ICD-10-CM | POA: Diagnosis not present

## 2021-03-20 DIAGNOSIS — Z1211 Encounter for screening for malignant neoplasm of colon: Secondary | ICD-10-CM | POA: Diagnosis not present

## 2021-03-20 DIAGNOSIS — K644 Residual hemorrhoidal skin tags: Secondary | ICD-10-CM | POA: Diagnosis not present

## 2021-03-22 DIAGNOSIS — M25562 Pain in left knee: Secondary | ICD-10-CM | POA: Diagnosis not present

## 2021-04-02 DIAGNOSIS — S83242A Other tear of medial meniscus, current injury, left knee, initial encounter: Secondary | ICD-10-CM | POA: Diagnosis not present

## 2021-04-02 DIAGNOSIS — M1712 Unilateral primary osteoarthritis, left knee: Secondary | ICD-10-CM | POA: Diagnosis not present

## 2021-04-08 DIAGNOSIS — N3010 Interstitial cystitis (chronic) without hematuria: Secondary | ICD-10-CM | POA: Diagnosis not present

## 2021-04-23 DIAGNOSIS — M1712 Unilateral primary osteoarthritis, left knee: Secondary | ICD-10-CM | POA: Diagnosis not present

## 2021-05-02 DIAGNOSIS — E785 Hyperlipidemia, unspecified: Secondary | ICD-10-CM | POA: Insufficient documentation

## 2021-05-02 NOTE — Progress Notes (Addendum)
[place] at [facility] [address], [place], [place] [address] [phone]  Date:  05/07/2021   Name:  Natalie Fowler   DOB:  Oct 18, 1946   MRN:  [id number]  PCP:  Darreld Mclean, MD    Chief Complaint: Surgical Clearance   History of Present Illness:  Audriella Blakeley is a 75 y.o. very pleasant female patient who presents with the following:  Patient seen today for preop clearance-she plans to have a total knee replacement per Dr. Alvan Dame next month. They plan to do her surgery on 7/12 Last seen by myself for physical in February  History of diabetes mellitus, vaginal atrophy, osteopenia, vitamin D deficiency, family history of colon cancer, dyslipidemia Most recent A1c showed good control of her blood sugars She had a nuclear stress test last November Nuclear stress EF: 72%. There was no ST segment deviation noted during stress. The study is normal. This is a low risk study. The left ventricular ejection fraction is hyperdynamic (>65%  Per preop forms-will complete today  Need BMI less than 40 Hemoglobin greater than 11 Albumin greater than 3 A1c less than 7.5 Smoking- never a smoker  Can update EKG She plans to get her shingrix 4th dose this fall   She does full housework with no CP or SOB- vacuum, climb stairs, etc  She is married - her daughter will help her at her surgery, as her husband is somewhat infirm Lab Results  Component Value Date   HGBA1C 6.6 (H) 01/20/2021    Patient Active Problem List   Diagnosis Date Noted   Dyslipidemia 05/02/2021   Diet-controlled diabetes mellitus ([place]) 01/20/2021   Low back pain 11/19/2016   History of vitamin D deficiency 09/30/2016   H/O colonoscopy with polypectomy 09/30/2015   Vaginal atrophy 09/25/2014   Vertigo    Osteopenia    Acid reflux     Past Medical History:  Diagnosis Date   Acid reflux    Brenner tumor    benign   Cancer ([place])    Osteopenia     Ovarian cyst, bilateral    Vertigo    Vitamin D deficiency     Past Surgical History:  Procedure Laterality Date   CHOLECYSTECTOMY     CYST EXCISION     CHEST    OOPHORECTOMY  10/2006   DIAG LAP WITH BSO   PELVIC LAPAROSCOPY  10/2006   DIAG LAP WITH BSO   ROTATOR CUFF REPAIR      Social History   Tobacco Use   Smoking status: Never   Smokeless tobacco: Never  Vaping Use   Vaping Use: Never used  Substance Use Topics   Alcohol use: No    Alcohol/week: 0.0 standard drinks   Drug use: Never    Family History  Problem Relation Age of Onset   Diabetes Mother    Hypertension Mother    Cancer Father        COLON CA   Osteoporosis Sister    Cancer Sister 38       PANCREATIC   Cancer Maternal Aunt        OVARIAN CANCER   Heart disease Maternal Aunt    Hypertension Daughter    Cancer Paternal Uncle        Colon cancer   Breast cancer Neg Hx     Allergies  Allergen Reactions   Vioxx [Rofecoxib] Rash    Medication list has been reviewed and  updated.  Current Outpatient Medications on File Prior to Visit  Medication Sig Dispense Refill   albuterol (VENTOLIN HFA) 108 (90 Base) MCG/ACT inhaler Inhale 2 puffs into the lungs every 6 (six) hours as needed for wheezing or shortness of breath. 18 g 1   cholecalciferol (VITAMIN D) 1000 units tablet Take 2,000 Units by mouth daily.      Multiple Vitamin (MULTIVITAMIN) tablet Take 1 tablet by mouth daily.     raloxifene (EVISTA) 60 MG tablet Take 1 tablet (60 mg total) by mouth daily. 90 tablet 3   No current facility-administered medications on file prior to visit.    Review of Systems:  As per HPI- otherwise negative.   Physical Examination: Vitals:   05/07/21 1314  BP: 126/82  Pulse: 92  Resp: 17  Temp: (!) 97.3 F (36.3 C)  SpO2: 99%   Vitals:   05/07/21 1314  Weight: 167 lb (75.8 kg)  Height: 5' (1.524 m)   Body mass index is 32.61 kg/m. Ideal Body Weight: Weight in (lb) to have BMI = 25:  127.7  GEN: no acute distress.mild obesity. Looks well  HEENT: Atraumatic, Normocephalic.  Ears and Nose: No external deformity. CV: RRR, No M/G/R. No JVD. No thrill. No extra heart sounds. PULM: CTA B, no wheezes, crackles, rhonchi. No retractions. No resp. distress. No accessory muscle use. ABD: S, NT, ND, +BS. No rebound. No HSM. EXTR: No c/c/e PSYCH: Normally interactive. Conversant.   EKG : SR with short PR- compared with EKG from 11/21 no significant change noted.   Assessment and Plan: Preoperative clearance - Plan: CBC, Comprehensive metabolic panel, Hemoglobin A1c, EKG 12-Lead  Dyslipidemia - Plan: Lipid panel  Diet-controlled diabetes mellitus ([place]) - Plan: Hemoglobin A1c Pt seen today for pre-operative eval Labs pending as above Able to do 4 METs of activity without CP or SOB  Assuming labs are ok should be able to clear her  This visit occurred during the SARS-CoV-2 public health emergency.  Safety protocols were in place, including screening questions prior to the visit, additional usage of staff PPE, and extensive cleaning of exam room while observing appropriate contact time as indicated for disinfecting solutions.   Signed Lamar Blinks, MD  Addnd 6/16-received her labs as below Completed preop form, message to patient  Results for orders placed or performed in visit on 05/07/21  CBC  Result Value Ref Range   WBC 7.1 4.0 - 10.5 K/uL   RBC 4.93 3.87 - 5.11 Mil/uL   Platelets 201.0 150.0 - 400.0 K/uL   Hemoglobin 14.6 12.0 - 15.0 g/dL   HCT 43.6 36.0 - 46.0 %   MCV 88.4 78.0 - 100.0 fl   MCHC 33.6 30.0 - 36.0 g/dL   RDW 13.3 11.5 - 15.5 %  Comprehensive metabolic panel  Result Value Ref Range   Sodium 140 135 - 145 mEq/L   Potassium 4.2 3.5 - 5.1 mEq/L   Chloride 102 96 - 112 mEq/L   CO2 29 19 - 32 mEq/L   Glucose, Bld 103 (H) 70 - 99 mg/dL   BUN 15 6 - 23 mg/dL   Creatinine, Ser 0.96 0.40 - 1.20 mg/dL   Total Bilirubin 0.6 0.2 - 1.2 mg/dL   Alkaline  Phosphatase 52 39 - 117 U/L   AST 19 0 - 37 U/L   ALT 28 0 - 35 U/L   Total Protein 6.9 6.0 - 8.3 g/dL   Albumin 4.2 3.5 - 5.2 g/dL   GFR 58.85 (L) >  60.00 mL/min   Calcium 9.6 8.4 - 10.5 mg/dL  Hemoglobin A1c  Result Value Ref Range   Hgb A1c MFr Bld 6.9 (H) 4.6 - 6.5 %  Lipid panel  Result Value Ref Range   Cholesterol 218 (H) 0 - 200 mg/dL   Triglycerides 173.0 (H) 0.0 - 149.0 mg/dL   HDL 45.70 >39.00 mg/dL   VLDL 34.6 0.0 - 40.0 mg/dL   LDL Cholesterol 137 (H) 0 - 99 mg/dL   Total CHOL/HDL Ratio 5    NonHDL 171.85

## 2021-05-07 ENCOUNTER — Encounter: Payer: Self-pay | Admitting: Family Medicine

## 2021-05-07 ENCOUNTER — Other Ambulatory Visit: Payer: Self-pay

## 2021-05-07 ENCOUNTER — Ambulatory Visit (INDEPENDENT_AMBULATORY_CARE_PROVIDER_SITE_OTHER): Payer: PPO | Admitting: Family Medicine

## 2021-05-07 VITALS — BP 126/82 | HR 92 | Temp 97.3°F | Resp 17 | Ht 60.0 in | Wt 167.0 lb

## 2021-05-07 DIAGNOSIS — E785 Hyperlipidemia, unspecified: Secondary | ICD-10-CM | POA: Diagnosis not present

## 2021-05-07 DIAGNOSIS — E119 Type 2 diabetes mellitus without complications: Secondary | ICD-10-CM

## 2021-05-07 DIAGNOSIS — Z01818 Encounter for other preprocedural examination: Secondary | ICD-10-CM

## 2021-05-07 NOTE — Patient Instructions (Signed)
Good to see you again today- I will be in touch with your labs,  assuming no surprises you should be all set for your operation next month

## 2021-05-08 ENCOUNTER — Encounter: Payer: Self-pay | Admitting: Family Medicine

## 2021-05-08 LAB — COMPREHENSIVE METABOLIC PANEL
ALT: 28 U/L (ref 0–35)
AST: 19 U/L (ref 0–37)
Albumin: 4.2 g/dL (ref 3.5–5.2)
Alkaline Phosphatase: 52 U/L (ref 39–117)
BUN: 15 mg/dL (ref 6–23)
CO2: 29 mEq/L (ref 19–32)
Calcium: 9.6 mg/dL (ref 8.4–10.5)
Chloride: 102 mEq/L (ref 96–112)
Creatinine, Ser: 0.96 mg/dL (ref 0.40–1.20)
GFR: 58.85 mL/min — ABNORMAL LOW (ref >60.00)
Glucose, Bld: 103 mg/dL — ABNORMAL HIGH (ref 70–99)
Potassium: 4.2 mEq/L (ref 3.5–5.1)
Sodium: 140 mEq/L (ref 135–145)
Total Bilirubin: 0.6 mg/dL (ref 0.2–1.2)
Total Protein: 6.9 g/dL (ref 6.0–8.3)

## 2021-05-08 LAB — LIPID PANEL
Cholesterol: 218 mg/dL — ABNORMAL HIGH (ref 0–200)
HDL: 45.7 mg/dL (ref >39.00)
LDL Cholesterol: 137 mg/dL — ABNORMAL HIGH (ref 0–99)
NonHDL: 171.85
Total CHOL/HDL Ratio: 5
Triglycerides: 173 mg/dL — ABNORMAL HIGH (ref 0.0–149.0)
VLDL: 34.6 mg/dL (ref 0.0–40.0)

## 2021-05-08 LAB — HEMOGLOBIN A1C: Hgb A1c MFr Bld: 6.9 % — ABNORMAL HIGH (ref 4.6–6.5)

## 2021-05-08 LAB — CBC
HCT: 43.6 % (ref 36.0–46.0)
Hemoglobin: 14.6 g/dL (ref 12.0–15.0)
MCHC: 33.6 g/dL (ref 30.0–36.0)
MCV: 88.4 fl (ref 78.0–100.0)
Platelets: 201 10*3/uL (ref 150.0–400.0)
RBC: 4.93 Mil/uL (ref 3.87–5.11)
RDW: 13.3 % (ref 11.5–15.5)
WBC: 7.1 10*3/uL (ref 4.0–10.5)

## 2021-05-19 ENCOUNTER — Ambulatory Visit (HOSPITAL_BASED_OUTPATIENT_CLINIC_OR_DEPARTMENT_OTHER)
Admission: RE | Admit: 2021-05-19 | Discharge: 2021-05-19 | Disposition: A | Discharge status: Home / Self Care | Payer: PPO | Source: Ambulatory Visit | Attending: Family Medicine | Admitting: Family Medicine

## 2021-05-19 ENCOUNTER — Encounter: Payer: Self-pay | Admitting: Family Medicine

## 2021-05-19 ENCOUNTER — Other Ambulatory Visit: Payer: Self-pay

## 2021-05-19 ENCOUNTER — Ambulatory Visit (INDEPENDENT_AMBULATORY_CARE_PROVIDER_SITE_OTHER): Payer: PPO | Admitting: Family Medicine

## 2021-05-19 VITALS — BP 140/88 | HR 98 | Temp 99.1°F | Resp 18 | Ht 60.0 in | Wt 172.4 lb

## 2021-05-19 DIAGNOSIS — R059 Cough, unspecified: Secondary | ICD-10-CM

## 2021-05-19 MED ORDER — DOXYCYCLINE HYCLATE 100 MG PO CAPS: 100.0000 mg | ORAL_CAPSULE | Freq: Two times a day (BID) | ORAL | 0 refills | Status: DC

## 2021-05-19 NOTE — Patient Instructions (Signed)

## 2021-05-19 NOTE — Progress Notes (Signed)
Patient ID: Natalie Fowler, female    DOB: 1946-03-20  Age: 75 y.o. MRN: [id number]    Subjective:  Subjective  HPI Natalie Fowler presents for an office visit today. She complains of cough and congestion x 1 week. She notes that she only cough at night, when she lay down. She reports that the congestion starts from the bottom of her sternum up. She denies any GERD. She states that it had worsen over time. She reports that she had similar Sxs last year (05/09/2020) and was dx with pneumonia. Pt was given doxycycline and her surgery was cancelled last year. Pt is worried about her lungs since she is preparing for total knee arthroplasty on 06/03/2021. She denies any chest pain, SOB, fever, abdominal pain, chills, sore throat, dysuria, urinary incontinence, back pain, HA, or N/V/D at this time.   Review of Systems  Constitutional:  Negative for chills, fatigue and fever.  HENT:  Positive for congestion. Negative for ear pain, rhinorrhea, sinus pressure, sinus pain, sore throat and tinnitus.   Eyes:  Negative for pain.  Respiratory:  Positive for cough. Negative for shortness of breath and wheezing.   Cardiovascular:  Negative for chest pain.  Gastrointestinal:  Negative for abdominal pain, anal bleeding, constipation, diarrhea, nausea and vomiting.  Genitourinary:  Negative for flank pain.  Musculoskeletal:  Negative for back pain and neck pain.  Skin:  Negative for rash.  Neurological:  Negative for seizures, weakness, light-headedness, numbness and headaches.   History Past Medical History:  Diagnosis Date   Acid reflux    Brenner tumor    benign   Cancer ([place])    Osteopenia    Ovarian cyst, bilateral    Vertigo    Vitamin D deficiency     She has a past surgical history that includes Cholecystectomy; Oophorectomy (10/2006); Pelvic laparoscopy (10/2006); Rotator cuff repair; and Cyst excision.   Her family history includes Cancer in her father, maternal aunt, and paternal uncle;  Cancer (age of onset: 56) in her sister; Diabetes in her mother; Heart disease in her maternal aunt; Hypertension in her daughter and mother; Osteoporosis in her sister.She reports that she has never smoked. She has never used smokeless tobacco. She reports that she does not drink alcohol and does not use drugs.  Current Outpatient Medications on File Prior to Visit  Medication Sig Dispense Refill   albuterol (VENTOLIN HFA) 108 (90 Base) MCG/ACT inhaler Inhale 2 puffs into the lungs every 6 (six) hours as needed for wheezing or shortness of breath. 18 g 1   cholecalciferol (VITAMIN D) 1000 units tablet Take 2,000 Units by mouth daily.      Multiple Vitamin (MULTIVITAMIN) tablet Take 1 tablet by mouth daily.     raloxifene (EVISTA) 60 MG tablet Take 1 tablet (60 mg total) by mouth daily. 90 tablet 3   No current facility-administered medications on file prior to visit.     Objective:  Objective  Physical Exam Vitals and nursing note reviewed.  Constitutional:      General: She is not in acute distress.    Appearance: Normal appearance. She is well-developed. She is not ill-appearing.  HENT:     Head: Normocephalic and atraumatic.     Right Ear: External ear normal.     Left Ear: External ear normal.     Nose: Nose normal.  Eyes:     General:        Right eye: No discharge.        Left  eye: No discharge.     Extraocular Movements: Extraocular movements intact.     Pupils: Pupils are equal, round, and reactive to light.  Cardiovascular:     Rate and Rhythm: Normal rate and regular rhythm.     Pulses: Normal pulses.     Heart sounds: Normal heart sounds. No murmur heard.   No friction rub. No gallop.  Pulmonary:     Effort: Pulmonary effort is normal. No respiratory distress.     Breath sounds: Normal breath sounds. No stridor. No wheezing, rhonchi or rales.  Chest:     Chest wall: No tenderness.  Abdominal:     General: Bowel sounds are normal. There is no distension.      Palpations: Abdomen is soft. There is no mass.     Tenderness: There is no abdominal tenderness. There is no guarding or rebound.     Hernia: No hernia is present.  Musculoskeletal:        General: Normal range of motion.     Cervical back: Normal range of motion and neck supple.     Right lower leg: No edema.     Left lower leg: No edema.  Skin:    General: Skin is warm and dry.  Neurological:     Mental Status: She is alert and oriented to person, place, and time.  Psychiatric:        Behavior: Behavior normal.        Thought Content: Thought content normal.   BP 140/88 (BP Location: Right Arm, Patient Position: Sitting, Cuff Size: Large)   Pulse 98   Temp 99.1 F (37.3 C) (Oral)   Resp 18   Ht 5' (1.524 m)   Wt 172 lb 6.4 oz (78.2 kg)   SpO2 97%   BMI 33.67 kg/m  Wt Readings from Last 3 Encounters:  05/19/21 172 lb 6.4 oz (78.2 kg)  05/07/21 167 lb (75.8 kg)  01/20/21 172 lb (78 kg)     Lab Results  Component Value Date   WBC 7.1 05/07/2021   HGB 14.6 05/07/2021   HCT 43.6 05/07/2021   PLT 201.0 05/07/2021   GLUCOSE 103 (H) 05/07/2021   CHOL 218 (H) 05/07/2021   TRIG 173.0 (H) 05/07/2021   HDL 45.70 05/07/2021   LDLDIRECT 98.0 04/08/2020   LDLCALC 137 (H) 05/07/2021   ALT 28 05/07/2021   AST 19 05/07/2021   NA 140 05/07/2021   K 4.2 05/07/2021   CL 102 05/07/2021   CREATININE 0.96 05/07/2021   BUN 15 05/07/2021   CO2 29 05/07/2021   TSH 1.10 10/30/2019   HGBA1C 6.9 (H) 05/07/2021   MICROALBUR 0.96 10/02/2019    MM 3D SCREEN BREAST BILATERAL  Result Date: 10/07/2020 CLINICAL DATA:  Screening. EXAM: DIGITAL SCREENING BILATERAL MAMMOGRAM WITH TOMO AND CAD COMPARISON:  Previous exam(s). ACR Breast Density Category b: There are scattered areas of fibroglandular density. FINDINGS: There are no findings suspicious for malignancy. Images were processed with CAD. IMPRESSION: No mammographic evidence of malignancy. A result letter of this screening mammogram will  be mailed directly to the patient. RECOMMENDATION: Screening mammogram in one year. (Code:SM-B-01Y) BI-RADS CATEGORY  1: Negative. Electronically Signed   By: Franki Cabot M.D.   On: 10/07/2020 07:49     Assessment & Plan:  Plan   Meds ordered this encounter  Medications   doxycycline (VIBRAMYCIN) 100 MG capsule    Sig: Take 1 capsule (100 mg total) by mouth 2 (two) times daily.  Dispense:  20 capsule    Refill:  0    Problem List Items Addressed This Visit   None Visit Diagnoses     Cough    -  Primary   Relevant Medications   doxycycline (VIBRAMYCIN) 100 MG capsule   Other Relevant Orders   Novel Coronavirus, NAA ([facility])   DG Chest 2 View (Completed)       Follow-up: No follow-ups on file.   I,Gordon Zheng,acting as a Education administrator for [employer], DO.,have documented all relevant documentation on the behalf of Ann Held, DO,as directed by  Ann Held, DO while in the presence of [place], DO, have reviewed all documentation for this visit. The documentation on 05/19/21 for the exam, diagnosis, procedures, and orders are all accurate and complete.

## 2021-05-20 LAB — NOVEL CORONAVIRUS, NAA: SARS-CoV-2, NAA: NOT DETECTED

## 2021-05-20 LAB — SARS-COV-2, NAA 2 DAY TAT

## 2021-05-27 NOTE — Progress Notes (Signed)
Please enter orders for PAT visit scheduled for 05-28-21

## 2021-05-27 NOTE — Progress Notes (Addendum)
COVID Vaccine Completed: Yes x3 Date COVID Vaccine completed: 12-29-19 01-23-20 Has received booster: 08-25-20 COVID vaccine manufacturer: Pfizer     Date of COVID positive in last 90 days: N/A  PCP - Silvestre Mesi, MD Cardiologist - N/A  Chest x-ray - 05-19-21 Epic EKG - 05-07-21 Epic Stress Test - 10-14-20 Epic ECHO - N/A Cardiac Cath - N/A Pacemaker/ICD device last checked: Spinal Cord Stimulator:  Sleep Study - N/A CPAP -   Fasting Blood Sugar - N/A Checks Blood Sugar _____ times a day  Blood Thinner Instructions: Aspirin Instructions:  ASA 81 mg  Last Dose:  05-26-21  Activity level:  Can go up a flight of stairs and perform activities of daily living without stopping and without symptoms of chest pain or shortness of breath. Patient states that she has a two story home and is able to go up and down stairs multiple times a day without chest pain or SOB.   Anesthesia review:  Hx of chest pain, evaluated in 2021.  Stress test ordered by PCP, not referred to cardiology.  Patient states that she has not had chest pain in months.    Patient denies shortness of breath, fever, cough and chest pain at PAT appointment   Patient verbalized understanding of instructions that were given to them at the PAT appointment. Patient was also instructed that they will need to review over the PAT instructions again at home before surgery.

## 2021-05-27 NOTE — H&P (Signed)
TOTAL KNEE ADMISSION H&P  Patient is being admitted for left total knee arthroplasty.  Subjective:  Chief Complaint:left knee pain.  HPI: Natalie Fowler, 75 y.o. female, has a history of pain and functional disability in the left knee due to arthritis and has failed non-surgical conservative treatments for greater than 12 weeks to includecorticosteriod injections and activity modification.  Onset of symptoms was gradual, starting 2 years ago with gradually worsening course since that time. The patient noted no past surgery on the left knee(s).  Patient currently rates pain in the left knee(s) at 8 out of 10 with activity. Patient has worsening of pain with activity and weight bearing and pain that interferes with activities of daily living.  Patient has evidence of joint space narrowing by imaging studies. There is no active infection.  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   Current Outpatient Medications  Medication Sig Dispense Refill Last Dose   acetaminophen (TYLENOL) 325 MG tablet Take 650 mg by mouth every 6 (six) hours as needed for moderate pain.      albuterol (VENTOLIN HFA) 108 (90 Base) MCG/ACT inhaler Inhale 2 puffs into the lungs  every 6 (six) hours as needed for wheezing or shortness of breath. 18 g 1    Apoaequorin (PREVAGEN PO) Take 1 capsule by mouth daily.      aspirin EC 81 MG tablet Take 81 mg by mouth daily. Swallow whole.      cholecalciferol (VITAMIN D) 1000 units tablet Take 1,000 Units by mouth daily.      Misc Natural Product [facility] EYE DROPS #1 OP) Place 1 drop into both eyes daily as needed (itching/irritation).      raloxifene (EVISTA) 60 MG tablet Take 1 tablet (60 mg total) by mouth daily. 90 tablet 3    doxycycline (VIBRAMYCIN) 100 MG capsule Take 1 capsule (100 mg total) by mouth 2 (two) times daily. 20 capsule 0    Allergies  Allergen Reactions   Vioxx [Rofecoxib] Rash    Social History   Tobacco Use   Smoking status: Never   Smokeless tobacco: Never  Substance Use Topics   Alcohol use: No    Alcohol/week: 0.0 standard drinks    Family History  Problem Relation Age of Onset   Diabetes Mother    Hypertension Mother    Cancer Father        COLON CA   Osteoporosis Sister    Cancer Sister 52       PANCREATIC   Cancer Maternal Aunt  OVARIAN CANCER   Heart disease Maternal Aunt    Hypertension Daughter    Cancer Paternal Uncle        Colon cancer   Breast cancer Neg Hx      Review of Systems  Constitutional:  Negative for chills and fever.  Respiratory:  Negative for cough and shortness of breath.   Cardiovascular:  Negative for chest pain.  Gastrointestinal:  Negative for nausea and vomiting.  Musculoskeletal:  Positive for arthralgias.   Objective:  Physical Exam Well nourished and well developed. General: Alert and oriented x3, cooperative and pleasant, no acute distress. Head: normocephalic, atraumatic, neck supple. Eyes: EOMI.  Musculoskeletal: Left knee exam: No palpable effusion, warmth erythema Tenderness more medially than lateral Full knee extension and flexion Stable medial lateral collateral ligaments Mild crepitation anteriorly Neurovascular  tact distally without lower extremity edema, erythema or calf tenderness Normal ipsilateral left hip exam without groin pain or referred pain  Calves soft and nontender. Motor function intact in LE. Strength 5/5 LE bilaterally. Neuro: Distal pulses 2+. Sensation to light touch intact in LE.  Vital signs in last 24 hours:    Labs:   Estimated body mass index is 33.67 kg/m as calculated from the following:   Height as of 05/19/21: 5' (1.524 m).   Weight as of 05/19/21: 78.2 kg.   Imaging Review Plain radiographs demonstrate severe degenerative joint disease of the left knee(s). The overall alignment isneutral. The bone quality appears to be adequate for age and reported activity level.   Assessment/Plan:  End stage arthritis, left knee   The patient history, physical examination, clinical judgment of the provider and imaging studies are consistent with end stage degenerative joint disease of the left knee(s) and total knee arthroplasty is deemed medically necessary. The treatment options including medical management, injection therapy arthroscopy and arthroplasty were discussed at length. The risks and benefits of total knee arthroplasty were presented and reviewed. The risks due to aseptic loosening, infection, stiffness, patella tracking problems, thromboembolic complications and other imponderables were discussed. The patient acknowledged the explanation, agreed to proceed with the plan and consent was signed. Patient is being admitted for inpatient treatment for surgery, pain control, PT, OT, prophylactic antibiotics, VTE prophylaxis, progressive ambulation and ADL's and discharge planning. The patient is planning to be discharged  home.   Therapy Plans: outpatient therapy at [facility] PT Disposition: Home with daughter & husband Planned DVT Prophylaxis: aspirin 81mg  BID DME needed: walker, 3-n-1 PCP: Dr. Silvestre Mesi, clearance received TXA: IV Allergies: Vioxx -  rash Anesthesia Concerns: vomiting BMI: 33.1 Last HgbA1c: Not diabetic.  Other: - Tylenol, celebrex, robaxin, oxycodone, zofran  Patient's anticipated LOS is less than 2 midnights, meeting these requirements: - Younger than 64 - Lives within 1 hour of care - Has a competent adult at home to recover with post-op recover - NO history of  - Chronic pain requiring opiods  - Diabetes  - Coronary Artery Disease  - Heart failure  - Heart attack  - Stroke  - DVT/VTE  - Cardiac arrhythmia  - Respiratory Failure/COPD  - Renal failure  - Anemia  - Advanced Liver disease  Griffith Citron, PA-C Orthopedic Surgery [facility] [place] [phone]

## 2021-05-28 ENCOUNTER — Other Ambulatory Visit: Payer: Self-pay

## 2021-05-28 ENCOUNTER — Encounter (HOSPITAL_COMMUNITY): Payer: Self-pay

## 2021-05-28 ENCOUNTER — Encounter (HOSPITAL_COMMUNITY)
Admission: RE | Admit: 2021-05-28 | Discharge: 2021-05-28 | Disposition: A | Discharge status: Home / Self Care | Payer: PPO | Source: Ambulatory Visit | Attending: Orthopedic Surgery | Admitting: Orthopedic Surgery

## 2021-05-28 DIAGNOSIS — Z01812 Encounter for preprocedural laboratory examination: Secondary | ICD-10-CM | POA: Diagnosis not present

## 2021-05-28 HISTORY — DX: Other specified postprocedural states: Z98.890

## 2021-05-28 HISTORY — DX: Unspecified osteoarthritis, unspecified site: M19.90

## 2021-05-28 HISTORY — DX: Prediabetes: R73.03

## 2021-05-28 HISTORY — DX: Pneumonia, unspecified organism: J18.9

## 2021-05-28 HISTORY — DX: Other specified postprocedural states: R11.2

## 2021-05-28 LAB — SURGICAL PCR SCREEN
MRSA, PCR: NEGATIVE
Staphylococcus aureus: NEGATIVE

## 2021-05-28 NOTE — Patient Instructions (Signed)
DUE TO COVID-19 ONLY ONE VISITOR IS ALLOWED TO COME WITH YOU AND STAY IN THE WAITING ROOM ONLY DURING PRE OP AND PROCEDURE.   **NO VISITORS ARE ALLOWED IN THE SHORT STAY AREA OR RECOVERY ROOM!!**  IF YOU WILL BE ADMITTED INTO THE HOSPITAL YOU ARE ALLOWED ONLY TWO SUPPORT PEOPLE DURING VISITATION HOURS ONLY (10AM -8PM)   The support person(s) may change daily. The support person(s) must pass our screening, gel in and out, and wear a mask at all times, including in the patient's room. Patients must also wear a mask when staff or their support person are in the room.  No visitors under the age of 1. Any visitor under the age of 44 must be accompanied by an adult.    COVID SWAB TESTING MUST BE COMPLETED ON:  Friday, 05-30-21 @ 9:00 AM   [address] [place], [place] [address]   You are not required to quarantine, however you are required to wear a well-fitted mask when you are out and around people not in your household.  Hand Hygiene often Do NOT share personal items Notify your provider if you are in close contact with someone who has COVID or you develop fever 100.4 or greater, new onset of sneezing, cough, sore throat, shortness of breath or body aches.    Your procedure is scheduled on:  Tuesday, 06-03-21   Report to [facility] Main  Entrance    Report to admitting at 7:30 AM   Call this number if you have problems the morning of surgery [phone]   Do not eat food :After Midnight.   May have liquids until 6:30 AM day of surgery  CLEAR LIQUID DIET  Foods Allowed                                                                     Foods Excluded  Water, Black Coffee and tea, regular and decaf              liquids that you cannot  Plain Jell-O in any flavor  (No red)                                    see through such as: Fruit ices (not with fruit pulp)                                      milk, soups, orange juice              Iced Popsicles (No red)                                       All solid food                                   Apple juices Sports drinks like Gatorade (No red) Lightly seasoned clear broth or consume(fat free) Sugar, honey syrup     Oral  Hygiene is also important to reduce your risk of infection.                                    Remember - BRUSH YOUR TEETH THE MORNING OF SURGERY WITH YOUR REGULAR TOOTHPASTE   Do NOT smoke after Midnight   Take these medicines the morning of surgery with A SIP OF WATER:  Albuterol inhaler, Raloxifene                              You may not have any metal on your body including hair pins, jewelry, and body piercing             Do not wear make-up, lotions, powders, perfumes or deodorant  Do not wear nail polish including gel and S&S, artificial/acrylic nails, or any other type of covering on natural nails including finger and toenails. If you have artificial nails, gel coating, etc. that needs to be removed by a nail salon please have this removed prior to surgery or surgery may need to be canceled/ delayed if the surgeon/ anesthesia feels like they are unable to be safely monitored.   Do not shave  48 hours prior to surgery.    Do not bring valuables to the hospital. [place].   Contacts, dentures or bridgework may not be worn into surgery.   Bring small overnight bag day of surgery.   Special Instructions: Bring a copy of your healthcare power of attorney and living will documents         the day of surgery if you haven't scanned them in before.   Please read over the following fact sheets you were given: IF YOU HAVE QUESTIONS ABOUT YOUR PRE OP INSTRUCTIONS PLEASE CALL [place] - Preparing for Surgery Before surgery, you can play an important role.  Because skin is not sterile, your skin needs to be as free of germs as possible.  You can reduce the number of germs on your skin by washing with CHG (chlorahexidine gluconate)  soap before surgery.  CHG is an antiseptic cleaner which kills germs and bonds with the skin to continue killing germs even after washing. Please DO NOT use if you have an allergy to CHG or antibacterial soaps.  If your skin becomes reddened/irritated stop using the CHG and inform your nurse when you arrive at Short Stay. Do not shave (including legs and underarms) for at least 48 hours prior to the first CHG shower.  You may shave your face/neck.  Please follow these instructions carefully:  1.  Shower with CHG Soap the night before surgery and the  morning of surgery.  2.  If you choose to wash your hair, wash your hair first as usual with your normal  shampoo.  3.  After you shampoo, rinse your hair and body thoroughly to remove the shampoo.                             4.  Use CHG as you would any other liquid soap.  You can apply chg directly to the skin and wash.  Gently with a scrungie or clean washcloth.  5.  Apply the CHG Soap to your body ONLY FROM THE NECK DOWN.  Do   not use on face/ open                           Wound or open sores. Avoid contact with eyes, ears mouth and   genitals (private parts).                       Wash face,  Genitals (private parts) with your normal soap.             6.  Wash thoroughly, paying special attention to the area where your    surgery  will be performed.  7.  Thoroughly rinse your body with warm water from the neck down.  8.  DO NOT shower/wash with your normal soap after using and rinsing off the CHG Soap.                9.  Pat yourself dry with a clean towel.            10.  Wear clean pajamas.            11.  Place clean sheets on your bed the night of your first shower and do not  sleep with pets. Day of Surgery : Do not apply any lotions/deodorants the morning of surgery.  Please wear clean clothes to the hospital/surgery center.  FAILURE TO FOLLOW THESE INSTRUCTIONS MAY RESULT IN THE CANCELLATION OF YOUR SURGERY  PATIENT  SIGNATURE_________________________________  NURSE SIGNATURE__________________________________  ________________________________________________________________________

## 2021-05-30 ENCOUNTER — Other Ambulatory Visit (HOSPITAL_COMMUNITY)
Admission: RE | Admit: 2021-05-30 | Discharge: 2021-05-30 | Disposition: A | Discharge status: Home / Self Care | Payer: PPO | Source: Ambulatory Visit | Attending: Orthopedic Surgery | Admitting: Orthopedic Surgery

## 2021-05-30 DIAGNOSIS — Z01812 Encounter for preprocedural laboratory examination: Secondary | ICD-10-CM | POA: Insufficient documentation

## 2021-05-30 DIAGNOSIS — Z20822 Contact with and (suspected) exposure to covid-19: Secondary | ICD-10-CM | POA: Diagnosis not present

## 2021-05-30 LAB — SARS CORONAVIRUS 2 (TAT 6-24 HRS): SARS Coronavirus 2: NEGATIVE

## 2021-06-03 ENCOUNTER — Ambulatory Visit (HOSPITAL_COMMUNITY): Payer: PPO | Admitting: Emergency Medicine

## 2021-06-03 ENCOUNTER — Ambulatory Visit (HOSPITAL_COMMUNITY): Payer: PPO | Admitting: Certified Registered Nurse Anesthetist

## 2021-06-03 ENCOUNTER — Encounter (HOSPITAL_COMMUNITY): Payer: Self-pay | Admitting: Orthopedic Surgery

## 2021-06-03 ENCOUNTER — Other Ambulatory Visit: Payer: Self-pay

## 2021-06-03 ENCOUNTER — Observation Stay (HOSPITAL_COMMUNITY)
Admission: RE | Admit: 2021-06-03 | Discharge: 2021-06-04 | Disposition: A | Discharge status: Home / Self Care | Payer: PPO | Source: Ambulatory Visit | Attending: Orthopedic Surgery | Admitting: Orthopedic Surgery

## 2021-06-03 ENCOUNTER — Encounter (HOSPITAL_COMMUNITY)
Admission: RE | Disposition: A | Discharge status: Home / Self Care | Payer: Self-pay | Source: Ambulatory Visit | Attending: Orthopedic Surgery

## 2021-06-03 DIAGNOSIS — G8918 Other acute postprocedural pain: Secondary | ICD-10-CM | POA: Diagnosis not present

## 2021-06-03 DIAGNOSIS — E119 Type 2 diabetes mellitus without complications: Secondary | ICD-10-CM | POA: Diagnosis not present

## 2021-06-03 DIAGNOSIS — Z7982 Long term (current) use of aspirin: Secondary | ICD-10-CM | POA: Insufficient documentation

## 2021-06-03 DIAGNOSIS — M1712 Unilateral primary osteoarthritis, left knee: Secondary | ICD-10-CM | POA: Diagnosis not present

## 2021-06-03 DIAGNOSIS — K219 Gastro-esophageal reflux disease without esophagitis: Secondary | ICD-10-CM | POA: Diagnosis not present

## 2021-06-03 DIAGNOSIS — Z96652 Presence of left artificial knee joint: Secondary | ICD-10-CM

## 2021-06-03 HISTORY — PX: TOTAL KNEE ARTHROPLASTY: SHX125

## 2021-06-03 LAB — TYPE AND SCREEN
ABO/RH(D): A POS
Antibody Screen: NEGATIVE

## 2021-06-03 LAB — ABO/RH: ABO/RH(D): A POS

## 2021-06-03 SURGERY — ARTHROPLASTY, KNEE, TOTAL
Anesthesia: Spinal | Site: Knee | Laterality: Left

## 2021-06-03 MED ORDER — OXYCODONE HCL 5 MG/5ML PO SOLN: 5.0000 mg | Freq: Once | ORAL | Status: DC | PRN

## 2021-06-03 MED ORDER — ROPIVACAINE HCL 7.5 MG/ML IJ SOLN
INTRAMUSCULAR | Status: DC | PRN
  Administered 2021-06-03: 20 mL via PERINEURAL

## 2021-06-03 MED ORDER — LACTATED RINGERS IV SOLN: INTRAVENOUS | Status: DC

## 2021-06-03 MED ORDER — METOCLOPRAMIDE HCL 5 MG/ML IJ SOLN: 5.0000 mg | Freq: Three times a day (TID) | INTRAMUSCULAR | Status: DC | PRN

## 2021-06-03 MED ORDER — MIDAZOLAM HCL 2 MG/2ML IJ SOLN
1.0000 mg | INTRAMUSCULAR | Status: AC
  Administered 2021-06-03: 1 mg via INTRAVENOUS
  Filled 2021-06-03: qty 2

## 2021-06-03 MED ORDER — OXYCODONE HCL 5 MG PO TABS: 5.0000 mg | ORAL_TABLET | Freq: Once | ORAL | Status: DC | PRN

## 2021-06-03 MED ORDER — BUPIVACAINE-EPINEPHRINE (PF) 0.25% -1:200000 IJ SOLN
INTRAMUSCULAR | Status: AC
  Filled 2021-06-03: qty 30

## 2021-06-03 MED ORDER — ONDANSETRON HCL 4 MG/2ML IJ SOLN: 4.0000 mg | Freq: Once | INTRAMUSCULAR | Status: DC | PRN

## 2021-06-03 MED ORDER — ONDANSETRON HCL 4 MG/2ML IJ SOLN: 4.0000 mg | Freq: Four times a day (QID) | INTRAMUSCULAR | Status: DC | PRN

## 2021-06-03 MED ORDER — BUPIVACAINE IN DEXTROSE 0.75-8.25 % IT SOLN
INTRATHECAL | Status: DC | PRN
  Administered 2021-06-03: 1.4 mL via INTRATHECAL

## 2021-06-03 MED ORDER — OXYCODONE HCL 5 MG PO TABS
5.0000 mg | ORAL_TABLET | ORAL | Status: DC | PRN
  Administered 2021-06-03 (×2): 5 mg via ORAL
  Administered 2021-06-03: 10 mg via ORAL
  Administered 2021-06-04: 5 mg via ORAL
  Administered 2021-06-04: 10 mg via ORAL
  Filled 2021-06-03: qty 1
  Filled 2021-06-03: qty 2
  Filled 2021-06-03: qty 1
  Filled 2021-06-03: qty 2
  Filled 2021-06-03: qty 1

## 2021-06-03 MED ORDER — MIDAZOLAM HCL 2 MG/2ML IJ SOLN
INTRAMUSCULAR | Status: AC
  Filled 2021-06-03: qty 2

## 2021-06-03 MED ORDER — FENTANYL CITRATE (PF) 100 MCG/2ML IJ SOLN: 25.0000 ug | INTRAMUSCULAR | Status: DC | PRN

## 2021-06-03 MED ORDER — DEXAMETHASONE SODIUM PHOSPHATE 10 MG/ML IJ SOLN
INTRAMUSCULAR | Status: DC | PRN
  Administered 2021-06-03: 8 mg via INTRAVENOUS

## 2021-06-03 MED ORDER — BUPIVACAINE-EPINEPHRINE (PF) 0.25% -1:200000 IJ SOLN
INTRAMUSCULAR | Status: DC | PRN
  Administered 2021-06-03: 30 mL

## 2021-06-03 MED ORDER — MIDAZOLAM HCL 2 MG/2ML IJ SOLN
INTRAMUSCULAR | Status: DC | PRN
  Administered 2021-06-03: 2 mg via INTRAVENOUS

## 2021-06-03 MED ORDER — TRANEXAMIC ACID-NACL 1000-0.7 MG/100ML-% IV SOLN
INTRAVENOUS | Status: AC
  Filled 2021-06-03: qty 100

## 2021-06-03 MED ORDER — SODIUM CHLORIDE 0.9 % IV SOLN
INTRAVENOUS | Status: AC
  Filled 2021-06-03: qty 2

## 2021-06-03 MED ORDER — MENTHOL 3 MG MT LOZG: 1.0000 | LOZENGE | OROMUCOSAL | Status: DC | PRN

## 2021-06-03 MED ORDER — DEXMEDETOMIDINE (PRECEDEX) IN NS 20 MCG/5ML (4 MCG/ML) IV SYRINGE
PREFILLED_SYRINGE | INTRAVENOUS | Status: AC
  Filled 2021-06-03: qty 5

## 2021-06-03 MED ORDER — 0.9 % SODIUM CHLORIDE (POUR BTL) OPTIME
TOPICAL | Status: DC | PRN
  Administered 2021-06-03: 1000 mL

## 2021-06-03 MED ORDER — TRANEXAMIC ACID-NACL 1000-0.7 MG/100ML-% IV SOLN
1000.0000 mg | Freq: Once | INTRAVENOUS | Status: AC
  Administered 2021-06-03: 1000 mg via INTRAVENOUS
  Filled 2021-06-03: qty 100

## 2021-06-03 MED ORDER — DEXAMETHASONE SODIUM PHOSPHATE 10 MG/ML IJ SOLN
10.0000 mg | Freq: Once | INTRAMUSCULAR | Status: AC
  Administered 2021-06-04: 10 mg via INTRAVENOUS
  Filled 2021-06-03: qty 1

## 2021-06-03 MED ORDER — ONDANSETRON HCL 4 MG PO TABS: 4.0000 mg | ORAL_TABLET | Freq: Four times a day (QID) | ORAL | Status: DC | PRN

## 2021-06-03 MED ORDER — PHENYLEPHRINE HCL-NACL 10-0.9 MG/250ML-% IV SOLN
INTRAVENOUS | Status: DC | PRN
  Administered 2021-06-03: 15 ug/min via INTRAVENOUS

## 2021-06-03 MED ORDER — OXYCODONE HCL 5 MG PO TABS
10.0000 mg | ORAL_TABLET | ORAL | Status: DC | PRN
  Administered 2021-06-04: 15 mg via ORAL
  Filled 2021-06-03: qty 3

## 2021-06-03 MED ORDER — ALBUTEROL SULFATE (2.5 MG/3ML) 0.083% IN NEBU: 2.5000 mg | INHALATION_SOLUTION | Freq: Four times a day (QID) | RESPIRATORY_TRACT | Status: DC | PRN

## 2021-06-03 MED ORDER — PROPOFOL 500 MG/50ML IV EMUL
INTRAVENOUS | Status: DC | PRN
  Administered 2021-06-03: 100 ug/kg/min via INTRAVENOUS
  Administered 2021-06-03: 65 ug/kg/min via INTRAVENOUS

## 2021-06-03 MED ORDER — SODIUM CHLORIDE 0.9 % IV SOLN
2.0000 g | Freq: Four times a day (QID) | INTRAVENOUS | Status: AC
  Administered 2021-06-03 – 2021-06-04 (×2): 2 g via INTRAVENOUS
  Filled 2021-06-03 (×2): qty 2

## 2021-06-03 MED ORDER — STERILE WATER FOR IRRIGATION IR SOLN
Status: DC | PRN
  Administered 2021-06-03: 2000 mL

## 2021-06-03 MED ORDER — SODIUM CHLORIDE 0.9 % IV SOLN
2.0000 g | Freq: Once | INTRAVENOUS | Status: AC
  Administered 2021-06-03: 2 g via INTRAVENOUS

## 2021-06-03 MED ORDER — ONDANSETRON HCL 4 MG/2ML IJ SOLN
INTRAMUSCULAR | Status: AC
  Filled 2021-06-03: qty 2

## 2021-06-03 MED ORDER — METHOCARBAMOL 500 MG IVPB - SIMPLE MED
500.0000 mg | Freq: Four times a day (QID) | INTRAVENOUS | Status: DC | PRN
  Filled 2021-06-03: qty 50

## 2021-06-03 MED ORDER — ACETAMINOPHEN 325 MG PO TABS
325.0000 mg | ORAL_TABLET | Freq: Four times a day (QID) | ORAL | Status: DC | PRN
  Administered 2021-06-03: 650 mg via ORAL
  Filled 2021-06-03: qty 2

## 2021-06-03 MED ORDER — SODIUM CHLORIDE 0.9 % IV SOLN: INTRAVENOUS | Status: DC

## 2021-06-03 MED ORDER — PHENOL 1.4 % MT LIQD: 1.0000 | OROMUCOSAL | Status: DC | PRN

## 2021-06-03 MED ORDER — METOCLOPRAMIDE HCL 5 MG PO TABS: 5.0000 mg | ORAL_TABLET | Freq: Three times a day (TID) | ORAL | Status: DC | PRN

## 2021-06-03 MED ORDER — ORAL CARE MOUTH RINSE: 15.0000 mL | Freq: Once | OROMUCOSAL | Status: AC

## 2021-06-03 MED ORDER — SODIUM CHLORIDE (PF) 0.9 % IJ SOLN
INTRAMUSCULAR | Status: AC
  Filled 2021-06-03: qty 30

## 2021-06-03 MED ORDER — HYDROMORPHONE HCL 1 MG/ML IJ SOLN: 0.5000 mg | INTRAMUSCULAR | Status: DC | PRN

## 2021-06-03 MED ORDER — POLYETHYLENE GLYCOL 3350 17 G PO PACK: 17.0000 g | PACK | Freq: Every day | ORAL | Status: DC | PRN

## 2021-06-03 MED ORDER — PROPOFOL 10 MG/ML IV BOLUS
INTRAVENOUS | Status: DC | PRN
  Administered 2021-06-03: 20 mg via INTRAVENOUS

## 2021-06-03 MED ORDER — SODIUM CHLORIDE (PF) 0.9 % IJ SOLN
INTRAMUSCULAR | Status: DC | PRN
Start: 2021-06-03 — End: 2021-06-03
  Administered 2021-06-03: 30 mL

## 2021-06-03 MED ORDER — CHLORHEXIDINE GLUCONATE 0.12 % MT SOLN
15.0000 mL | Freq: Once | OROMUCOSAL | Status: AC
  Administered 2021-06-03: 15 mL via OROMUCOSAL

## 2021-06-03 MED ORDER — RALOXIFENE HCL 60 MG PO TABS
60.0000 mg | ORAL_TABLET | Freq: Every day | ORAL | Status: DC
  Administered 2021-06-04: 60 mg via ORAL
  Filled 2021-06-03: qty 1

## 2021-06-03 MED ORDER — DIPHENHYDRAMINE HCL 12.5 MG/5ML PO ELIX: 12.5000 mg | ORAL_SOLUTION | ORAL | Status: DC | PRN

## 2021-06-03 MED ORDER — ONDANSETRON HCL 4 MG/2ML IJ SOLN
INTRAMUSCULAR | Status: DC | PRN
  Administered 2021-06-03: 4 mg via INTRAVENOUS

## 2021-06-03 MED ORDER — DEXAMETHASONE SODIUM PHOSPHATE 10 MG/ML IJ SOLN
INTRAMUSCULAR | Status: AC
  Filled 2021-06-03: qty 1

## 2021-06-03 MED ORDER — DOCUSATE SODIUM 100 MG PO CAPS
100.0000 mg | ORAL_CAPSULE | Freq: Two times a day (BID) | ORAL | Status: DC
  Administered 2021-06-03 – 2021-06-04 (×2): 100 mg via ORAL
  Filled 2021-06-03 (×2): qty 1

## 2021-06-03 MED ORDER — PROPOFOL 1000 MG/100ML IV EMUL
INTRAVENOUS | Status: AC
  Filled 2021-06-03: qty 100

## 2021-06-03 MED ORDER — KETOROLAC TROMETHAMINE 30 MG/ML IJ SOLN
INTRAMUSCULAR | Status: AC
  Filled 2021-06-03: qty 1

## 2021-06-03 MED ORDER — TRANEXAMIC ACID-NACL 1000-0.7 MG/100ML-% IV SOLN
1000.0000 mg | INTRAVENOUS | Status: AC
  Administered 2021-06-03: 1000 mg via INTRAVENOUS

## 2021-06-03 MED ORDER — PHENYLEPHRINE HCL (PRESSORS) 10 MG/ML IV SOLN
INTRAVENOUS | Status: AC
  Filled 2021-06-03: qty 1

## 2021-06-03 MED ORDER — DEXMEDETOMIDINE (PRECEDEX) IN NS 20 MCG/5ML (4 MCG/ML) IV SYRINGE
PREFILLED_SYRINGE | INTRAVENOUS | Status: DC | PRN
  Administered 2021-06-03: 8 ug via INTRAVENOUS

## 2021-06-03 MED ORDER — ASPIRIN 81 MG PO CHEW
81.0000 mg | CHEWABLE_TABLET | Freq: Two times a day (BID) | ORAL | Status: DC
  Administered 2021-06-03 – 2021-06-04 (×2): 81 mg via ORAL
  Filled 2021-06-03 (×2): qty 1

## 2021-06-03 MED ORDER — METHOCARBAMOL 500 MG PO TABS: 500.0000 mg | ORAL_TABLET | Freq: Four times a day (QID) | ORAL | Status: DC | PRN

## 2021-06-03 MED ORDER — FENTANYL CITRATE (PF) 100 MCG/2ML IJ SOLN
50.0000 ug | INTRAMUSCULAR | Status: AC
  Administered 2021-06-03: 50 ug via INTRAVENOUS
  Filled 2021-06-03: qty 2

## 2021-06-03 MED ORDER — BISACODYL 10 MG RE SUPP: 10.0000 mg | Freq: Every day | RECTAL | Status: DC | PRN

## 2021-06-03 MED ORDER — KETOROLAC TROMETHAMINE 30 MG/ML IJ SOLN
INTRAMUSCULAR | Status: DC | PRN
  Administered 2021-06-03: 30 mg

## 2021-06-03 SURGICAL SUPPLY — 52 items
ATTUNE MED ANAT PAT 38 KNEE (Knees) ×2 IMPLANT
ATTUNE PS FEM LT SZ 4 CEM KNEE (Femur) ×2 IMPLANT
ATTUNE PSRP INSR SZ4 6 KNEE (Insert) ×2 IMPLANT
BAG COUNTER SPONGE SURGICOUNT (BAG) IMPLANT
BAG ZIPLOCK 12X15 (MISCELLANEOUS) IMPLANT
BASEPLATE TIBIAL ROTATING SZ 4 (Knees) ×2 IMPLANT
BLADE SAW SGTL 11.0X1.19X90.0M (BLADE) IMPLANT
BLADE SAW SGTL 13.0X1.19X90.0M (BLADE) ×2 IMPLANT
BLADE SURG SZ10 CARB STEEL (BLADE) ×4 IMPLANT
BNDG ELASTIC 6X5.8 VLCR STR LF (GAUZE/BANDAGES/DRESSINGS) ×2 IMPLANT
BOWL SMART MIX CTS (DISPOSABLE) ×2 IMPLANT
CEMENT HV SMART SET (Cement) ×2 IMPLANT
CUFF TOURN SGL QUICK 34 (TOURNIQUET CUFF) ×2
CUFF TRNQT CYL 34X4.125X (TOURNIQUET CUFF) ×1 IMPLANT
DECANTER SPIKE VIAL GLASS SM (MISCELLANEOUS) ×4 IMPLANT
DERMABOND ADVANCED (GAUZE/BANDAGES/DRESSINGS) ×1
DERMABOND ADVANCED .7 DNX12 (GAUZE/BANDAGES/DRESSINGS) ×1 IMPLANT
DRAPE U-SHAPE 47X51 STRL (DRAPES) ×2 IMPLANT
DRESSING AQUACEL AG SP 3.5X10 (GAUZE/BANDAGES/DRESSINGS) ×1 IMPLANT
DRSG AQUACEL AG ADV 3.5X10 (GAUZE/BANDAGES/DRESSINGS) ×2 IMPLANT
DRSG AQUACEL AG SP 3.5X10 (GAUZE/BANDAGES/DRESSINGS) ×2
DURAPREP 26ML APPLICATOR (WOUND CARE) ×4 IMPLANT
ELECT REM PT RETURN 15FT ADLT (MISCELLANEOUS) ×2 IMPLANT
GLOVE SURG ENC MOIS LTX SZ6 (GLOVE) IMPLANT
GLOVE SURG UNDER LTX SZ7.5 (GLOVE) ×2 IMPLANT
GLOVE SURG UNDER POLY LF SZ6.5 (GLOVE) IMPLANT
GLOVE SURG UNDER POLY LF SZ7.5 (GLOVE) ×2 IMPLANT
GOWN STRL REUS W/TWL LRG LVL3 (GOWN DISPOSABLE) ×2 IMPLANT
HANDPIECE INTERPULSE COAX TIP (DISPOSABLE) ×2
HOLDER FOLEY CATH W/STRAP (MISCELLANEOUS) IMPLANT
KIT TURNOVER KIT A (KITS) ×2 IMPLANT
MANIFOLD NEPTUNE II (INSTRUMENTS) ×2 IMPLANT
NDL SAFETY ECLIPSE 18X1.5 (NEEDLE) IMPLANT
NEEDLE HYPO 18GX1.5 SHARP (NEEDLE)
NS IRRIG 1000ML POUR BTL (IV SOLUTION) ×2 IMPLANT
PACK TOTAL KNEE CUSTOM (KITS) ×2 IMPLANT
PENCIL SMOKE EVACUATOR (MISCELLANEOUS) IMPLANT
PIN DRILL FIX HALF THREAD (BIT) ×2 IMPLANT
PIN FIX SIGMA LCS THRD HI (PIN) ×2 IMPLANT
PROTECTOR NERVE ULNAR (MISCELLANEOUS) ×2 IMPLANT
SET HNDPC FAN SPRY TIP SCT (DISPOSABLE) ×1 IMPLANT
SET PAD KNEE POSITIONER (MISCELLANEOUS) ×2 IMPLANT
SUT MNCRL AB 4-0 PS2 18 (SUTURE) ×2 IMPLANT
SUT STRATAFIX PDS+ 0 24IN (SUTURE) ×2 IMPLANT
SUT VIC AB 1 CT1 36 (SUTURE) ×2 IMPLANT
SUT VIC AB 2-0 CT1 27 (SUTURE) ×6
SUT VIC AB 2-0 CT1 TAPERPNT 27 (SUTURE) ×3 IMPLANT
SYR 3ML LL SCALE MARK (SYRINGE) ×2 IMPLANT
TRAY FOLEY MTR SLVR 16FR STAT (SET/KITS/TRAYS/PACK) ×2 IMPLANT
TUBE SUCTION HIGH CAP CLEAR NV (SUCTIONS) ×2 IMPLANT
WATER STERILE IRR 1000ML POUR (IV SOLUTION) ×4 IMPLANT
WRAP KNEE MAXI GEL POST OP (GAUZE/BANDAGES/DRESSINGS) ×2 IMPLANT

## 2021-06-03 NOTE — Evaluation (Signed)
Physical Therapy Evaluation Patient Details Name: Natalie Fowler MRN: [id number] DOB: 06/17/1946 Today's Date: 06/03/2021   History of Present Illness  s/p L TKA. PMH: diet controlled DM, vertigo, RCR  Clinical Impression  Pt is s/p TKA resulting in the deficits listed below (see PT Problem List).  Pt doing very well today, amb ~ 72' RW with and min assist. Pt has flight of stairs up to bedroom however plans to stay downstairs for a wk or so. May want to practice a few stairs prior to d/c home. Family very supportive. Continue PT in acute setting.  Pt will benefit from skilled PT to increase their independence and safety with mobility to allow discharge to the venue listed below.      Follow Up Recommendations Follow surgeon's recommendation for DC plan and follow-up therapies    Equipment Recommendations  Rolling walker with 5" wheels;3in1 (PT)    Recommendations for Other Services       Precautions / Restrictions Precautions Precautions: Fall;Knee Restrictions Weight Bearing Restrictions: No      Mobility  Bed Mobility Overal bed mobility: Needs Assistance Bed Mobility: Supine to Sit     Supine to sit: Min assist;Min guard     General bed mobility comments: assist with LLE    Transfers Overall transfer level: Needs assistance Equipment used: Rolling walker (2 wheeled) Transfers: Sit to/from Stand Sit to Stand: Min assist;Min guard         General transfer comment: cues for hand placement and LLE position  Ambulation/Gait Ambulation/Gait assistance: Min assist;Min guard Gait Distance (Feet): 45 Feet Assistive device: Rolling walker (2 wheeled) Gait Pattern/deviations: Step-to pattern     General Gait Details: cues for sequence and RW position  Stairs            Wheelchair Mobility    Modified Rankin (Stroke Patients Only)       Balance                                             Pertinent Vitals/Pain Pain Assessment:  0-10 Pain Location: left knee Pain Descriptors / Indicators: Aching;Sore Pain Intervention(s): Limited activity within patient's tolerance;Monitored during session;Premedicated before session;Repositioned    Home Living Family/patient expects to be discharged to:: Private residence Living Arrangements: Spouse/significant other Available Help at Discharge: Family Type of Home: House Home Access: Stairs to enter   Technical brewer of Steps: 1 Home Layout: Two level;Bed/bath upstairs Home Equipment: None      Prior Function Level of Independence: Independent               Hand Dominance        Extremity/Trunk Assessment   Upper Extremity Assessment Upper Extremity Assessment: Overall WFL for tasks assessed    Lower Extremity Assessment Lower Extremity Assessment: LLE deficits/detail LLE Deficits / Details: ankle WFL, knee extension and hip flexion 2+/5       Communication   Communication: No difficulties  Cognition Arousal/Alertness: Awake/alert Behavior During Therapy: WFL for tasks assessed/performed Overall Cognitive Status: Within Functional Limits for tasks assessed                                        General Comments      Exercises Total Joint Exercises Ankle Circles/Pumps: AROM;Both;10 reps Quad Sets:  AROM;Both;10 reps Heel Slides: AAROM;Left;5 reps Straight Leg Raises: AAROM;Left;5 reps   Assessment/Plan    PT Assessment    PT Problem List         PT Treatment Interventions      PT Goals (Current goals can be found in the Care Plan section)  Acute Rehab PT Goals Patient Stated Goal: home, be able to get upstairs PT Goal Formulation: With patient Time For Goal Achievement: 06/10/21 Potential to Achieve Goals: Good    Frequency     Barriers to discharge        Co-evaluation               AM-PAC PT "6 Clicks" Mobility  Outcome Measure Help needed turning from your back to your side while in a flat  bed without using bedrails?: A Little Help needed moving from lying on your back to sitting on the side of a flat bed without using bedrails?: A Little Help needed moving to and from a bed to a chair (including a wheelchair)?: A Little Help needed standing up from a chair using your arms (e.g., wheelchair or bedside chair)?: A Little Help needed to walk in hospital room?: A Little Help needed climbing 3-5 steps with a railing? : A Little 6 Click Score: 18    End of Session Equipment Utilized During Treatment: Gait belt Activity Tolerance: Patient tolerated treatment well Patient left: with call bell/phone within reach;in chair;with chair alarm set;with family/visitor present   PT Visit Diagnosis: Other abnormalities of gait and mobility (R26.89);Difficulty in walking, not elsewhere classified (R26.2)    Time: 1624-4695 PT Time Calculation (min) (ACUTE ONLY): 24 min   Charges:   PT Evaluation $PT Eval Low Complexity: 1 Low PT Treatments $Gait Training: 8-22 mins        Baxter Flattery, PT  Acute Rehab Dept ([place]) [phone] Pager [phone]  06/03/2021   [facility] 06/03/2021, 3:55 PM

## 2021-06-03 NOTE — Transfer of Care (Signed)
Immediate Anesthesia Transfer of Care Note  Patient: Natalie Fowler  Procedure(s) Performed: TOTAL KNEE ARTHROPLASTY (Left: Knee)  Patient Location: PACU  Anesthesia Type:Spinal  Level of Consciousness: awake, alert , oriented and patient cooperative  Airway & Oxygen Therapy: Patient Spontanous Breathing and Patient connected to face mask  Post-op Assessment: Report given to RN and Post -op Vital signs reviewed and stable  Post vital signs: Reviewed and stable  Last Vitals:  Vitals Value Taken Time  BP 112/58 06/03/21 1133  Temp    Pulse 89 06/03/21 1134  Resp 20 06/03/21 1134  SpO2 100 % 06/03/21 1134  Vitals shown include unvalidated device data.  Last Pain:  Vitals:   06/03/21 0915  TempSrc:   PainSc: 4       Patients Stated Pain Goal: 4 (29/51/88 4166)  Complications: No notable events documented.

## 2021-06-03 NOTE — Anesthesia Procedure Notes (Signed)
Procedure Name: MAC Date/Time: 06/03/2021 10:33 AM Performed by: Claudia Desanctis, CRNA Pre-anesthesia Checklist: Patient identified, Emergency Drugs available, Suction available and Patient being monitored Patient Re-evaluated:Patient Re-evaluated prior to induction Oxygen Delivery Method: Simple face mask

## 2021-06-03 NOTE — Anesthesia Procedure Notes (Signed)
Anesthesia Regional Block: Adductor canal block   Pre-Anesthetic Checklist: , timeout performed,  Correct Patient, Correct Site, Correct Laterality,  Correct Procedure, Correct Position, site marked,  Risks and benefits discussed,  Surgical consent,  Pre-op evaluation,  At surgeon's request and post-op pain management  Laterality: Left  Prep: chloraprep       Needles:  Injection technique: Single-shot  Needle Type: Echogenic Needle     Needle Length: 10cm  Needle Gauge: 21     Additional Needles:   Narrative:  Start time: 06/03/2021 9:12 AM End time: 06/03/2021 9:15 AM Injection made incrementally with aspirations every 5 mL.  Performed by: Personally  Anesthesiologist: Audry Pili, MD  Additional Notes: No pain on injection. No increased resistance to injection. Injection made in 5cc increments. Good needle visualization. Patient tolerated the procedure well.

## 2021-06-03 NOTE — Anesthesia Postprocedure Evaluation (Signed)
Anesthesia Post Note  Patient: Natalie Fowler  Procedure(s) Performed: TOTAL KNEE ARTHROPLASTY (Left: Knee)     Patient location during evaluation: PACU Anesthesia Type: Spinal Level of consciousness: awake and alert Pain management: pain level controlled Vital Signs Assessment: post-procedure vital signs reviewed and stable Respiratory status: spontaneous breathing and respiratory function stable Cardiovascular status: blood pressure returned to baseline and stable Postop Assessment: spinal receding and no apparent nausea or vomiting Anesthetic complications: no   No notable events documented.  Last Vitals:  Vitals:   06/03/21 1215 06/03/21 1239  BP: 128/71 138/70  Pulse: 92 89  Resp: 19 18  Temp: 36.4 C (!) 36.3 C  SpO2: 96% 99%                  Audry Pili

## 2021-06-03 NOTE — Anesthesia Preprocedure Evaluation (Addendum)
Anesthesia Evaluation  Patient identified by MRN, date of birth, ID band Patient awake    Reviewed: Allergy & Precautions, NPO status , Patient's Chart, lab work & pertinent test results  History of Anesthesia Complications (+) PONV and history of anesthetic complications  Airway Mallampati: II  TM Distance: >3 FB Neck ROM: Full    Dental  (+) Dental Advisory Given, Teeth Intact   Pulmonary neg pulmonary ROS,    Pulmonary exam normal        Cardiovascular negative cardio ROS Normal cardiovascular exam     Neuro/Psych  Vertigo  negative psych ROS   GI/Hepatic Neg liver ROS, GERD  ,  Endo/Other  diabetes, Type 2 Obesity   Renal/GU negative Renal ROS     Musculoskeletal  (+) Arthritis ,   Abdominal   Peds  Hematology negative hematology ROS (+)   Anesthesia Other Findings Covid test negative   Reproductive/Obstetrics                            Anesthesia Physical Anesthesia Plan  ASA: 2  Anesthesia Plan: Spinal   Post-op Pain Management:  Regional for Post-op pain   Induction:   PONV Risk Score and Plan: 2 and Treatment may vary due to age or medical condition and Propofol infusion  Airway Management Planned: Natural Airway and Simple Face Mask  Additional Equipment: None  Intra-op Plan:   Post-operative Plan:   Informed Consent: I have reviewed the patients History and Physical, chart, labs and discussed the procedure including the risks, benefits and alternatives for the proposed anesthesia with the patient or authorized representative who has indicated his/her understanding and acceptance.       Plan Discussed with: CRNA and Anesthesiologist  Anesthesia Plan Comments: (Labs reviewed, platelets acceptable. Discussed risks and benefits of spinal, including spinal/epidural hematoma, infection, failed block, and PDPH. Patient expressed understanding and wished to  proceed. )       Anesthesia Quick Evaluation

## 2021-06-03 NOTE — Progress Notes (Signed)
AssistedDr. Brock with left, ultrasound guided, adductor canal block. Side rails up, monitors on throughout procedure. See vital signs in flow sheet. Tolerated Procedure well.  

## 2021-06-03 NOTE — Op Note (Signed)
NAME:  Natalie Fowler                      MEDICAL RECORD NO.:  [id number]                             FACILITY:  [facility]      PHYSICIAN:  Pietro Cassis. Alvan Dame, M.D.  DATE OF BIRTH:  01/31/1946      DATE OF PROCEDURE:  06/03/2021                                     OPERATIVE REPORT         PREOPERATIVE DIAGNOSIS:  Left knee osteoarthritis.      POSTOPERATIVE DIAGNOSIS:  Left knee osteoarthritis.      FINDINGS:  The patient was noted to have complete loss of cartilage and   bone-on-bone arthritis with associated osteophytes in the medial and patellemoral compartments of   the knee with a significant synovitis and associated effusion.  The patient had failed months of conservative treatment including medications, injection therapy, activity modification.     PROCEDURE:  Left total knee replacement.      COMPONENTS USED:  DePuy Attune rotating platform posterior stabilized knee   system, a size 4 femur, 4 tibia, size 6 mm PS AOX insert, and 38 anatomic patellar   button.      SURGEON:  Pietro Cassis. Alvan Dame, M.D.      ASSISTANT:  Costella Hatcher, PA-C.      ANESTHESIA:  Regional and Spinal.      SPECIMENS:  None.      COMPLICATION:  None.      DRAINS:  None.  EBL: <100cc      TOURNIQUET TIME:  28 min 250 mmH     The patient was stable to the recovery room.      INDICATION FOR PROCEDURE:  Natalie Fowler is a 75 y.o. female patient of   mine.  The patient had been seen, evaluated, and treated for months conservatively in the   office with medication, activity modification, and injections.  The patient had   radiographic changes of bone-on-bone arthritis with endplate sclerosis and osteophytes noted.  Based on the radiographic changes and failed conservative measures, the patient   decided to proceed with definitive treatment, total knee replacement.  Risks of infection, DVT, component failure, need for revision surgery, neurovascular injury were reviewed in the office setting.  The postop  course was reviewed stressing the efforts to maximize post-operative satisfaction and function.  Consent was obtained for benefit of pain   relief.      PROCEDURE IN DETAIL:  The patient was brought to the operative theater.   Once adequate anesthesia, preoperative antibiotics, 2 gm of Ancef,1 gm of Tranexamic Acid, and 10 mg of Decadron administered, the patient was positioned supine with a left thigh tourniquet placed.  The  left lower extremity was prepped and draped in sterile fashion.  A time-   out was performed identifying the patient, planned procedure, and the appropriate extremity.      The left lower extremity was placed in the [facility] leg holder.  The leg was   exsanguinated, tourniquet elevated to 250 mmHg.  A midline incision was   made followed by median parapatellar arthrotomy.  Following initial   exposure, attention was first directed to the  patella.  Precut   measurement was noted to be 23 mm.  I resected down to 14 mm and used a   38 anatomic patellar button to restore patellar height as well as cover the cut surface.      The lug holes were drilled and a metal shim was placed to protect the   patella from retractors and saw blade during the procedure.      At this point, attention was now directed to the femur.  The femoral   canal was opened with a drill, irrigated to try to prevent fat emboli.  An   intramedullary rod was passed at 3 degrees valgus, 9 mm of bone was   resected off the distal femur.  Following this resection, the tibia was   subluxated anteriorly.  Using the extramedullary guide, 2 mm of bone was resected off   the proximal medial tibia.  We confirmed the gap would be   stable medially and laterally with a size 5 spacer block as well as confirmed that the tibial cut was perpendicular in the coronal plane, checking with an alignment rod.      Once this was done, I sized the femur to be a size 4 in the anterior-   posterior dimension, chose a standard  component based on medial and   lateral dimension.  The size 4 rotation block was then pinned in   position anterior referenced using the C-clamp to set rotation.  The   anterior, posterior, and  chamfer cuts were made without difficulty nor   notching making certain that I was along the anterior cortex to help   with flexion gap stability.      The final box cut was made off the lateral aspect of distal femur.      At this point, the tibia was sized to be a size 4.  The size 4 tray was   then pinned in position through the medial third of the tubercle,   drilled, and keel punched.  Trial reduction was now carried with a 4 femur,  4 tibia, a size 6 mm PS insert, and the 38 anatomic patella botton.  The knee was brought to full extension with good flexion stability with the patella   tracking through the trochlea without application of pressure.  Given   all these findings the trial components removed.  Final components were   opened and cement was mixed.  The knee was irrigated with normal saline solution and pulse lavage.  The synovial lining was   then injected with 30 cc of 0.25% Marcaine with epinephrine, 1 cc of Toradol and 30 cc of NS for a total of 61 cc.     Final implants were then cemented onto cleaned and dried cut surfaces of bone with the knee brought to extension with a size 6 mm PS trial insert.      Once the cement had fully cured, excess cement was removed   throughout the knee.  I confirmed that I was satisfied with the range of   motion and stability, and the final size 6 mm PS AOX insert was chosen.  It was   placed into the knee.      The tourniquet had been let down at 28 minutes.  No significant   hemostasis was required.  The extensor mechanism was then reapproximated using #1 Vicryl and #1 Stratafix sutures with the knee   in flexion.  The   remaining wound was  closed with 2-0 Vicryl and running 4-0 Monocryl.   The knee was cleaned, dried, dressed sterilely using  Dermabond and   Aquacel dressing.  The patient was then   brought to recovery room in stable condition, tolerating the procedure   well.   Please note that Physician Assistant, Costella Hatcher, PA-C was present for the entirety of the case, and was utilized for pre-operative positioning, peri-operative retractor management, general facilitation of the procedure and for primary wound closure at the end of the case.              Pietro Cassis Alvan Dame, M.D.    06/03/2021 9:51 AM

## 2021-06-03 NOTE — Anesthesia Procedure Notes (Signed)
Spinal  Patient location during procedure: OR Start time: 06/03/2021 9:49 AM End time: 06/03/2021 9:52 AM Reason for block: surgical anesthesia Staffing Performed: anesthesiologist  Anesthesiologist: Audry Pili, MD Preanesthetic Checklist Completed: patient identified, IV checked, risks and benefits discussed, surgical consent, monitors and equipment checked, pre-op evaluation and timeout performed Spinal Block Patient position: sitting Prep: DuraPrep Patient monitoring: heart rate, cardiac monitor, continuous pulse ox and blood pressure Approach: midline Location: L3-4 Injection technique: single-shot Needle Needle type: Pencan  Needle gauge: 24 G Additional Notes Consent was obtained prior to the procedure with all questions answered and concerns addressed. Risks including, but not limited to, bleeding, infection, nerve damage, paralysis, failed block, inadequate analgesia, allergic reaction, high spinal, itching, and headache were discussed and the patient wished to proceed. Functioning IV was confirmed and monitors were applied. Sterile prep and drape, including hand hygiene, mask, and sterile gloves were used. The patient was positioned and the spine was prepped. The skin was anesthetized with lidocaine. Free flow of clear CSF was obtained prior to injecting local anesthetic into the CSF. The spinal needle aspirated freely following injection. The needle was carefully withdrawn. The patient tolerated the procedure well.   Natalie Don, MD

## 2021-06-03 NOTE — Discharge Instructions (Signed)

## 2021-06-03 NOTE — Interval H&P Note (Signed)
History and Physical Interval Note:  06/03/2021 8:39 AM  Natalie Fowler  has presented today for surgery, with the diagnosis of Left knee osteoarthritis.  The various methods of treatment have been discussed with the patient and family. After consideration of risks, benefits and other options for treatment, the patient has consented to  Procedure(s): TOTAL KNEE ARTHROPLASTY (Left) as a surgical intervention.  The patient's history has been reviewed, patient examined, no change in status, stable for surgery.  I have reviewed the patient's chart and labs.  Questions were answered to the patient's satisfaction.     Mauri Pole

## 2021-06-03 NOTE — Plan of Care (Signed)
  Problem: Education: Goal: Knowledge of General Education information will improve Description: Including pain rating scale, medication(s)/side effects and non-pharmacologic comfort measures Outcome: Progressing   Problem: Activity: Goal: Risk for activity intolerance will decrease Outcome: Progressing   Problem: Elimination: Goal: Will not experience complications related to bowel motility Outcome: Progressing   Problem: Pain Managment: Goal: General experience of comfort will improve Outcome: Progressing   Problem: Safety: Goal: Ability to remain free from injury will improve Outcome: Progressing   Problem: Education: Goal: Knowledge of the prescribed therapeutic regimen will improve Outcome: Progressing   Problem: Activity: Goal: Ability to avoid complications of mobility impairment will improve Outcome: Progressing Goal: Range of joint motion will improve Outcome: Progressing

## 2021-06-04 ENCOUNTER — Encounter (HOSPITAL_COMMUNITY): Payer: Self-pay | Admitting: Orthopedic Surgery

## 2021-06-04 DIAGNOSIS — Z96652 Presence of left artificial knee joint: Secondary | ICD-10-CM | POA: Diagnosis not present

## 2021-06-04 DIAGNOSIS — M1712 Unilateral primary osteoarthritis, left knee: Secondary | ICD-10-CM | POA: Diagnosis not present

## 2021-06-04 LAB — BASIC METABOLIC PANEL
Anion gap: 9 (ref 5–15)
BUN: 19 mg/dL (ref 8–23)
CO2: 23 mmol/L (ref 22–32)
Calcium: 8.2 mg/dL — ABNORMAL LOW (ref 8.9–10.3)
Chloride: 105 mmol/L (ref 98–111)
Creatinine, Ser: 1.04 mg/dL — ABNORMAL HIGH (ref 0.44–1.00)
GFR, Estimated: 57 mL/min — ABNORMAL LOW (ref >60)
Glucose, Bld: 312 mg/dL — ABNORMAL HIGH (ref 70–99)
Potassium: 4.2 mmol/L (ref 3.5–5.1)
Sodium: 137 mmol/L (ref 135–145)

## 2021-06-04 LAB — CBC
HCT: 35.4 % — ABNORMAL LOW (ref 36.0–46.0)
Hemoglobin: 11.7 g/dL — ABNORMAL LOW (ref 12.0–15.0)
MCH: 29.8 pg (ref 26.0–34.0)
MCHC: 33.1 g/dL (ref 30.0–36.0)
MCV: 90.1 fL (ref 80.0–100.0)
Platelets: 163 10*3/uL (ref 150–400)
RBC: 3.93 MIL/uL (ref 3.87–5.11)
RDW: 12.8 % (ref 11.5–15.5)
WBC: 14.9 10*3/uL — ABNORMAL HIGH (ref 4.0–10.5)
nRBC: 0 % (ref 0.0–0.2)

## 2021-06-04 MED ORDER — OXYCODONE HCL 5 MG PO TABS: 5.0000 mg | ORAL_TABLET | Freq: Four times a day (QID) | ORAL | 0 refills | Status: DC | PRN

## 2021-06-04 MED ORDER — METHOCARBAMOL 500 MG PO TABS: 500.0000 mg | ORAL_TABLET | Freq: Four times a day (QID) | ORAL | 0 refills | Status: DC | PRN

## 2021-06-04 MED ORDER — ASPIRIN 81 MG PO CHEW: 81.0000 mg | CHEWABLE_TABLET | Freq: Two times a day (BID) | ORAL | 0 refills | Status: AC

## 2021-06-04 MED ORDER — DOCUSATE SODIUM 100 MG PO CAPS: 100.0000 mg | ORAL_CAPSULE | Freq: Two times a day (BID) | ORAL | 0 refills | Status: DC

## 2021-06-04 MED ORDER — POLYETHYLENE GLYCOL 3350 17 G PO PACK: 17.0000 g | PACK | Freq: Every day | ORAL | 0 refills | Status: DC | PRN

## 2021-06-04 NOTE — Care Plan (Signed)
Ortho Bundle Case Management Note  Patient Details  Name: Natalie Fowler MRN: [id number] Date of Birth: 1946-10-23  L TKA on 06-03-21 DCP:  Home with husband and dtr.  2 story home with 3 ste DME:  RW and 3-in-1 ordered through [place] PT:  [facility] PT.  Eval scheduled on 06-06-21.                   DME Arranged:  Walker rolling, 3-N-1 DME Agency:  [employer]  HH Arranged:  NA HH Agency:  NA  Additional Comments: Please contact me with any questions of if this plan should need to change.  Marianne Sofia, RN,CCM [facility]  [phone] 06/04/2021, 9:34 AM

## 2021-06-04 NOTE — Progress Notes (Signed)
Physical Therapy Treatment Patient Details Name: Natalie Fowler MRN: [id number] DOB: 1946/06/04 Today's Date: 06/04/2021    History of Present Illness s/p L TKA. PMH: diet controlled DM, vertigo, RCR    PT Comments    Progressing with mobility. Increased pain on today per pt. Pt would like to go home today. Will have 1 more session to practice stair negotiation (1 step-pt is planning to stay on 1st level for a while).    Follow Up Recommendations  Follow surgeon's recommendation for DC plan and follow-up therapies     Equipment Recommendations  Rolling walker with 5" wheels;3in1 (PT)    Recommendations for Other Services       Precautions / Restrictions Precautions Precautions: Fall;Knee Restrictions Weight Bearing Restrictions: No Other Position/Activity Restrictions: WBAT    Mobility  Bed Mobility Overal bed mobility: Needs Assistance Bed Mobility: Supine to Sit     Supine to sit: Supervision     General bed mobility comments: Supv for safety. Increased time.    Transfers Overall transfer level: Needs assistance Equipment used: Rolling walker (2 wheeled) Transfers: Sit to/from Stand Sit to Stand: Min guard         General transfer comment: Min guard for safety. Cues for safety, hand placement  Ambulation/Gait Ambulation/Gait assistance: Min assist Gait Distance (Feet): 60 Feet Assistive device: Rolling walker (2 wheeled) Gait Pattern/deviations: Step-to pattern;Antalgic     General Gait Details: Assist to stabilize intermittently. Distance limited by pain.   Stairs             Wheelchair Mobility    Modified Rankin (Stroke Patients Only)       Balance Overall balance assessment: Needs assistance         Standing balance support: Bilateral upper extremity supported Standing balance-Leahy Scale: Fair                              Cognition Arousal/Alertness: Awake/alert Behavior During Therapy: WFL for tasks  assessed/performed Overall Cognitive Status: Within Functional Limits for tasks assessed                                        Exercises Total Joint Exercises Ankle Circles/Pumps: AROM;Both;10 reps Quad Sets: AROM;Both;10 reps Hip ABduction/ADduction: AAROM;Left;10 reps;AROM Straight Leg Raises: AROM;AAROM;Left;10 reps Knee Flexion: AROM;Left;10 reps;Seated Goniometric ROM: ~10-75 degrees    General Comments        Pertinent Vitals/Pain Pain Assessment: 0-10 Pain Score: 8  Pain Location: left knee Pain Descriptors / Indicators: Discomfort;Grimacing;Sore Pain Intervention(s): Limited activity within patient's tolerance;Monitored during session;Repositioned    Home Living                      Prior Function            PT Goals (current goals can now be found in the care plan section) Progress towards PT goals: Progressing toward goals    Frequency    7X/week      PT Plan Current plan remains appropriate    Co-evaluation              AM-PAC PT "6 Clicks" Mobility   Outcome Measure  Help needed turning from your back to your side while in a flat bed without using bedrails?: A Little Help needed moving from lying on your back to sitting on the side  of a flat bed without using bedrails?: A Little Help needed moving to and from a bed to a chair (including a wheelchair)?: A Little Help needed standing up from a chair using your arms (e.g., wheelchair or bedside chair)?: A Little Help needed to walk in hospital room?: A Little Help needed climbing 3-5 steps with a railing? : A Little 6 Click Score: 18    End of Session Equipment Utilized During Treatment: Gait belt Activity Tolerance: Patient tolerated treatment well Patient left: in chair;with call bell/phone within reach;with family/visitor present   PT Visit Diagnosis: Other abnormalities of gait and mobility (R26.89);Difficulty in walking, not elsewhere classified (R26.2)      Time: 2440-1027 PT Time Calculation (min) (ACUTE ONLY): 37 min  Charges:  $Gait Training: 8-22 mins $Therapeutic Exercise: 8-22 mins                         Doreatha Massed, PT Acute Rehabilitation  Office: [phone] Pager: [phone]

## 2021-06-04 NOTE — Progress Notes (Signed)
Physical Therapy Treatment Patient Details Name: Natalie Fowler MRN: [id number] DOB: [birth year] Today's Date: 06/04/2021    History of Present Illness s/p L TKA. PMH: diet controlled DM, vertigo, RCR    PT Comments    Progressing well. Pain remains elevated but pt participated well. Reviewed/practiced gait and stair training. Instructed pt to walk often and to perform SLR and knee flexion ROM exercises 2x/day until OP PT begins. Okay to d/c from PT standpoint.     Follow Up Recommendations  Follow surgeon's recommendation for DC plan and follow-up therapies     Equipment Recommendations  Rolling walker with 5" wheels;3in1 (PT)    Recommendations for Other Services       Precautions / Restrictions Precautions Precautions: Fall;Knee Restrictions Weight Bearing Restrictions: No Other Position/Activity Restrictions: WBAT    Mobility  Bed Mobility Overal bed mobility: Needs Assistance Bed Mobility: Supine to Sit     Supine to sit: Supervision     General bed mobility comments: oob in recliner    Transfers Overall transfer level: Needs assistance Equipment used: Rolling walker (2 wheeled) Transfers: Sit to/from Stand Sit to Stand: Min guard         General transfer comment: Min guard for safety. Cues for safety, hand placement  Ambulation/Gait Ambulation/Gait assistance: Min guard Gait Distance (Feet): 50 Feet Assistive device: Rolling walker (2 wheeled) Gait Pattern/deviations: Step-to pattern;Antalgic     General Gait Details: Min guard for safety. Cues for safety, RW proximity   Stairs Stairs: Yes Stairs assistance: Min guard Stair Management: Step to pattern;Forwards;With walker Number of Stairs: 1 General stair comments: Cues for safety, technique, sequence. Husband and daughter present to observe.   Wheelchair Mobility    Modified Rankin (Stroke Patients Only)       Balance Overall balance assessment: Needs assistance          Standing balance support: Bilateral upper extremity supported Standing balance-Leahy Scale: Fair                              Cognition Arousal/Alertness: Awake/alert Behavior During Therapy: WFL for tasks assessed/performed Overall Cognitive Status: Within Functional Limits for tasks assessed                                        Exercises     General Comments        Pertinent Vitals/Pain Pain Assessment: 0-10 Pain Score: 9  Pain Location: left knee Pain Descriptors / Indicators: Discomfort;Grimacing;Sore Pain Intervention(s): Limited activity within patient's tolerance;Monitored during session;Repositioned;Ice applied    Home Living                      Prior Function            PT Goals (current goals can now be found in the care plan section) Progress towards PT goals: Progressing toward goals    Frequency    7X/week      PT Plan Current plan remains appropriate    Co-evaluation              AM-PAC PT "6 Clicks" Mobility   Outcome Measure  Help needed turning from your back to your side while in a flat bed without using bedrails?: A Little Help needed moving from lying on your back to sitting on the side of a flat bed  without using bedrails?: A Little Help needed moving to and from a bed to a chair (including a wheelchair)?: A Little Help needed standing up from a chair using your arms (e.g., wheelchair or bedside chair)?: A Little Help needed to walk in hospital room?: A Little Help needed climbing 3-5 steps with a railing? : A Little 6 Click Score: 18    End of Session Equipment Utilized During Treatment: Gait belt Activity Tolerance: Patient tolerated treatment well Patient left: in chair;with call bell/phone within reach;with family/visitor present   PT Visit Diagnosis: Other abnormalities of gait and mobility (R26.89);Difficulty in walking, not elsewhere classified (R26.2)     Time: 1947-1252 PT  Time Calculation (min) (ACUTE ONLY): 23 min  Charges:  $Gait Training: 23-37 mins $Therapeutic Exercise: 8-22 mins                         Doreatha Massed, PT Acute Rehabilitation  Office: [phone] Pager: [phone]

## 2021-06-04 NOTE — Progress Notes (Signed)
   Subjective: 1 Day Post-Op Procedure(s) (LRB): TOTAL KNEE ARTHROPLASTY (Left) Patient reports pain as mild.   Patient seen in rounds for Dr. Alvan Dame. Patient is well, and has had no acute complaints or problems. No acute events overnight. Foley catheter removed. Patient ambulated 45 feet with PT.  We will continue therapy today.   Objective: Vital signs in last 24 hours: Temp:  [97.4 F (36.3 C)-98.2 F (36.8 C)] 98 F (36.7 C) (07/13 0539) Pulse Rate:  [86-92] 89 (07/13 0539) Resp:  [17-30] 17 (07/13 0539) BP: (112-162)/(58-98) 138/71 (07/13 0539) SpO2:  [96 %-99 %] 99 % (07/13 0539) Weight:  [77.3 kg] 77.3 kg (07/12 1239)  Intake/Output from previous day:  Intake/Output Summary (Last 24 hours) at 06/04/2021 0849 Last data filed at 06/04/2021 0550 Gross per 24 hour  Intake 1470 ml  Output 2400 ml  Net -930 ml     Intake/Output this shift: No intake/output data recorded.  Labs: Recent Labs    06/04/21 0322  HGB 11.7*   Recent Labs    06/04/21 0322  WBC 14.9*  RBC 3.93  HCT 35.4*  PLT 163   Recent Labs    06/04/21 0322  NA 137  K 4.2  CL 105  CO2 23  BUN 19  CREATININE 1.04*  GLUCOSE 312*  CALCIUM 8.2*   No results for input(s): LABPT, INR in the last 72 hours.  Exam: General - Patient is Alert and Oriented Extremity - Neurologically intact Sensation intact distally Intact pulses distally Dorsiflexion/Plantar flexion intact Dressing - dressing C/D/I Motor Function - intact, moving foot and toes well on exam.   Past Medical History:  Diagnosis Date   Acid reflux    Arthritis    Brenner tumor    benign   Osteopenia    Ovarian cyst, bilateral    Pneumonia    PONV (postoperative nausea and vomiting)    Pre-diabetes    Vertigo    Vitamin D deficiency     Assessment/Plan: 1 Day Post-Op Procedure(s) (LRB): TOTAL KNEE ARTHROPLASTY (Left) Active Problems:   S/P total knee arthroplasty, left  Estimated body mass index is 33.28 kg/m as  calculated from the following:   Height as of this encounter: 5' (1.524 m).   Weight as of this encounter: 77.3 kg. Advance diet Up with therapy D/C IV fluids   Patient's anticipated LOS is less than 2 midnights, meeting these requirements: - Younger than 5 - Lives within 1 hour of care - Has a competent adult at home to recover with post-op recover - NO history of  - Chronic pain requiring opiods  - Diabetes  - Coronary Artery Disease  - Heart failure  - Heart attack  - Stroke  - DVT/VTE  - Cardiac arrhythmia  - Respiratory Failure/COPD  - Renal failure  - Anemia  - Advanced Liver disease    DVT Prophylaxis - Aspirin Weight bearing as tolerated.  Plan is to go Home after hospital stay. Plan for discharge today following 1-2 sessions of PT as long as they are meeting their goals. Patient is scheduled for OPPT. Follow up in the office in 2 weeks.   Griffith Citron, PA-C Orthopedic Surgery [phone] 06/04/2021, 8:49 AM

## 2021-06-04 NOTE — TOC Transition Note (Signed)
Transition of Care [facility]) - CM/SW Discharge Note  Patient Details  Name: Natalie Fowler MRN: [id number] Date of Birth: 1946-02-10  [facility] CM/SW Contact:  Sherie Don, LCSW Phone Number: 06/04/2021, 10:11 AM  Clinical Narrative: Patient is expected to discharge home after working with PT. CSW met with patient and her husband to review discharge plan. Patient will discharge home with OPPT through [facility] PT. Patient will need a rolling walker and 3N1, which [employer] will deliver to patient's room. TOC signing off.  Final next level of care: OP Rehab Barriers to Discharge: No Barriers Identified  Patient Goals and CMS Choice Patient states their goals for this hospitalization and ongoing recovery are:: Discharge home with OPPT through [place] Medicare.gov Compare Post Acute Care list provided to:: Patient Choice offered to / list presented to : Patient  Discharge Plan and Services        DME Arranged: 3-N-1, Walker rolling DME Agency: [employer] Representative spoke with at DME Agency: Pre-arranged in orthopedist's office HH Arranged: NA [place] Agency: NA  Readmission Risk Interventions No flowsheet data found.

## 2021-06-06 DIAGNOSIS — R262 Difficulty in walking, not elsewhere classified: Secondary | ICD-10-CM | POA: Diagnosis not present

## 2021-06-06 DIAGNOSIS — M25562 Pain in left knee: Secondary | ICD-10-CM | POA: Diagnosis not present

## 2021-06-06 DIAGNOSIS — R600 Localized edema: Secondary | ICD-10-CM | POA: Diagnosis not present

## 2021-06-06 DIAGNOSIS — R531 Weakness: Secondary | ICD-10-CM | POA: Diagnosis not present

## 2021-06-06 DIAGNOSIS — Z4789 Encounter for other orthopedic aftercare: Secondary | ICD-10-CM | POA: Diagnosis not present

## 2021-06-06 DIAGNOSIS — Z96652 Presence of left artificial knee joint: Secondary | ICD-10-CM | POA: Diagnosis not present

## 2021-06-09 DIAGNOSIS — R600 Localized edema: Secondary | ICD-10-CM | POA: Diagnosis not present

## 2021-06-09 DIAGNOSIS — M25562 Pain in left knee: Secondary | ICD-10-CM | POA: Diagnosis not present

## 2021-06-09 DIAGNOSIS — R262 Difficulty in walking, not elsewhere classified: Secondary | ICD-10-CM | POA: Diagnosis not present

## 2021-06-09 DIAGNOSIS — Z96652 Presence of left artificial knee joint: Secondary | ICD-10-CM | POA: Diagnosis not present

## 2021-06-09 DIAGNOSIS — Z4789 Encounter for other orthopedic aftercare: Secondary | ICD-10-CM | POA: Diagnosis not present

## 2021-06-09 DIAGNOSIS — R531 Weakness: Secondary | ICD-10-CM | POA: Diagnosis not present

## 2021-06-11 DIAGNOSIS — R600 Localized edema: Secondary | ICD-10-CM | POA: Diagnosis not present

## 2021-06-11 DIAGNOSIS — Z96652 Presence of left artificial knee joint: Secondary | ICD-10-CM | POA: Diagnosis not present

## 2021-06-11 DIAGNOSIS — R262 Difficulty in walking, not elsewhere classified: Secondary | ICD-10-CM | POA: Diagnosis not present

## 2021-06-11 DIAGNOSIS — M25562 Pain in left knee: Secondary | ICD-10-CM | POA: Diagnosis not present

## 2021-06-11 DIAGNOSIS — R531 Weakness: Secondary | ICD-10-CM | POA: Diagnosis not present

## 2021-06-11 DIAGNOSIS — Z4789 Encounter for other orthopedic aftercare: Secondary | ICD-10-CM | POA: Diagnosis not present

## 2021-06-13 DIAGNOSIS — Z4789 Encounter for other orthopedic aftercare: Secondary | ICD-10-CM | POA: Diagnosis not present

## 2021-06-13 DIAGNOSIS — R531 Weakness: Secondary | ICD-10-CM | POA: Diagnosis not present

## 2021-06-13 DIAGNOSIS — R600 Localized edema: Secondary | ICD-10-CM | POA: Diagnosis not present

## 2021-06-13 DIAGNOSIS — M25562 Pain in left knee: Secondary | ICD-10-CM | POA: Diagnosis not present

## 2021-06-13 DIAGNOSIS — Z96652 Presence of left artificial knee joint: Secondary | ICD-10-CM | POA: Diagnosis not present

## 2021-06-13 DIAGNOSIS — R262 Difficulty in walking, not elsewhere classified: Secondary | ICD-10-CM | POA: Diagnosis not present

## 2021-06-16 DIAGNOSIS — M25562 Pain in left knee: Secondary | ICD-10-CM | POA: Diagnosis not present

## 2021-06-16 DIAGNOSIS — R262 Difficulty in walking, not elsewhere classified: Secondary | ICD-10-CM | POA: Diagnosis not present

## 2021-06-16 DIAGNOSIS — R600 Localized edema: Secondary | ICD-10-CM | POA: Diagnosis not present

## 2021-06-16 DIAGNOSIS — Z96652 Presence of left artificial knee joint: Secondary | ICD-10-CM | POA: Diagnosis not present

## 2021-06-16 DIAGNOSIS — Z4789 Encounter for other orthopedic aftercare: Secondary | ICD-10-CM | POA: Diagnosis not present

## 2021-06-16 DIAGNOSIS — R531 Weakness: Secondary | ICD-10-CM | POA: Diagnosis not present

## 2021-06-18 DIAGNOSIS — M25562 Pain in left knee: Secondary | ICD-10-CM | POA: Diagnosis not present

## 2021-06-18 DIAGNOSIS — R600 Localized edema: Secondary | ICD-10-CM | POA: Diagnosis not present

## 2021-06-18 DIAGNOSIS — R531 Weakness: Secondary | ICD-10-CM | POA: Diagnosis not present

## 2021-06-18 DIAGNOSIS — R262 Difficulty in walking, not elsewhere classified: Secondary | ICD-10-CM | POA: Diagnosis not present

## 2021-06-18 DIAGNOSIS — Z4789 Encounter for other orthopedic aftercare: Secondary | ICD-10-CM | POA: Diagnosis not present

## 2021-06-18 DIAGNOSIS — Z96652 Presence of left artificial knee joint: Secondary | ICD-10-CM | POA: Diagnosis not present

## 2021-06-18 NOTE — Discharge Summary (Signed)
Physician Discharge Summary   Patient ID: Natalie Fowler MRN: [id number] DOB/AGE: 04/24/1946 75 y.o.  Admit date: 06/03/2021 Discharge date: 06/04/2021  Primary Diagnosis: Left knee osteoarthritis.  Admission Diagnoses:  Past Medical History:  Diagnosis Date   Acid reflux    Arthritis    [name] tumor    benign   Osteopenia    Ovarian cyst, bilateral    Pneumonia    PONV (postoperative nausea and vomiting)    Pre-diabetes    Vertigo    Vitamin D deficiency    Discharge Diagnoses:   Active Problems:   S/P total knee arthroplasty, left  Estimated body mass index is 33.28 kg/m as calculated from the following:   Height as of this encounter: 5' (1.524 m).   Weight as of this encounter: 77.3 kg.  Procedure:  Procedure(s) (LRB): TOTAL KNEE ARTHROPLASTY (Left)   Consults: None  HPI: Natalie Fowler is a 75 y.o. female patient of  mine.  The patient had been seen, evaluated, and treated for months conservatively in the  office with medication, activity modification, and injections.  The patient had  radiographic changes of bone-on-bone arthritis with endplate sclerosis and osteophytes noted.  Based on the radiographic changes and failed conservative measures, the patient  decided to proceed with definitive treatment, total knee replacement.  Risks of infection, DVT, component failure, need for revision surgery, neurovascular injury were reviewed in the office setting.  The postop course was reviewed stressing the efforts to maximize post-operative satisfaction and function.  Consent was obtained for benefit of pain  relief.  Laboratory Data: Admission on 06/03/2021, Discharged on 06/04/2021  Component Date Value Ref Range Status   ABO/RH(D) 06/03/2021 A POS   Final   Antibody Screen 06/03/2021 NEG   Final   Sample Expiration 06/03/2021    Final                   Value:06/06/2021,2359 Performed at [facility], [place] [address] [place], [place] [address]     ABO/RH(D) 06/03/2021    Final                   Value:A POS Performed at [facility], [place] [address], [place], [place] [address]    WBC 06/04/2021 14.9 (A) 4.0 - 10.5 K/uL Final   RBC 06/04/2021 3.93  3.87 - 5.11 MIL/uL Final   Hemoglobin 06/04/2021 11.7 (A) 12.0 - 15.0 g/dL Final   HCT 06/04/2021 35.4 (A) 36.0 - 46.0 % Final   MCV 06/04/2021 90.1  80.0 - 100.0 fL Final   MCH 06/04/2021 29.8  26.0 - 34.0 pg Final   MCHC 06/04/2021 33.1  30.0 - 36.0 g/dL Final   RDW 06/04/2021 12.8  11.5 - 15.5 % Final   Platelets 06/04/2021 163  150 - 400 K/uL Final   nRBC 06/04/2021 0.0  0.0 - 0.2 % Final   Performed at [facility], [place] [address] [place], [place] [address]   Sodium 06/04/2021 137  135 - 145 mmol/L Final   Potassium 06/04/2021 4.2  3.5 - 5.1 mmol/L Final   Chloride 06/04/2021 105  98 - 111 mmol/L Final   CO2 06/04/2021 23  22 - 32 mmol/L Final   Glucose, Bld 06/04/2021 312 (A) 70 - 99 mg/dL Final   Glucose reference range applies only to samples taken after fasting for at least 8 hours.   BUN 06/04/2021 19  8 - 23 mg/dL Final   Creatinine, Ser  06/04/2021 1.04 (A) 0.44 - 1.00 mg/dL Final   Calcium 06/04/2021 8.2 (A) 8.9 - 10.3 mg/dL Final   GFR, Estimated 06/04/2021 57 (A) >60 mL/min Final   Comment: (NOTE) Calculated using the CKD-EPI Creatinine Equation (2021)    Anion gap 06/04/2021 9  5 - 15 Final   Performed at [facility], [place] [address] [place], [place] [address]  Hospital Outpatient Visit on 05/30/2021  Component Date Value Ref Range Status   SARS Coronavirus 2 05/30/2021 NEGATIVE  NEGATIVE Final   Comment: (NOTE) SARS-CoV-2 target nucleic acids are NOT DETECTED.  The SARS-CoV-2 RNA is generally detectable in upper and lower respiratory specimens during the acute phase of infection. Negative results do not preclude SARS-CoV-2 infection, do not rule out co-infections with other pathogens, and should not be  used as the sole basis for treatment or other patient management decisions. Negative results must be combined with clinical observations, patient history, and epidemiological information. The expected result is Negative.  Fact Sheet for Patients: SugarRoll.be  Fact Sheet for Healthcare Providers: https://www.woods-mathews.com/  This test is not yet approved or cleared by the [place] FDA and  has been authorized for detection and/or diagnosis of SARS-CoV-2 by FDA under an Emergency Use Authorization (EUA). This EUA will remain  in effect (meaning this test can be used) for the duration of the COVID-19 declaration under Se                          ction 564(b)(1) of the Act, 21 U.S.C. section 360bbb-3(b)(1), unless the authorization is terminated or revoked sooner.  Performed at [facility], [place] [address] [place], [place] [address]   Hospital Outpatient Visit on 05/28/2021  Component Date Value Ref Range Status   MRSA, PCR 05/28/2021 NEGATIVE  NEGATIVE Final   Staphylococcus aureus 05/28/2021 NEGATIVE  NEGATIVE Final   Comment: (NOTE) The Xpert SA Assay (FDA approved for NASAL specimens in patients 58 years of age and older), is one component of a comprehensive surveillance program. It is not intended to diagnose infection nor to guide or monitor treatment. Performed at [facility], [place] [address], [place], [place] [address]   Office Visit on 05/19/2021  Component Date Value Ref Range Status   SARS-CoV-2, NAA 05/19/2021 Not Detected  Not Detected Final   Comment: This nucleic acid amplification test was developed and its performance characteristics determined by [employer]. Nucleic acid amplification tests include RT-PCR and TMA. This test has not been FDA cleared or approved. This test has been authorized by FDA under an Emergency Use Authorization (EUA). This test is only authorized for  the duration of time the declaration that circumstances exist justifying the authorization of the emergency use of in vitro diagnostic tests for detection of SARS-CoV-2 virus and/or diagnosis of COVID-19 infection under section 564(b)(1) of the Act, 21 U.S.C. [id number]) (1), unless the authorization is terminated or revoked sooner. When diagnostic testing is negative, the possibility of a false negative result should be considered in the context of a patient's recent exposures and the presence of clinical signs and symptoms consistent with COVID-19. An individual without symptoms of COVID-19 and who is not shedding SARS-CoV-2 virus wo                          uld expect to have a negative (not detected) result in this assay.    SARS-CoV-2, NAA 2  DAY TAT 05/19/2021 Performed   Final  Office Visit on 05/07/2021  Component Date Value Ref Range Status   WBC 05/07/2021 7.1  4.0 - 10.5 K/uL Final   RBC 05/07/2021 4.93  3.87 - 5.11 Mil/uL Final   Platelets 05/07/2021 201.0  150.0 - 400.0 K/uL Final   Hemoglobin 05/07/2021 14.6  12.0 - 15.0 g/dL Final   HCT 05/07/2021 43.6  36.0 - 46.0 % Final   MCV 05/07/2021 88.4  78.0 - 100.0 fl Final   MCHC 05/07/2021 33.6  30.0 - 36.0 g/dL Final   RDW 05/07/2021 13.3  11.5 - 15.5 % Final   Sodium 05/07/2021 140  135 - 145 mEq/L Final   Potassium 05/07/2021 4.2  3.5 - 5.1 mEq/L Final   Chloride 05/07/2021 102  96 - 112 mEq/L Final   CO2 05/07/2021 29  19 - 32 mEq/L Final   Glucose, Bld 05/07/2021 103 (A) 70 - 99 mg/dL Final   BUN 05/07/2021 15  6 - 23 mg/dL Final   Creatinine, Ser 05/07/2021 0.96  0.40 - 1.20 mg/dL Final   Total Bilirubin 05/07/2021 0.6  0.2 - 1.2 mg/dL Final   Alkaline Phosphatase 05/07/2021 52  39 - 117 U/L Final   AST 05/07/2021 19  0 - 37 U/L Final   ALT 05/07/2021 28  0 - 35 U/L Final   Total Protein 05/07/2021 6.9  6.0 - 8.3 g/dL Final   Albumin 05/07/2021 4.2  3.5 - 5.2 g/dL Final   GFR 05/07/2021 58.85 (A) >60.00 mL/min  Final   Calculated using the CKD-EPI Creatinine Equation (2021)   Calcium 05/07/2021 9.6  8.4 - 10.5 mg/dL Final   Hgb A1c MFr Bld 05/07/2021 6.9 (A) 4.6 - 6.5 % Final   Glycemic Control Guidelines for People with Diabetes:Non Diabetic:  <6%Goal of Therapy: <7%Additional Action Suggested:  >8%    Cholesterol 05/07/2021 218 (A) 0 - 200 mg/dL Final   ATP III Classification       Desirable:  < 200 mg/dL               Borderline High:  200 - 239 mg/dL          High:  > = 240 mg/dL   Triglycerides 05/07/2021 173.0 (A) 0.0 - 149.0 mg/dL Final   Normal:  <150 mg/dLBorderline High:  150 - 199 mg/dL   HDL 05/07/2021 45.70  >39.00 mg/dL Final   VLDL 05/07/2021 34.6  0.0 - 40.0 mg/dL Final   LDL Cholesterol 05/07/2021 137 (A) 0 - 99 mg/dL Final   Total CHOL/HDL Ratio 05/07/2021 5   Final                  Men          Women1/2 Average Risk     3.4          3.3Average Risk          5.0          4.42X Average Risk          9.6          7.13X Average Risk          15.0          11.0                       NonHDL 05/07/2021 171.85   Final   NOTE:  Non-HDL goal should be 30 mg/dL higher than patient's LDL goal (  i.e. LDL goal of < 70 mg/dL, would have non-HDL goal of < 100 mg/dL)     X-Rays:DG Chest 2 View  Result Date: 05/19/2021 CLINICAL DATA:  Cough EXAM: CHEST - 2 VIEW COMPARISON:  09/26/2020 FINDINGS: The heart size and mediastinal contours are within normal limits. Both lungs are clear. The visualized skeletal structures are unremarkable. IMPRESSION: No active cardiopulmonary disease. Electronically Signed   By: Donavan Foil M.D.   On: 05/19/2021 16:09    EKG: Orders placed or performed in visit on 05/07/21   EKG 12-Lead     Hospital Course: Natalie Fowler is a 75 y.o. who was admitted to [facility]. They were brought to the operating room on 06/03/2021 and underwent Procedure(s): TOTAL KNEE ARTHROPLASTY.  Patient tolerated the procedure well and was later transferred to the recovery room  and then to the orthopaedic floor for postoperative care. They were given PO and IV analgesics for pain control following their surgery. They were given 24 hours of postoperative antibiotics of  Anti-infectives (From admission, onward)    Start     Dose/Rate Route Frequency Ordered Stop   06/03/21 1800  ceFAZolin (ANCEF) 2 g in sodium chloride 0.9 % 100 mL IVPB        2 g 200 mL/hr over 30 Minutes Intravenous Every 6 hours 06/03/21 1246 06/04/21 0202   06/03/21 0930  ceFAZolin (ANCEF) 2 g in sodium chloride 0.9 % 100 mL IVPB        2 g 200 mL/hr over 30 Minutes Intravenous  Once 06/03/21 0920 06/03/21 0956   06/03/21 0734  sodium chloride 0.9 % with ceFAZolin (ANCEF) ADS Med       Note to Pharmacy: Randa Evens  : cabinet override      06/03/21 0734 06/03/21 0951      and started on DVT prophylaxis in the form of Aspirin.   PT and OT were ordered for total joint protocol. Discharge planning consulted to help with postop disposition and equipment needs.  Patient had a good night on the evening of surgery. They started to get up OOB with therapy on POD #0. Pt was seen during rounds and was ready to go home pending progress with therapy.She worked with therapy on POD #1 and was meeting her goals. Pt was discharged to home later that day in stable condition.  Diet: Regular diet Activity: WBAT Follow-up: in 2 weeks Disposition: Home Discharged Condition: good   Discharge Instructions     Call MD / Call 911   Complete by: As directed    If you experience chest pain or shortness of breath, CALL 911 and be transported to the hospital emergency room.  If you develope a fever above 101 F, pus (white drainage) or increased drainage or redness at the wound, or calf pain, call your surgeon's office.   Change dressing   Complete by: As directed    Maintain surgical dressing until follow up in the clinic. If the edges start to pull up, may reinforce with tape. If the dressing is no longer  working, may remove and cover with gauze and tape, but must keep the area dry and clean.  Call with any questions or concerns.   Constipation Prevention   Complete by: As directed    Drink plenty of fluids.  Prune juice may be helpful.  You may use a stool softener, such as Colace (over the counter) 100 mg twice a day.  Use MiraLax (over the counter) for constipation  as needed.   Diet - low sodium heart healthy   Complete by: As directed    Increase activity slowly as tolerated   Complete by: As directed    Weight bearing as tolerated with assist device (walker, cane, etc) as directed, use it as long as suggested by your surgeon or therapist, typically at least 4-6 weeks.   Post-operative opioid taper instructions:   Complete by: As directed    POST-OPERATIVE OPIOID TAPER INSTRUCTIONS: It is important to wean off of your opioid medication as soon as possible. If you do not need pain medication after your surgery it is ok to stop day one. Opioids include: Codeine, Hydrocodone(Norco, Vicodin), Oxycodone(Percocet, oxycontin) and hydromorphone amongst others.  Long term and even short term use of opiods can cause: Increased pain response Dependence Constipation Depression Respiratory depression And more.  Withdrawal symptoms can include Flu like symptoms Nausea, vomiting And more Techniques to manage these symptoms Hydrate well Eat regular healthy meals Stay active Use relaxation techniques(deep breathing, meditating, yoga) Do Not substitute Alcohol to help with tapering If you have been on opioids for less than two weeks and do not have pain than it is ok to stop all together.  Plan to wean off of opioids This plan should start within one week post op of your joint replacement. Maintain the same interval or time between taking each dose and first decrease the dose.  Cut the total daily intake of opioids by one tablet each day Next start to increase the time between doses. The last  dose that should be eliminated is the evening dose.      TED hose   Complete by: As directed    Use stockings (TED hose) for 2 weeks on both leg(s).  You may remove them at night for sleeping.      Allergies as of 06/04/2021       Reactions   Vioxx [rofecoxib] Rash        Medication List     STOP taking these medications    aspirin EC 81 MG tablet Replaced by: aspirin 81 MG chewable tablet       TAKE these medications    acetaminophen 325 MG tablet Commonly known as: TYLENOL Take 650 mg by mouth every 6 (six) hours as needed for moderate pain.   albuterol 108 (90 Base) MCG/ACT inhaler Commonly known as: VENTOLIN HFA Inhale 2 puffs into the lungs every 6 (six) hours as needed for wheezing or shortness of breath.   aspirin 81 MG chewable tablet Chew 1 tablet (81 mg total) by mouth 2 (two) times daily for 28 days. Replaces: aspirin EC 81 MG tablet   cholecalciferol 1000 units tablet Commonly known as: VITAMIN D Take 1,000 Units by mouth daily.   docusate sodium 100 MG capsule Commonly known as: COLACE Take 1 capsule (100 mg total) by mouth 2 (two) times daily.   doxycycline 100 MG capsule Commonly known as: VIBRAMYCIN Take 1 capsule (100 mg total) by mouth 2 (two) times daily.   methocarbamol 500 MG tablet Commonly known as: ROBAXIN Take 1 tablet (500 mg total) by mouth every 6 (six) hours as needed for muscle spasms.   oxyCODONE 5 MG immediate release tablet Commonly known as: Oxy IR/ROXICODONE Take 1-2 tablets (5-10 mg total) by mouth every 6 (six) hours as needed for severe pain. Max of 6 tabs daily   polyethylene glycol 17 g packet Commonly known as: MIRALAX / GLYCOLAX Take 17 g by mouth daily as  needed for mild constipation.   PREVAGEN PO Take 1 capsule by mouth daily.   raloxifene 60 MG tablet Commonly known as: EVISTA Take 1 tablet (60 mg total) by mouth daily.   SIMILASAN EYE DROPS #1 OP Place 1 drop into both eyes daily as needed  (itching/irritation).               Discharge Care Instructions  (From admission, onward)           Start     Ordered   06/04/21 0000  Change dressing       Comments: Maintain surgical dressing until follow up in the clinic. If the edges start to pull up, may reinforce with tape. If the dressing is no longer working, may remove and cover with gauze and tape, but must keep the area dry and clean.  Call with any questions or concerns.   06/04/21 [id number]            Follow-up Information     Paralee Cancel, MD. Schedule an appointment as soon as possible for a visit in 2 week(s).   Specialty: Orthopedic Surgery Contact information: [address] [place] [address] [phone]                 Signed: Griffith Citron, PA-C Orthopedic Surgery 06/18/2021, 1:38 PM

## 2021-06-20 DIAGNOSIS — R531 Weakness: Secondary | ICD-10-CM | POA: Diagnosis not present

## 2021-06-20 DIAGNOSIS — M25562 Pain in left knee: Secondary | ICD-10-CM | POA: Diagnosis not present

## 2021-06-20 DIAGNOSIS — Z96652 Presence of left artificial knee joint: Secondary | ICD-10-CM | POA: Diagnosis not present

## 2021-06-20 DIAGNOSIS — R600 Localized edema: Secondary | ICD-10-CM | POA: Diagnosis not present

## 2021-06-20 DIAGNOSIS — R262 Difficulty in walking, not elsewhere classified: Secondary | ICD-10-CM | POA: Diagnosis not present

## 2021-06-20 DIAGNOSIS — Z4789 Encounter for other orthopedic aftercare: Secondary | ICD-10-CM | POA: Diagnosis not present

## 2021-06-23 DIAGNOSIS — Z4789 Encounter for other orthopedic aftercare: Secondary | ICD-10-CM | POA: Diagnosis not present

## 2021-06-23 DIAGNOSIS — R262 Difficulty in walking, not elsewhere classified: Secondary | ICD-10-CM | POA: Diagnosis not present

## 2021-06-23 DIAGNOSIS — R600 Localized edema: Secondary | ICD-10-CM | POA: Diagnosis not present

## 2021-06-23 DIAGNOSIS — Z96652 Presence of left artificial knee joint: Secondary | ICD-10-CM | POA: Diagnosis not present

## 2021-06-23 DIAGNOSIS — R531 Weakness: Secondary | ICD-10-CM | POA: Diagnosis not present

## 2021-06-23 DIAGNOSIS — M25562 Pain in left knee: Secondary | ICD-10-CM | POA: Diagnosis not present

## 2021-06-25 DIAGNOSIS — Z4789 Encounter for other orthopedic aftercare: Secondary | ICD-10-CM | POA: Diagnosis not present

## 2021-06-25 DIAGNOSIS — R600 Localized edema: Secondary | ICD-10-CM | POA: Diagnosis not present

## 2021-06-25 DIAGNOSIS — R531 Weakness: Secondary | ICD-10-CM | POA: Diagnosis not present

## 2021-06-25 DIAGNOSIS — M25562 Pain in left knee: Secondary | ICD-10-CM | POA: Diagnosis not present

## 2021-06-25 DIAGNOSIS — R262 Difficulty in walking, not elsewhere classified: Secondary | ICD-10-CM | POA: Diagnosis not present

## 2021-06-25 DIAGNOSIS — Z96652 Presence of left artificial knee joint: Secondary | ICD-10-CM | POA: Diagnosis not present

## 2021-06-26 ENCOUNTER — Other Ambulatory Visit (HOSPITAL_COMMUNITY): Payer: Self-pay | Admitting: Orthopedic Surgery

## 2021-06-26 ENCOUNTER — Ambulatory Visit (HOSPITAL_COMMUNITY)
Admission: RE | Admit: 2021-06-26 | Discharge: 2021-06-26 | Disposition: A | Discharge status: Home / Self Care | Payer: PPO | Source: Ambulatory Visit | Attending: Orthopedic Surgery | Admitting: Orthopedic Surgery

## 2021-06-26 ENCOUNTER — Other Ambulatory Visit: Payer: Self-pay

## 2021-06-26 DIAGNOSIS — R52 Pain, unspecified: Secondary | ICD-10-CM | POA: Insufficient documentation

## 2021-06-26 DIAGNOSIS — M79605 Pain in left leg: Secondary | ICD-10-CM | POA: Diagnosis not present

## 2021-06-26 DIAGNOSIS — M79604 Pain in right leg: Secondary | ICD-10-CM | POA: Diagnosis not present

## 2021-06-26 DIAGNOSIS — M79662 Pain in left lower leg: Secondary | ICD-10-CM | POA: Diagnosis not present

## 2021-06-26 DIAGNOSIS — Z96652 Presence of left artificial knee joint: Secondary | ICD-10-CM | POA: Diagnosis not present

## 2021-06-26 DIAGNOSIS — M79661 Pain in right lower leg: Secondary | ICD-10-CM | POA: Diagnosis not present

## 2021-06-26 DIAGNOSIS — Z471 Aftercare following joint replacement surgery: Secondary | ICD-10-CM | POA: Diagnosis not present

## 2021-06-26 NOTE — Progress Notes (Signed)
LLE venous duplex has been completed.  Phone number given to call preliminary results was incorrect.  Dr. Alvan Dame (attending) and Dr. Lorelei Pont (PCP) both messaged with preliminary results.  Results can be found under chart review under CV PROC. 06/26/2021 5:20 PM Estuardo Frisbee RVT, RDMS

## 2021-06-27 DIAGNOSIS — M25562 Pain in left knee: Secondary | ICD-10-CM | POA: Diagnosis not present

## 2021-06-27 DIAGNOSIS — Z96652 Presence of left artificial knee joint: Secondary | ICD-10-CM | POA: Diagnosis not present

## 2021-06-27 DIAGNOSIS — Z4789 Encounter for other orthopedic aftercare: Secondary | ICD-10-CM | POA: Diagnosis not present

## 2021-06-27 DIAGNOSIS — R262 Difficulty in walking, not elsewhere classified: Secondary | ICD-10-CM | POA: Diagnosis not present

## 2021-06-27 DIAGNOSIS — R531 Weakness: Secondary | ICD-10-CM | POA: Diagnosis not present

## 2021-06-27 DIAGNOSIS — R600 Localized edema: Secondary | ICD-10-CM | POA: Diagnosis not present

## 2021-06-30 DIAGNOSIS — R600 Localized edema: Secondary | ICD-10-CM | POA: Diagnosis not present

## 2021-06-30 DIAGNOSIS — Z96652 Presence of left artificial knee joint: Secondary | ICD-10-CM | POA: Diagnosis not present

## 2021-06-30 DIAGNOSIS — M25562 Pain in left knee: Secondary | ICD-10-CM | POA: Diagnosis not present

## 2021-06-30 DIAGNOSIS — R262 Difficulty in walking, not elsewhere classified: Secondary | ICD-10-CM | POA: Diagnosis not present

## 2021-06-30 DIAGNOSIS — Z4789 Encounter for other orthopedic aftercare: Secondary | ICD-10-CM | POA: Diagnosis not present

## 2021-06-30 DIAGNOSIS — R531 Weakness: Secondary | ICD-10-CM | POA: Diagnosis not present

## 2021-07-03 DIAGNOSIS — Z96652 Presence of left artificial knee joint: Secondary | ICD-10-CM | POA: Diagnosis not present

## 2021-07-03 DIAGNOSIS — M25562 Pain in left knee: Secondary | ICD-10-CM | POA: Diagnosis not present

## 2021-07-03 DIAGNOSIS — R531 Weakness: Secondary | ICD-10-CM | POA: Diagnosis not present

## 2021-07-03 DIAGNOSIS — Z4789 Encounter for other orthopedic aftercare: Secondary | ICD-10-CM | POA: Diagnosis not present

## 2021-07-03 DIAGNOSIS — R600 Localized edema: Secondary | ICD-10-CM | POA: Diagnosis not present

## 2021-07-03 DIAGNOSIS — R262 Difficulty in walking, not elsewhere classified: Secondary | ICD-10-CM | POA: Diagnosis not present

## 2021-07-07 DIAGNOSIS — Z96652 Presence of left artificial knee joint: Secondary | ICD-10-CM | POA: Diagnosis not present

## 2021-07-07 DIAGNOSIS — R600 Localized edema: Secondary | ICD-10-CM | POA: Diagnosis not present

## 2021-07-07 DIAGNOSIS — M25562 Pain in left knee: Secondary | ICD-10-CM | POA: Diagnosis not present

## 2021-07-07 DIAGNOSIS — R262 Difficulty in walking, not elsewhere classified: Secondary | ICD-10-CM | POA: Diagnosis not present

## 2021-07-07 DIAGNOSIS — R531 Weakness: Secondary | ICD-10-CM | POA: Diagnosis not present

## 2021-07-07 DIAGNOSIS — Z4789 Encounter for other orthopedic aftercare: Secondary | ICD-10-CM | POA: Diagnosis not present

## 2021-07-10 DIAGNOSIS — R531 Weakness: Secondary | ICD-10-CM | POA: Diagnosis not present

## 2021-07-10 DIAGNOSIS — R600 Localized edema: Secondary | ICD-10-CM | POA: Diagnosis not present

## 2021-07-10 DIAGNOSIS — Z4789 Encounter for other orthopedic aftercare: Secondary | ICD-10-CM | POA: Diagnosis not present

## 2021-07-10 DIAGNOSIS — Z96652 Presence of left artificial knee joint: Secondary | ICD-10-CM | POA: Diagnosis not present

## 2021-07-10 DIAGNOSIS — M25562 Pain in left knee: Secondary | ICD-10-CM | POA: Diagnosis not present

## 2021-07-10 DIAGNOSIS — R262 Difficulty in walking, not elsewhere classified: Secondary | ICD-10-CM | POA: Diagnosis not present

## 2021-07-11 DIAGNOSIS — Z471 Aftercare following joint replacement surgery: Secondary | ICD-10-CM | POA: Diagnosis not present

## 2021-07-11 DIAGNOSIS — Z96652 Presence of left artificial knee joint: Secondary | ICD-10-CM | POA: Diagnosis not present

## 2021-07-14 DIAGNOSIS — M25562 Pain in left knee: Secondary | ICD-10-CM | POA: Diagnosis not present

## 2021-07-14 DIAGNOSIS — R600 Localized edema: Secondary | ICD-10-CM | POA: Diagnosis not present

## 2021-07-14 DIAGNOSIS — R531 Weakness: Secondary | ICD-10-CM | POA: Diagnosis not present

## 2021-07-14 DIAGNOSIS — Z4789 Encounter for other orthopedic aftercare: Secondary | ICD-10-CM | POA: Diagnosis not present

## 2021-07-14 DIAGNOSIS — R262 Difficulty in walking, not elsewhere classified: Secondary | ICD-10-CM | POA: Diagnosis not present

## 2021-07-14 DIAGNOSIS — Z96652 Presence of left artificial knee joint: Secondary | ICD-10-CM | POA: Diagnosis not present

## 2021-07-17 DIAGNOSIS — Z96652 Presence of left artificial knee joint: Secondary | ICD-10-CM | POA: Diagnosis not present

## 2021-07-17 DIAGNOSIS — R262 Difficulty in walking, not elsewhere classified: Secondary | ICD-10-CM | POA: Diagnosis not present

## 2021-07-17 DIAGNOSIS — M25562 Pain in left knee: Secondary | ICD-10-CM | POA: Diagnosis not present

## 2021-07-17 DIAGNOSIS — R600 Localized edema: Secondary | ICD-10-CM | POA: Diagnosis not present

## 2021-07-17 DIAGNOSIS — R531 Weakness: Secondary | ICD-10-CM | POA: Diagnosis not present

## 2021-07-17 DIAGNOSIS — Z4789 Encounter for other orthopedic aftercare: Secondary | ICD-10-CM | POA: Diagnosis not present

## 2021-07-21 ENCOUNTER — Ambulatory Visit: Payer: PPO | Admitting: Family Medicine

## 2021-07-21 DIAGNOSIS — R531 Weakness: Secondary | ICD-10-CM | POA: Diagnosis not present

## 2021-07-21 DIAGNOSIS — Z96652 Presence of left artificial knee joint: Secondary | ICD-10-CM | POA: Diagnosis not present

## 2021-07-21 DIAGNOSIS — M25562 Pain in left knee: Secondary | ICD-10-CM | POA: Diagnosis not present

## 2021-07-21 DIAGNOSIS — R262 Difficulty in walking, not elsewhere classified: Secondary | ICD-10-CM | POA: Diagnosis not present

## 2021-07-21 DIAGNOSIS — Z4789 Encounter for other orthopedic aftercare: Secondary | ICD-10-CM | POA: Diagnosis not present

## 2021-07-21 DIAGNOSIS — R600 Localized edema: Secondary | ICD-10-CM | POA: Diagnosis not present

## 2021-07-24 DIAGNOSIS — R531 Weakness: Secondary | ICD-10-CM | POA: Diagnosis not present

## 2021-07-24 DIAGNOSIS — Z96652 Presence of left artificial knee joint: Secondary | ICD-10-CM | POA: Diagnosis not present

## 2021-07-24 DIAGNOSIS — R262 Difficulty in walking, not elsewhere classified: Secondary | ICD-10-CM | POA: Diagnosis not present

## 2021-07-24 DIAGNOSIS — Z4789 Encounter for other orthopedic aftercare: Secondary | ICD-10-CM | POA: Diagnosis not present

## 2021-07-24 DIAGNOSIS — R600 Localized edema: Secondary | ICD-10-CM | POA: Diagnosis not present

## 2021-07-24 DIAGNOSIS — M25562 Pain in left knee: Secondary | ICD-10-CM | POA: Diagnosis not present

## 2021-07-29 DIAGNOSIS — R600 Localized edema: Secondary | ICD-10-CM | POA: Diagnosis not present

## 2021-07-29 DIAGNOSIS — R531 Weakness: Secondary | ICD-10-CM | POA: Diagnosis not present

## 2021-07-29 DIAGNOSIS — Z4789 Encounter for other orthopedic aftercare: Secondary | ICD-10-CM | POA: Diagnosis not present

## 2021-07-29 DIAGNOSIS — R262 Difficulty in walking, not elsewhere classified: Secondary | ICD-10-CM | POA: Diagnosis not present

## 2021-07-29 DIAGNOSIS — Z96652 Presence of left artificial knee joint: Secondary | ICD-10-CM | POA: Diagnosis not present

## 2021-07-29 DIAGNOSIS — M25562 Pain in left knee: Secondary | ICD-10-CM | POA: Diagnosis not present

## 2021-07-31 DIAGNOSIS — Z4789 Encounter for other orthopedic aftercare: Secondary | ICD-10-CM | POA: Diagnosis not present

## 2021-07-31 DIAGNOSIS — R600 Localized edema: Secondary | ICD-10-CM | POA: Diagnosis not present

## 2021-07-31 DIAGNOSIS — M25562 Pain in left knee: Secondary | ICD-10-CM | POA: Diagnosis not present

## 2021-07-31 DIAGNOSIS — Z96652 Presence of left artificial knee joint: Secondary | ICD-10-CM | POA: Diagnosis not present

## 2021-07-31 DIAGNOSIS — R262 Difficulty in walking, not elsewhere classified: Secondary | ICD-10-CM | POA: Diagnosis not present

## 2021-07-31 DIAGNOSIS — R531 Weakness: Secondary | ICD-10-CM | POA: Diagnosis not present

## 2021-08-04 DIAGNOSIS — R600 Localized edema: Secondary | ICD-10-CM | POA: Diagnosis not present

## 2021-08-04 DIAGNOSIS — R531 Weakness: Secondary | ICD-10-CM | POA: Diagnosis not present

## 2021-08-04 DIAGNOSIS — R262 Difficulty in walking, not elsewhere classified: Secondary | ICD-10-CM | POA: Diagnosis not present

## 2021-08-04 DIAGNOSIS — Z4789 Encounter for other orthopedic aftercare: Secondary | ICD-10-CM | POA: Diagnosis not present

## 2021-08-04 DIAGNOSIS — Z96652 Presence of left artificial knee joint: Secondary | ICD-10-CM | POA: Diagnosis not present

## 2021-08-04 DIAGNOSIS — M25562 Pain in left knee: Secondary | ICD-10-CM | POA: Diagnosis not present

## 2021-08-07 DIAGNOSIS — R531 Weakness: Secondary | ICD-10-CM | POA: Diagnosis not present

## 2021-08-07 DIAGNOSIS — Z96652 Presence of left artificial knee joint: Secondary | ICD-10-CM | POA: Diagnosis not present

## 2021-08-07 DIAGNOSIS — Z4789 Encounter for other orthopedic aftercare: Secondary | ICD-10-CM | POA: Diagnosis not present

## 2021-08-07 DIAGNOSIS — R262 Difficulty in walking, not elsewhere classified: Secondary | ICD-10-CM | POA: Diagnosis not present

## 2021-08-07 DIAGNOSIS — M25562 Pain in left knee: Secondary | ICD-10-CM | POA: Diagnosis not present

## 2021-08-07 DIAGNOSIS — R600 Localized edema: Secondary | ICD-10-CM | POA: Diagnosis not present

## 2021-08-11 DIAGNOSIS — R262 Difficulty in walking, not elsewhere classified: Secondary | ICD-10-CM | POA: Diagnosis not present

## 2021-08-11 DIAGNOSIS — Z96652 Presence of left artificial knee joint: Secondary | ICD-10-CM | POA: Diagnosis not present

## 2021-08-11 DIAGNOSIS — R531 Weakness: Secondary | ICD-10-CM | POA: Diagnosis not present

## 2021-08-11 DIAGNOSIS — M25562 Pain in left knee: Secondary | ICD-10-CM | POA: Diagnosis not present

## 2021-08-11 DIAGNOSIS — R600 Localized edema: Secondary | ICD-10-CM | POA: Diagnosis not present

## 2021-08-11 DIAGNOSIS — Z4789 Encounter for other orthopedic aftercare: Secondary | ICD-10-CM | POA: Diagnosis not present

## 2021-08-14 DIAGNOSIS — R600 Localized edema: Secondary | ICD-10-CM | POA: Diagnosis not present

## 2021-08-14 DIAGNOSIS — M25562 Pain in left knee: Secondary | ICD-10-CM | POA: Diagnosis not present

## 2021-08-14 DIAGNOSIS — Z96652 Presence of left artificial knee joint: Secondary | ICD-10-CM | POA: Diagnosis not present

## 2021-08-14 DIAGNOSIS — Z4789 Encounter for other orthopedic aftercare: Secondary | ICD-10-CM | POA: Diagnosis not present

## 2021-08-14 DIAGNOSIS — R531 Weakness: Secondary | ICD-10-CM | POA: Diagnosis not present

## 2021-08-14 DIAGNOSIS — R262 Difficulty in walking, not elsewhere classified: Secondary | ICD-10-CM | POA: Diagnosis not present

## 2021-08-18 DIAGNOSIS — M25562 Pain in left knee: Secondary | ICD-10-CM | POA: Diagnosis not present

## 2021-08-18 DIAGNOSIS — R262 Difficulty in walking, not elsewhere classified: Secondary | ICD-10-CM | POA: Diagnosis not present

## 2021-08-18 DIAGNOSIS — Z96652 Presence of left artificial knee joint: Secondary | ICD-10-CM | POA: Diagnosis not present

## 2021-08-18 DIAGNOSIS — R600 Localized edema: Secondary | ICD-10-CM | POA: Diagnosis not present

## 2021-08-18 DIAGNOSIS — Z4789 Encounter for other orthopedic aftercare: Secondary | ICD-10-CM | POA: Diagnosis not present

## 2021-08-18 DIAGNOSIS — R531 Weakness: Secondary | ICD-10-CM | POA: Diagnosis not present

## 2021-08-21 DIAGNOSIS — R531 Weakness: Secondary | ICD-10-CM | POA: Diagnosis not present

## 2021-08-21 DIAGNOSIS — R600 Localized edema: Secondary | ICD-10-CM | POA: Diagnosis not present

## 2021-08-21 DIAGNOSIS — M25562 Pain in left knee: Secondary | ICD-10-CM | POA: Diagnosis not present

## 2021-08-21 DIAGNOSIS — Z4789 Encounter for other orthopedic aftercare: Secondary | ICD-10-CM | POA: Diagnosis not present

## 2021-08-21 DIAGNOSIS — R262 Difficulty in walking, not elsewhere classified: Secondary | ICD-10-CM | POA: Diagnosis not present

## 2021-08-21 DIAGNOSIS — Z96652 Presence of left artificial knee joint: Secondary | ICD-10-CM | POA: Diagnosis not present

## 2021-08-25 ENCOUNTER — Other Ambulatory Visit: Payer: Self-pay | Admitting: Family Medicine

## 2021-08-25 DIAGNOSIS — R262 Difficulty in walking, not elsewhere classified: Secondary | ICD-10-CM | POA: Diagnosis not present

## 2021-08-25 DIAGNOSIS — Z4789 Encounter for other orthopedic aftercare: Secondary | ICD-10-CM | POA: Diagnosis not present

## 2021-08-25 DIAGNOSIS — R600 Localized edema: Secondary | ICD-10-CM | POA: Diagnosis not present

## 2021-08-25 DIAGNOSIS — Z1231 Encounter for screening mammogram for malignant neoplasm of breast: Secondary | ICD-10-CM

## 2021-08-25 DIAGNOSIS — Z96652 Presence of left artificial knee joint: Secondary | ICD-10-CM | POA: Diagnosis not present

## 2021-08-25 DIAGNOSIS — R531 Weakness: Secondary | ICD-10-CM | POA: Diagnosis not present

## 2021-08-25 DIAGNOSIS — M25562 Pain in left knee: Secondary | ICD-10-CM | POA: Diagnosis not present

## 2021-08-28 DIAGNOSIS — R262 Difficulty in walking, not elsewhere classified: Secondary | ICD-10-CM | POA: Diagnosis not present

## 2021-08-28 DIAGNOSIS — R600 Localized edema: Secondary | ICD-10-CM | POA: Diagnosis not present

## 2021-08-28 DIAGNOSIS — Z4789 Encounter for other orthopedic aftercare: Secondary | ICD-10-CM | POA: Diagnosis not present

## 2021-08-28 DIAGNOSIS — Z96652 Presence of left artificial knee joint: Secondary | ICD-10-CM | POA: Diagnosis not present

## 2021-08-28 DIAGNOSIS — M25562 Pain in left knee: Secondary | ICD-10-CM | POA: Diagnosis not present

## 2021-08-28 DIAGNOSIS — R531 Weakness: Secondary | ICD-10-CM | POA: Diagnosis not present

## 2021-09-03 DIAGNOSIS — R531 Weakness: Secondary | ICD-10-CM | POA: Diagnosis not present

## 2021-09-03 DIAGNOSIS — R600 Localized edema: Secondary | ICD-10-CM | POA: Diagnosis not present

## 2021-09-03 DIAGNOSIS — Z4789 Encounter for other orthopedic aftercare: Secondary | ICD-10-CM | POA: Diagnosis not present

## 2021-09-03 DIAGNOSIS — M25562 Pain in left knee: Secondary | ICD-10-CM | POA: Diagnosis not present

## 2021-09-03 DIAGNOSIS — R262 Difficulty in walking, not elsewhere classified: Secondary | ICD-10-CM | POA: Diagnosis not present

## 2021-09-03 DIAGNOSIS — Z96652 Presence of left artificial knee joint: Secondary | ICD-10-CM | POA: Diagnosis not present

## 2021-09-07 NOTE — Progress Notes (Addendum)
[facility] [address] [place], [place] [address] [phone]  Date:  09/11/2021   Name:  Natalie Fowler   DOB:  [birth year]   MRN:  [id number]  PCP:  Darreld Mclean, MD    Chief Complaint: 6 month follow up (Flu shot today: she would like to eliminate the cough first./) and Cough (Constant coughing. Pt says she does this around the same time every year. She has wheezing at night, )   History of Present Illness:  Natalie Fowler is a 75 y.o. very pleasant female patient who presents with the following:  Patient seen today for follow-up visit Most recent visit with myself was in June for preop clearance, planned total knee in July This went "terrible" per her report- she had a rough 6 weeks, but is finally getting better.   She is taking pain medication still at times per her surgeon She did not have any infection or other particular complication, just had a hard recovery  She does have a cough but is tested negative for COVID History of diet controlled diabetes mellitus, vaginal atrophy, osteopenia, vitamin D deficiency, family history of colon cancer, dyslipidemia  Foot exam-today Eye exam- this should be done next month, she will schedule  Urine micro COVID new booster Flu vaccine- - give today  Can update A1c Most recent labs from June  She notes a "wheezing cough" at night only for a month  No fever noted She only has the sx at night Otherwise she feels well, no particular symptoms of illness.  She notes she has had the same pattern during the winter previously  Chest film done in June -normal   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   Dyslipidemia 05/02/2021   Diet-controlled diabetes mellitus ([place]) 01/20/2021   Low back pain 11/19/2016   History of vitamin D deficiency 09/30/2016   H/O colonoscopy with polypectomy 09/30/2015   Vaginal  atrophy 09/25/2014   Vertigo    Osteopenia    Acid reflux     Past Medical History:  Diagnosis Date   Acid reflux    Arthritis    [name] tumor    benign   Osteopenia    Ovarian cyst, bilateral    Pneumonia    PONV (postoperative nausea and vomiting)    Pre-diabetes    Vertigo    Vitamin D deficiency     Past Surgical History:  Procedure Laterality Date   APPENDECTOMY     CHOLECYSTECTOMY     CYST EXCISION     CHEST    JOINT REPLACEMENT     OOPHORECTOMY  10/23/2006   DIAG LAP WITH BSO   PELVIC LAPAROSCOPY  10/23/2006   DIAG LAP WITH BSO   ROTATOR CUFF REPAIR     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   Procedure: TOTAL KNEE ARTHROPLASTY;  Surgeon: Paralee Cancel, MD;  Location: [facility];  Service: Orthopedics;  Laterality: Left;    Social History   Tobacco Use   Smoking status: Never   Smokeless tobacco: Never  Vaping Use   Vaping Use: Never used  Substance Use Topics   Alcohol use: No    Alcohol/week: 0.0 standard drinks   Drug use: Never    Family History  Problem Relation Age of Onset   Diabetes Mother    Hypertension Mother    Cancer Father  COLON CA   Osteoporosis Sister    Cancer Sister 26       PANCREATIC   Cancer Maternal Aunt        OVARIAN CANCER   Heart disease Maternal Aunt    Hypertension Daughter    Cancer Paternal Uncle        Colon cancer   Breast cancer Neg Hx     Allergies  Allergen Reactions   Vioxx [Rofecoxib] Rash    Medication list has been reviewed and updated.  Current Outpatient Medications on File Prior to Visit  Medication Sig Dispense Refill   acetaminophen (TYLENOL) 325 MG tablet Take 650 mg by mouth every 6 (six) hours as needed for moderate pain.     albuterol (VENTOLIN HFA) 108 (90 Base) MCG/ACT inhaler Inhale 2 puffs into the lungs every 6 (six) hours as needed for wheezing or shortness of breath. 18 g 1   Apoaequorin (PREVAGEN PO) Take 1 capsule by mouth daily.     cholecalciferol (VITAMIN D) 1000  units tablet Take 1,000 Units by mouth daily.     docusate sodium (COLACE) 100 MG capsule Take 1 capsule (100 mg total) by mouth 2 (two) times daily. 10 capsule 0   methocarbamol (ROBAXIN) 500 MG tablet Take 1 tablet (500 mg total) by mouth every 6 (six) hours as needed for muscle spasms. 40 tablet 0   Misc Natural Product [facility] EYE DROPS #1 OP) Place 1 drop into both eyes daily as needed (itching/irritation).     oxyCODONE (OXY IR/ROXICODONE) 5 MG immediate release tablet Take 1-2 tablets (5-10 mg total) by mouth every 6 (six) hours as needed for severe pain. Max of 6 tabs daily 42 tablet 0   polyethylene glycol (MIRALAX / GLYCOLAX) 17 g packet Take 17 g by mouth daily as needed for mild constipation. 14 each 0   raloxifene (EVISTA) 60 MG tablet Take 1 tablet (60 mg total) by mouth daily. 90 tablet 3   furosemide (LASIX) 20 MG tablet furosemide 20 mg tablet     pregabalin (LYRICA) 75 MG capsule pregabalin 75 mg capsule     No current facility-administered medications on file prior to visit.    Review of Systems:  As per HPI- otherwise negative.   Physical Examination: Vitals:   09/11/21 0837  BP: 122/80  Pulse: 90  Resp: 18  Temp: 97.8 F (36.6 C)  SpO2: 97%   Vitals:   09/11/21 0837  Weight: 173 lb 9.6 oz (78.7 kg)  Height: 5' (1.524 m)   Body mass index is 33.9 kg/m. Ideal Body Weight: Weight in (lb) to have BMI = 25: 127.7  GEN: no acute distress.  Obese, otherwise looks well HEENT: Atraumatic, Normocephalic.  Ears and Nose: No external deformity. CV: RRR, No M/G/R. No JVD. No thrill. No extra heart sounds. PULM: Mild wheezing is present bilaterally, no retractions. No resp. distress. No accessory muscle use. ABD: S, NT, ND, +BS. No rebound. No HSM. EXTR: No c/c/e PSYCH: Normally interactive. Conversant.  Foot exam normal today  Assessment and Plan: Diet-controlled diabetes mellitus ([place]) - Plan: Basic metabolic panel, Hemoglobin A1c, Microalbumin / creatinine  urine ratio  Dyslipidemia  Immunization due  Wheezing - Plan: DG Chest 2 View, Fluticasone Furoate (ARNUITY ELLIPTA) 100 MCG/ACT AEPB, albuterol (VENTOLIN HFA) 108 (90 Base) MCG/ACT inhaler  Need for influenza vaccination - Plan: Flu Vaccine QUAD High Dose(Fluad)  Patient seen today with a couple of concerns.  We will follow-up on her diabetes with labs as  above Flu vaccine today  She notes wheezing and cough at night only, has been present for about a month.  I suspect she may have seasonal allergies and perhaps some element of asthma or reactive airway disease.  We will have her start on an inhaled steroid, and albuterol as needed.  I asked her to use the inhaled steroid daily for about a month, let me know if helpful  Will also obtain a chest film today Signed Lamar Blinks, MD  Chest x-ray as below-message to patient DG Chest 2 View  Result Date: 09/11/2021 CLINICAL DATA:  Cough, wheezing EXAM: CHEST - 2 VIEW COMPARISON:  05/19/2021 FINDINGS: The heart size and mediastinal contours are within normal limits. Both lungs are clear. The visualized skeletal structures are unremarkable. IMPRESSION: No acute abnormality of the lungs. Electronically Signed   By: Delanna Ahmadi M.D.   On: 09/11/2021 10:02     Received her labs as below, message to patient Diabetes has been diet controlled, recommended that we start on metformin.  Also recommend a low-dose ACE or ARB due to microalbuminuria.  Await patient response Results for orders placed or performed in visit on 40/37/54  Basic metabolic panel  Result Value Ref Range   Sodium 140 135 - 145 mEq/L   Potassium 4.7 3.5 - 5.1 mEq/L   Chloride 103 96 - 112 mEq/L   CO2 28 19 - 32 mEq/L   Glucose, Bld 157 (H) 70 - 99 mg/dL   BUN 16 6 - 23 mg/dL   Creatinine, Ser 0.90 0.40 - 1.20 mg/dL   GFR 63.43 >60.00 mL/min   Calcium 9.5 8.4 - 10.5 mg/dL  Hemoglobin A1c  Result Value Ref Range   Hgb A1c MFr Bld 7.8 (H) 4.6 - 6.5 %  Microalbumin /  creatinine urine ratio  Result Value Ref Range   Microalb, Ur 5.6 (H) 0.0 - 1.9 mg/dL   Creatinine,U 182.0 mg/dL   Microalb Creat Ratio 3.1 0.0 - 30.0 mg/g

## 2021-09-07 NOTE — Patient Instructions (Addendum)
It was great to see you again today, I will be in touch with your labs as soon as possible  Flu shot today Please stop by the lab- I will be in touch asap with your results Also ok to get the new covid booster asap  For your coughing/ wheezing-  Use the Fluticasone inhaler daily to prevent wheezing Use the albuterol as needed if you are coughing or wheezing  Please use the fluticasone for a month or so- let me know if this is helpful.  If it does decrease your symptoms you likely have some asthma type sx as symptom of seasonal allergies  If all is well please see me in 6 months

## 2021-09-08 NOTE — Progress Notes (Addendum)
Subjective:   Natalie Fowler is a 75 y.o. female who presents for an Initial Medicare Annual Wellness Visit.  I connected with Loretha Stapler today by telephone and verified that I am speaking with the correct person using two identifiers. Location patient: home Location provider: work Persons participating in the virtual visit: patient, Marine scientist.    I discussed the limitations, risks, security and privacy concerns of performing an evaluation and management service by telephone and the availability of in person appointments. I also discussed with the patient that there may be a patient responsible charge related to this service. The patient expressed understanding and verbally consented to this telephonic visit.    Interactive audio and video telecommunications were attempted between this provider and patient, however failed, due to patient having technical difficulties OR patient did not have access to video capability.  We continued and completed visit with audio only.  Some vital signs may be absent or patient reported.   Time Spent with patient on telephone encounter: 20 minutes   Review of Systems     Cardiac Risk Factors include: advanced age (>43men, >7 women);diabetes mellitus;dyslipidemia;obesity (BMI >30kg/m2)     Objective:    Today's Vitals   09/09/21 0900  Weight: 170 lb (77.1 kg)  Height: 5' (1.524 m)   Body mass index is 33.2 kg/m.  Advanced Directives 09/09/2021 06/03/2021 05/28/2021  Does Patient Have a Medical Advance Directive? Yes Yes Yes  Type of Paramedic of [place];Living will [place];Living will Living will  Does patient want to make changes to medical advance directive? - No - Guardian declined -  Copy of [place] in Chart? No - copy requested - -    Current Medications (verified) Outpatient Encounter Medications as of 09/09/2021  Medication Sig   acetaminophen (TYLENOL) 325 MG tablet Take 650  mg by mouth every 6 (six) hours as needed for moderate pain.   albuterol (VENTOLIN HFA) 108 (90 Base) MCG/ACT inhaler Inhale 2 puffs into the lungs every 6 (six) hours as needed for wheezing or shortness of breath.   Apoaequorin (PREVAGEN PO) Take 1 capsule by mouth daily.   cholecalciferol (VITAMIN D) 1000 units tablet Take 1,000 Units by mouth daily.   docusate sodium (COLACE) 100 MG capsule Take 1 capsule (100 mg total) by mouth 2 (two) times daily.   methocarbamol (ROBAXIN) 500 MG tablet Take 1 tablet (500 mg total) by mouth every 6 (six) hours as needed for muscle spasms.   Misc Natural Product [facility] EYE DROPS #1 OP) Place 1 drop into both eyes daily as needed (itching/irritation).   polyethylene glycol (MIRALAX / GLYCOLAX) 17 g packet Take 17 g by mouth daily as needed for mild constipation.   raloxifene (EVISTA) 60 MG tablet Take 1 tablet (60 mg total) by mouth daily.   oxyCODONE (OXY IR/ROXICODONE) 5 MG immediate release tablet Take 1-2 tablets (5-10 mg total) by mouth every 6 (six) hours as needed for severe pain. Max of 6 tabs daily (Patient not taking: Reported on 09/09/2021)   No facility-administered encounter medications on file as of 09/09/2021.    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   Procedure: TOTAL KNEE ARTHROPLASTY;  Surgeon: Paralee Cancel, MD;  Location: [facility];  Service: Orthopedics;  Laterality:  Left;   Family History  Problem Relation Age of Onset   Diabetes Mother    Hypertension Mother    Cancer Father        COLON CA   Osteoporosis Sister    Cancer Sister 88       PANCREATIC   Cancer Maternal Aunt        OVARIAN CANCER   Heart disease Maternal Aunt    Hypertension Daughter    Cancer Paternal Uncle        Colon cancer   Breast cancer Neg Hx    Social History   Socioeconomic History   Marital status: Married    Spouse name: Not on file   Number of children: Not on file   Years of education: Not on file   Highest education level: Not on file  Occupational History   Not on file  Tobacco Use   Smoking status: Never   Smokeless tobacco: Never  Vaping Use   Vaping Use: Never used  Substance and Sexual Activity   Alcohol use: No    Alcohol/week: 0.0 standard drinks   Drug use: Never   Sexual activity: Yes    Partners: Male    Birth control/protection: Post-menopausal    Comment: 1st intercourse- 24, partners- 28, married- 66 yrs   Other Topics Concern   Not on file  Social History Narrative   Not on file   Social Determinants of Health   Financial Resource Strain: Low Risk    Difficulty of Paying Living Expenses: Not hard at all  Food Insecurity: No Food Insecurity   Worried About Charity fundraiser in the Last Year: Never true   Arboriculturist in the Last Year: Never true  Transportation Needs: No Transportation Needs   Lack of Transportation (Medical): No   Lack of Transportation (Non-Medical): No  Physical Activity: Sufficiently Active   Days of Exercise per Week: 7 days   Minutes of Exercise per Session: 30 min  Stress: No Stress Concern Present   Feeling of Stress : Not at all  Social Connections: Moderately Integrated   Frequency of Communication with Friends and Family: More than three times a week   Frequency of Social Gatherings with Friends and Family: More than three times a week   Attends Religious Services: Never   Corporate treasurer or Organizations: Yes   Attends Music therapist: More than 4 times per year   Marital Status: Married    Tobacco Counseling Counseling given: Not Answered   Clinical Intake:  Pre-visit preparation completed: Yes  Pain : No/denies pain     BMI - recorded: 33.2 Nutritional Status: BMI > 30  Obese Nutritional Risks: None Diabetes: Yes CBG done?: No Did pt. bring in CBG monitor from home?: No (phone visit)  How often do you need to have someone help you when you read instructions, pamphlets, or other written materials  from your doctor or pharmacy?: 1 - Never  Diabetes:  Is the patient diabetic?  Yes  If diabetic, was a CBG obtained today?  No  Did the patient bring in their glucometer from home?  No phone visit How often do you monitor your CBG's? Never.   Financial Strains and Diabetes Management:  Are you having any financial strains with the device, your supplies or your medication? No .  Does the patient want to be seen by Chronic Care Management for management of their diabetes?  No  Would the patient like to be referred to a Nutritionist or for Diabetic Management?  No   Diabetic Exams:  Diabetic Eye Exam: Completed 10/2020-per patient.   Diabetic Foot Exam: Pt has been advised about the importance in completing this exam. To be completed by PCP.    Interpreter Needed?: No  Information entered by :: Caroleen Hamman LPN   Activities of Daily Living In your present state of health, do you have any difficulty performing the following activities: 09/09/2021 06/03/2021  Hearing? N N  Vision? N N  Difficulty concentrating or making decisions? N N  Walking or climbing stairs? N N  Dressing or bathing? N N  Doing errands, shopping? N N  Preparing Food and eating ? N -  Using the Toilet? N -  In the past six months, have you accidently leaked urine? N -  Do you have problems with loss of bowel control? N -  Managing your Medications? N -   Managing your Finances? N -  Housekeeping or managing your Housekeeping? N -  Some recent data might be hidden    Patient Care Team: Copland, Gay Filler, MD as PCP - General (Family Medicine)  Indicate any recent Medical Services you may have received from other than [facility] providers in the past year (date may be approximate).     Assessment:   This is a routine wellness examination for [place].  Hearing/Vision screen Hearing Screening - Comments:: No issues Vision Screening - Comments:: Wear glasses Last eye exam-10/2020-Dr.Freeman  Dietary issues and exercise activities discussed: Current Exercise Habits: Structured exercise class;Home exercise routine (physical therapy), Type of exercise: strength training/weights;walking, Time (Minutes): 30, Frequency (Times/Week): 7, Weekly Exercise (Minutes/Week): 210, Intensity: Mild, Exercise limited by: None identified   Goals Addressed             This Visit's Progress    Patient Stated       Increase activity as tolerated following knee replacement & lose some weight       Depression Screen PHQ 2/9 Scores 09/09/2021 05/07/2021 01/20/2021  PHQ - 2 Score 0 0 0    Fall Risk Fall Risk  09/09/2021 05/07/2021  Falls in the past year? 0 0  Number falls in past yr: 0 -  Injury with Fall? 0 -  Follow up Falls prevention discussed -    FALL RISK PREVENTION PERTAINING TO THE HOME:  Any stairs in or around the home? Yes  If so, are there any without handrails? No  Home free of loose throw rugs in walkways, pet beds, electrical cords, etc? Yes  Adequate lighting in your home to reduce risk of falls? Yes   ASSISTIVE DEVICES UTILIZED TO PREVENT FALLS:  Life alert? No  Use of a cane, walker or w/c? No  Grab bars in the bathroom? Yes  Shower chair or bench in shower? Yes  Elevated toilet seat or a handicapped toilet? No   TIMED UP AND GO:  Was the test performed? No . Phone visit   Cognitive Function:Normal cognitive status  assessed by  this Nurse Health Advisor. No abnormalities found.          Immunizations Immunization History  Administered Date(s) Administered   Fluad Quad(high Dose 65+) 09/26/2020   PFIZER(Purple Top)SARS-COV-2 Vaccination 12/29/2019, 01/23/2020, 08/25/2020   Pneumococcal Conjugate-13 08/28/2014   Pneumococcal Polysaccharide-23 09/16/2015   Td 08/28/2014   Tdap 09/21/2013   Zoster Recombinat (Shingrix) 11/07/2018, 03/08/2019    TDAP status: Up to date  Flu Vaccine status: Due, Education has been provided regarding the importance of this vaccine. Advised may receive this vaccine at local pharmacy or Health Dept. Aware to provide a copy of the vaccination record if obtained from local pharmacy or Health Dept. Verbalized acceptance and understanding.  Pneumococcal vaccine status: Up to date  Covid-19 vaccine status: Information provided on how to obtain vaccines. Booster due  Qualifies for Shingles Vaccine? No   Zostavax completed No   Shingrix Completed?: Yes  Screening Tests Health Maintenance  Topic Date Due   FOOT EXAM  Never done   OPHTHALMOLOGY EXAM  Never done   URINE MICROALBUMIN  10/01/2020   COVID-19 Vaccine (4 - Booster for Pfizer series) 12/26/2020   INFLUENZA VACCINE  06/23/2021   HEMOGLOBIN A1C  11/06/2021   MAMMOGRAM  10/03/2022   TETANUS/TDAP  08/28/2024   COLONOSCOPY (Pts 45-26yrs Insurance coverage will need to be confirmed)  09/11/2028   DEXA SCAN  Completed   Hepatitis C Screening  Completed   Zoster Vaccines- Shingrix  Completed   HPV VACCINES  Aged Out    Health Maintenance  Health Maintenance Due  Topic Date Due   FOOT EXAM  Never done   OPHTHALMOLOGY EXAM  Never done   URINE MICROALBUMIN  10/01/2020   COVID-19 Vaccine (4 - Booster for Pfizer series) 12/26/2020   INFLUENZA VACCINE  06/23/2021    Colorectal cancer screening: Type of screening: Colonoscopy. Completed 09/11/2018. Repeat every 10 years  Mammogram status: Completed  bilateral 10/03/2020. Repeat every year. Scheduled for 10/06/2021  Bone Density status: Completed 11/19/2020\. Results reflect: Bone density results: OSTEOPENIA. Repeat every 2 years.  Lung Cancer Screening: (Low Dose CT Chest recommended if Age 71-80 years, 30 pack-year currently smoking OR have quit w/in 15years.) does not qualify.     Additional Screening:  Hepatitis C Screening: Completed 11/19/2020  Vision Screening: Recommended annual ophthalmology exams for early detection of glaucoma and other disorders of the eye. Is the patient up to date with their annual eye exam?  Yes  Who is the provider or what is the name of the office in which the patient attends annual eye exams? Dr. Domingo Cocking   Dental Screening: Recommended annual dental exams for proper oral hygiene  Community Resource Referral / Chronic Care Management: CRR required this visit?  No   CCM required this visit?  No      Plan:     I have personally reviewed and noted the following in the patient's chart:   Medical and social history Use of alcohol, tobacco or illicit drugs  Current medications and supplements including opioid prescriptions. Patient is not currently taking opioid prescriptions. Functional ability and status Nutritional status Physical activity Advanced directives List of other physicians Hospitalizations, surgeries, and ER visits in previous 12 months Vitals Screenings to include cognitive, depression, and falls Referrals and appointments  In addition, I have reviewed and discussed with patient certain preventive protocols, quality metrics, and best practice recommendations. A written  personalized care plan for preventive services as well as general preventive health recommendations were provided to patient.   Due to this being a telephonic visit, the after visit summary with patients personalized plan was offered to patient via mail or my-chart. Patient would like to access on  my-chart.   Marta Antu, LPN   [id number]  Nurse Health Advisor  Nurse Notes: None   Medical screening examination/treatment/procedure(s) were performed by non-physician practitioner and as supervising provider I was immediately available for consultation/collaboration.  I agree with above. Marrian Salvage, FNP

## 2021-09-09 ENCOUNTER — Ambulatory Visit (INDEPENDENT_AMBULATORY_CARE_PROVIDER_SITE_OTHER): Payer: PPO

## 2021-09-09 VITALS — Ht 60.0 in | Wt 170.0 lb

## 2021-09-09 DIAGNOSIS — Z0000 Encounter for general adult medical examination without abnormal findings: Secondary | ICD-10-CM

## 2021-09-09 NOTE — Patient Instructions (Signed)
Natalie Fowler , Thank you for taking time to complete your Medicare Wellness Visit. I appreciate your ongoing commitment to your health goals. Please review the following plan we discussed and let me know if I can assist you in the future.   Screening recommendations/referrals: Colonoscopy: Completed-09/12/2018-Due-09/13/2023 Mammogram: Scheduled for 10/06/2021 Bone Density: Completed 11/19/2020-Due 11/19/2022 Recommended yearly ophthalmology/optometry visit for glaucoma screening and checkup Recommended yearly dental visit for hygiene and checkup  Vaccinations: Influenza vaccine: Due-May obtain vaccine at our office or your local pharmacy. Pneumococcal vaccine: Up to date Tdap vaccine: Up to date-Due-08/28/2024 Shingles vaccine: Completed vaccines   Covid-19:Booster available at the pharmacy.  Advanced directives: Please bring a copy of Living Will and/or Healthcare Power of Attorney for your chart.   Conditions/risks identified: See problem list  Next appointment: Follow up in one year for your annual wellness visit 09/14/2022 @ 9:00.   Preventive Care 75 Years and Older, Female Preventive care refers to lifestyle choices and visits with your health care provider that can promote health and wellness. What does preventive care include? A yearly physical exam. This is also called an annual well check. Dental exams once or twice a year. Routine eye exams. Ask your health care provider how often you should have your eyes checked. Personal lifestyle choices, including: Daily care of your teeth and gums. Regular physical activity. Eating a healthy diet. Avoiding tobacco and drug use. Limiting alcohol use. Practicing safe sex. Taking low-dose aspirin every day. Taking vitamin and mineral supplements as recommended by your health care provider. What happens during an annual well check? The services and screenings done by your health care provider during your annual well check will  depend on your age, overall health, lifestyle risk factors, and family history of disease. Counseling  Your health care provider may ask you questions about your: Alcohol use. Tobacco use. Drug use. Emotional well-being. Home and relationship well-being. Sexual activity. Eating habits. History of falls. Memory and ability to understand (cognition). Work and work Statistician. Reproductive health. Screening  You may have the following tests or measurements: Height, weight, and BMI. Blood pressure. Lipid and cholesterol levels. These may be checked every 5 years, or more frequently if you are over 51 years old. Skin check. Lung cancer screening. You may have this screening every year starting at age 82 if you have a 30-pack-year history of smoking and currently smoke or have quit within the past 15 years. Fecal occult blood test (FOBT) of the stool. You may have this test every year starting at age 7. Flexible sigmoidoscopy or colonoscopy. You may have a sigmoidoscopy every 5 years or a colonoscopy every 10 years starting at age 47. Hepatitis C blood test. Hepatitis B blood test. Sexually transmitted disease (STD) testing. Diabetes screening. This is done by checking your blood sugar (glucose) after you have not eaten for a while (fasting). You may have this done every 1-3 years. Bone density scan. This is done to screen for osteoporosis. You may have this done starting at age 62. Mammogram. This may be done every 1-2 years. Talk to your health care provider about how often you should have regular mammograms. Talk with your health care provider about your test results, treatment options, and if necessary, the need for more tests. Vaccines  Your health care provider may recommend certain vaccines, such as: Influenza vaccine. This is recommended every year. Tetanus, diphtheria, and acellular pertussis (Tdap, Td) vaccine. You may need a Td booster every 10 years. Zoster vaccine. You may  need this after age 29. Pneumococcal 13-valent conjugate (PCV13) vaccine. One dose is recommended after age 16. Pneumococcal polysaccharide (PPSV23) vaccine. One dose is recommended after age 37. Talk to your health care provider about which screenings and vaccines you need and how often you need them. This information is not intended to replace advice given to you by your health care provider. Make sure you discuss any questions you have with your health care provider. Document Released: 12/06/2015 Document Revised: 07/29/2016 Document Reviewed: 09/10/2015 Elsevier Interactive Patient Education  2017 [place] Prevention in the Home Falls can cause injuries. They can happen to people of all ages. There are many things you can do to make your home safe and to help prevent falls. What can I do on the outside of my home? Regularly fix the edges of walkways and driveways and fix any cracks. Remove anything that might make you trip as you walk through a door, such as a raised step or threshold. Trim any bushes or trees on the path to your home. Use bright outdoor lighting. Clear any walking paths of anything that might make someone trip, such as rocks or tools. Regularly check to see if handrails are loose or broken. Make sure that both sides of any steps have handrails. Any raised decks and porches should have guardrails on the edges. Have any leaves, snow, or ice cleared regularly. Use sand or salt on walking paths during winter. Clean up any spills in your garage right away. This includes oil or grease spills. What can I do in the bathroom? Use night lights. Install grab bars by the toilet and in the tub and shower. Do not use towel bars as grab bars. Use non-skid mats or decals in the tub or shower. If you need to sit down in the shower, use a plastic, non-slip stool. Keep the floor dry. Clean up any water that spills on the floor as soon as it happens. Remove soap buildup in  the tub or shower regularly. Attach bath mats securely with double-sided non-slip rug tape. Do not have throw rugs and other things on the floor that can make you trip. What can I do in the bedroom? Use night lights. Make sure that you have a light by your bed that is easy to reach. Do not use any sheets or blankets that are too big for your bed. They should not hang down onto the floor. Have a firm chair that has side arms. You can use this for support while you get dressed. Do not have throw rugs and other things on the floor that can make you trip. What can I do in the kitchen? Clean up any spills right away. Avoid walking on wet floors. Keep items that you use a lot in easy-to-reach places. If you need to reach something above you, use a strong step stool that has a grab bar. Keep electrical cords out of the way. Do not use floor polish or wax that makes floors slippery. If you must use wax, use non-skid floor wax. Do not have throw rugs and other things on the floor that can make you trip. What can I do with my stairs? Do not leave any items on the stairs. Make sure that there are handrails on both sides of the stairs and use them. Fix handrails that are broken or loose. Make sure that handrails are as long as the stairways. Check any carpeting to make sure that it is firmly attached to  the stairs. Fix any carpet that is loose or worn. Avoid having throw rugs at the top or bottom of the stairs. If you do have throw rugs, attach them to the floor with carpet tape. Make sure that you have a light switch at the top of the stairs and the bottom of the stairs. If you do not have them, ask someone to add them for you. What else can I do to help prevent falls? Wear shoes that: Do not have high heels. Have rubber bottoms. Are comfortable and fit you well. Are closed at the toe. Do not wear sandals. If you use a stepladder: Make sure that it is fully opened. Do not climb a closed  stepladder. Make sure that both sides of the stepladder are locked into place. Ask someone to hold it for you, if possible. Clearly mark and make sure that you can see: Any grab bars or handrails. First and last steps. Where the edge of each step is. Use tools that help you move around (mobility aids) if they are needed. These include: Canes. Walkers. Scooters. Crutches. Turn on the lights when you go into a dark area. Replace any light bulbs as soon as they burn out. Set up your furniture so you have a clear path. Avoid moving your furniture around. If any of your floors are uneven, fix them. If there are any pets around you, be aware of where they are. Review your medicines with your doctor. Some medicines can make you feel dizzy. This can increase your chance of falling. Ask your doctor what other things that you can do to help prevent falls. This information is not intended to replace advice given to you by your health care provider. Make sure you discuss any questions you have with your health care provider. Document Released: 09/05/2009 Document Revised: 04/16/2016 Document Reviewed: 12/14/2014 Elsevier Interactive Patient Education  2017 [employer].

## 2021-09-10 DIAGNOSIS — Z96652 Presence of left artificial knee joint: Secondary | ICD-10-CM | POA: Diagnosis not present

## 2021-09-10 DIAGNOSIS — Z4789 Encounter for other orthopedic aftercare: Secondary | ICD-10-CM | POA: Diagnosis not present

## 2021-09-10 DIAGNOSIS — R262 Difficulty in walking, not elsewhere classified: Secondary | ICD-10-CM | POA: Diagnosis not present

## 2021-09-10 DIAGNOSIS — M25562 Pain in left knee: Secondary | ICD-10-CM | POA: Diagnosis not present

## 2021-09-10 DIAGNOSIS — R531 Weakness: Secondary | ICD-10-CM | POA: Diagnosis not present

## 2021-09-10 DIAGNOSIS — R600 Localized edema: Secondary | ICD-10-CM | POA: Diagnosis not present

## 2021-09-11 ENCOUNTER — Encounter: Payer: Self-pay | Admitting: Family Medicine

## 2021-09-11 ENCOUNTER — Ambulatory Visit (HOSPITAL_BASED_OUTPATIENT_CLINIC_OR_DEPARTMENT_OTHER)
Admission: RE | Admit: 2021-09-11 | Discharge: 2021-09-11 | Disposition: A | Discharge status: Home / Self Care | Payer: PPO | Source: Ambulatory Visit | Attending: Family Medicine | Admitting: Family Medicine

## 2021-09-11 ENCOUNTER — Other Ambulatory Visit: Payer: Self-pay

## 2021-09-11 ENCOUNTER — Ambulatory Visit (INDEPENDENT_AMBULATORY_CARE_PROVIDER_SITE_OTHER): Payer: PPO | Admitting: Family Medicine

## 2021-09-11 VITALS — BP 122/80 | HR 90 | Temp 97.8°F | Resp 18 | Ht 60.0 in | Wt 173.6 lb

## 2021-09-11 DIAGNOSIS — R059 Cough, unspecified: Secondary | ICD-10-CM | POA: Diagnosis not present

## 2021-09-11 DIAGNOSIS — Z23 Encounter for immunization: Secondary | ICD-10-CM

## 2021-09-11 DIAGNOSIS — R808 Other proteinuria: Secondary | ICD-10-CM

## 2021-09-11 DIAGNOSIS — R062 Wheezing: Secondary | ICD-10-CM | POA: Diagnosis not present

## 2021-09-11 DIAGNOSIS — E119 Type 2 diabetes mellitus without complications: Secondary | ICD-10-CM | POA: Diagnosis not present

## 2021-09-11 DIAGNOSIS — E785 Hyperlipidemia, unspecified: Secondary | ICD-10-CM | POA: Diagnosis not present

## 2021-09-11 LAB — BASIC METABOLIC PANEL
BUN: 16 mg/dL (ref 6–23)
CO2: 28 mEq/L (ref 19–32)
Calcium: 9.5 mg/dL (ref 8.4–10.5)
Chloride: 103 mEq/L (ref 96–112)
Creatinine, Ser: 0.9 mg/dL (ref 0.40–1.20)
GFR: 63.43 mL/min (ref >60.00)
Glucose, Bld: 157 mg/dL — ABNORMAL HIGH (ref 70–99)
Potassium: 4.7 mEq/L (ref 3.5–5.1)
Sodium: 140 mEq/L (ref 135–145)

## 2021-09-11 LAB — MICROALBUMIN / CREATININE URINE RATIO
Creatinine,U: 182 mg/dL
Microalb Creat Ratio: 3.1 mg/g (ref 0.0–30.0)
Microalb, Ur: 5.6 mg/dL — ABNORMAL HIGH (ref 0.0–1.9)

## 2021-09-11 LAB — HEMOGLOBIN A1C: Hgb A1c MFr Bld: 7.8 % — ABNORMAL HIGH (ref 4.6–6.5)

## 2021-09-11 MED ORDER — ARNUITY ELLIPTA 100 MCG/ACT IN AEPB: 1.0000 | INHALATION_SPRAY | Freq: Every day | RESPIRATORY_TRACT | 3 refills | Status: DC

## 2021-09-11 MED ORDER — ALBUTEROL SULFATE HFA 108 (90 BASE) MCG/ACT IN AERS: 2.0000 | INHALATION_SPRAY | Freq: Four times a day (QID) | RESPIRATORY_TRACT | 2 refills | Status: DC | PRN

## 2021-09-12 MED ORDER — LISINOPRIL 5 MG PO TABS: 5.0000 mg | ORAL_TABLET | Freq: Every day | ORAL | 3 refills | Status: DC

## 2021-09-12 MED ORDER — METFORMIN HCL 500 MG PO TABS: 500.0000 mg | ORAL_TABLET | Freq: Every day | ORAL | 3 refills | Status: DC

## 2021-09-15 ENCOUNTER — Ambulatory Visit: Payer: PPO | Admitting: Family Medicine

## 2021-09-17 DIAGNOSIS — R531 Weakness: Secondary | ICD-10-CM | POA: Diagnosis not present

## 2021-09-17 DIAGNOSIS — Z4789 Encounter for other orthopedic aftercare: Secondary | ICD-10-CM | POA: Diagnosis not present

## 2021-09-17 DIAGNOSIS — M25562 Pain in left knee: Secondary | ICD-10-CM | POA: Diagnosis not present

## 2021-09-17 DIAGNOSIS — R262 Difficulty in walking, not elsewhere classified: Secondary | ICD-10-CM | POA: Diagnosis not present

## 2021-09-17 DIAGNOSIS — Z96652 Presence of left artificial knee joint: Secondary | ICD-10-CM | POA: Diagnosis not present

## 2021-09-17 DIAGNOSIS — R600 Localized edema: Secondary | ICD-10-CM | POA: Diagnosis not present

## 2021-09-24 DIAGNOSIS — Z96652 Presence of left artificial knee joint: Secondary | ICD-10-CM | POA: Diagnosis not present

## 2021-09-24 DIAGNOSIS — R262 Difficulty in walking, not elsewhere classified: Secondary | ICD-10-CM | POA: Diagnosis not present

## 2021-09-24 DIAGNOSIS — R600 Localized edema: Secondary | ICD-10-CM | POA: Diagnosis not present

## 2021-09-24 DIAGNOSIS — M25562 Pain in left knee: Secondary | ICD-10-CM | POA: Diagnosis not present

## 2021-09-24 DIAGNOSIS — Z4789 Encounter for other orthopedic aftercare: Secondary | ICD-10-CM | POA: Diagnosis not present

## 2021-09-24 DIAGNOSIS — R531 Weakness: Secondary | ICD-10-CM | POA: Diagnosis not present

## 2021-09-26 ENCOUNTER — Encounter: Payer: Self-pay | Admitting: Family Medicine

## 2021-10-06 ENCOUNTER — Ambulatory Visit
Admission: RE | Admit: 2021-10-06 | Discharge: 2021-10-06 | Disposition: A | Discharge status: Home / Self Care | Payer: PPO | Source: Ambulatory Visit | Attending: Family Medicine | Admitting: Family Medicine

## 2021-10-06 DIAGNOSIS — Z1231 Encounter for screening mammogram for malignant neoplasm of breast: Secondary | ICD-10-CM

## 2021-10-28 DIAGNOSIS — N3010 Interstitial cystitis (chronic) without hematuria: Secondary | ICD-10-CM | POA: Diagnosis not present

## 2021-10-31 ENCOUNTER — Ambulatory Visit (INDEPENDENT_AMBULATORY_CARE_PROVIDER_SITE_OTHER): Payer: PPO | Admitting: Obstetrics & Gynecology

## 2021-10-31 ENCOUNTER — Encounter: Payer: Self-pay | Admitting: Obstetrics & Gynecology

## 2021-10-31 ENCOUNTER — Other Ambulatory Visit: Payer: Self-pay

## 2021-10-31 VITALS — BP 114/80 | HR 78 | Ht 60.25 in | Wt 169.0 lb

## 2021-10-31 DIAGNOSIS — M810 Age-related osteoporosis without current pathological fracture: Secondary | ICD-10-CM

## 2021-10-31 DIAGNOSIS — Z6832 Body mass index (BMI) 32.0-32.9, adult: Secondary | ICD-10-CM

## 2021-10-31 DIAGNOSIS — Z9189 Other specified personal risk factors, not elsewhere classified: Secondary | ICD-10-CM

## 2021-10-31 DIAGNOSIS — M8589 Other specified disorders of bone density and structure, multiple sites: Secondary | ICD-10-CM

## 2021-10-31 DIAGNOSIS — Z01419 Encounter for gynecological examination (general) (routine) without abnormal findings: Secondary | ICD-10-CM

## 2021-10-31 DIAGNOSIS — Z780 Asymptomatic menopausal state: Secondary | ICD-10-CM

## 2021-10-31 DIAGNOSIS — E6609 Other obesity due to excess calories: Secondary | ICD-10-CM

## 2021-10-31 NOTE — Progress Notes (Signed)
Natalie Fowler 1946/06/13 [id number]   History:    75 y.o.  G2P2L2 Married   RP:  Established patient presenting for annual gyn exam    HPI:  Postmenopause, well on no hormone replacement therapy.  No postmenopausal bleeding.  No pelvic pain.  Occasionally sexually active with no problem.  Pap Neg 10/2020. Urine normal, no stress urinary incontinence.  Followed by urologist for history of renal cyst. Bowel movements normal.  Breasts normal.  Last mammogram November 2022 was negative.  Body mass index increased to 32.73. H/O Rt knee torn meniscus repaired.  Lt knee replacement 5 months ago.  Doing PT.  Good nutrition with vegetables and fruits.  Health labs with family physician.  Colono 02/2021.  BD 10/2020 Osteopenia, T-Score -1.9 at [facility].  Continue Evista, prescribed by Fam MD Dr Lorelei Pont.   Past medical history,surgical history, family history and social history were all reviewed and documented in the EPIC chart.  Gynecologic History No LMP recorded. Patient is postmenopausal.  Obstetric History OB History  Gravida Para Term Preterm AB Living  3 2     1 2   SAB IAB Ectopic Multiple Live Births  1   0        # Outcome Date GA Lbr Len/2nd Weight Sex Delivery Anes PTL Lv  3 SAB           2 Para           1 Para              ROS: A ROS was performed and pertinent positives and negatives are included in the history.  GENERAL: No fevers or chills. HEENT: No change in vision, no earache, sore throat or sinus congestion. NECK: No pain or stiffness. CARDIOVASCULAR: No chest pain or pressure. No palpitations. PULMONARY: No shortness of breath, cough or wheeze. GASTROINTESTINAL: No abdominal pain, nausea, vomiting or diarrhea, melena or bright red blood per rectum. GENITOURINARY: No urinary frequency, urgency, hesitancy or dysuria. MUSCULOSKELETAL: No joint or muscle pain, no back pain, no recent trauma. DERMATOLOGIC: No rash, no itching, no lesions. ENDOCRINE: No polyuria, polydipsia, no  heat or cold intolerance. No recent change in weight. HEMATOLOGICAL: No anemia or easy bruising or bleeding. NEUROLOGIC: No headache, seizures, numbness, tingling or weakness. PSYCHIATRIC: No depression, no loss of interest in normal activity or change in sleep pattern.     Exam:   BP 114/80   Pulse 78   Ht 5' 0.25" (1.53 m)   Wt 169 lb (76.7 kg)   BMI 32.73 kg/m   Body mass index is 32.73 kg/m.  General appearance : Well developed well nourished female. No acute distress HEENT: Eyes: no retinal hemorrhage or exudates,  Neck supple, trachea midline, no carotid bruits, no thyroidmegaly Lungs: Clear to auscultation, no rhonchi or wheezes, or rib retractions  Heart: Regular rate and rhythm, no murmurs or gallops Breast:Examined in sitting and supine position were symmetrical in appearance, no palpable masses or tenderness,  no skin retraction, no nipple inversion, no nipple discharge, no skin discoloration, no axillary or supraclavicular lymphadenopathy Abdomen: no palpable masses or tenderness, no rebound or guarding Extremities: no edema or skin discoloration or tenderness  Pelvic: Vulva: Normal             Vagina: No gross lesions or discharge  Cervix: No gross lesions or discharge  Uterus  AV, normal size, shape and consistency, non-tender and mobile  Adnexa  Without masses or tenderness  Anus: Normal  Assessment/Plan:  75 y.o. female for annual exam   1. Well female exam with routine gynecological exam Postmenopause, well on no hormone replacement therapy.  No postmenopausal bleeding.  No pelvic pain.  Occasionally sexually active with no problem.  Pap Neg 10/2020. Urine normal, no stress urinary incontinence.  Followed by urologist for history of renal cyst. Bowel movements normal.  Breasts normal.  Last mammogram November 2022 was negative.  Body mass index increased to 32.73. H/O Rt knee torn meniscus repaired.  Lt knee replacement 5 months ago.  Doing PT.  Good nutrition  with vegetables and fruits.  Health labs with family physician.  Colono 02/2021.  BD 10/2020 Osteopenia, T-Score -1.9 at [facility].  Continue Evista, prescribed by Fam MD Dr Lorelei Pont.  2. At risk of fracture due to osteopenia  3. Postmenopausal Postmenopause, well on no hormone replacement therapy.  No postmenopausal bleeding.  No pelvic pain.  Occasionally sexually active with no problem.   4. Osteopenia of multiple sites BD 10/2020 Osteopenia, T-Score -1.9 at [facility].  Continue Evista, prescribed by Fam MD Dr Lorelei Pont.  Vit D supplements, Ca++ 1.5 g/d total, regular wt bearing physical activities.  5. Class 1 obesity due to excess calories with serious comorbidity and body mass index (BMI) of 32.0 to 32.9 in adult  Low calorie/carb diet.  Continue with regular physical activities.  Princess Bruins MD, 8:13 AM 10/31/2021

## 2021-11-04 NOTE — Progress Notes (Addendum)
[facility] [address], [place], [place] [address] [phone]  Date:  11/06/2021   Name:  Natalie Fowler   DOB:  March 18, 1946   MRN:  [id number]  PCP:  Darreld Mclean, MD    Chief Complaint: Cough (Worse in the afternoons and at night. ) and possible UTI (Sxs started Monday: frequency)   History of Present Illness:  Natalie Fowler is a 75 y.o. very pleasant female patient who presents with the following:  Pt seen today with concern of a cough  Last seen by myself in October for routine follow-up  History of diet controlled diabetes mellitus, vaginal atrophy, osteopenia, vitamin D deficiency, family history of colon cancer, dyslipidemia  At our last visit she mentioned concern of a wheezing cough at night only for a month. We had her try an inhaled steroid and prn albuterol -unfortunately, she reports these were not helpful  She did not get any better She has not been sick with any other symptoms- just a chronic cough which she notes mostly at night or when she is supine; however, she states no difficulty breathing The cough is occasionally productive  She may sometimes have a scratchy throat Over this past weekend the cough was very severe- she was coughing so hard and her face got very red and her husband was concerned She may wheeze at times also  Never a smoker Normal chest film back in October She does not feel like she is having any GI reflux -however, she does have history of reflux in the past  She also notes possible sx of a UTI over the last few days- urinary frequency and had pain over her bladder No hematuria  No abdominal pain, nausea or vomiting  Covid booster  Flu shot done   We started lisinopril in October but she was already coughing then   Lab Results  Component Value Date   HGBA1C 7.8 (H) 09/11/2021    Patient Active Problem List   Diagnosis Date Noted   S/P total knee  arthroplasty, 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   Dyslipidemia 05/02/2021   Diet-controlled diabetes mellitus ([place]) 01/20/2021   Low back pain 11/19/2016   History of vitamin D deficiency 09/30/2016   H/O colonoscopy with polypectomy 09/30/2015   Vaginal atrophy 09/25/2014   Vertigo    Osteopenia    Acid reflux     Past Medical History:  Diagnosis Date   Acid reflux    Arthritis    [name] tumor    benign   Osteopenia    Ovarian cyst, bilateral    Pneumonia    PONV (postoperative nausea and vomiting)    Pre-diabetes    Vertigo    Vitamin D deficiency     Past Surgical History:  Procedure Laterality Date   APPENDECTOMY     CHOLECYSTECTOMY     CYST EXCISION     CHEST    JOINT REPLACEMENT     OOPHORECTOMY  10/23/2006   DIAG LAP WITH BSO   PELVIC LAPAROSCOPY  10/23/2006   DIAG LAP WITH BSO   ROTATOR CUFF REPAIR     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   Procedure: TOTAL KNEE ARTHROPLASTY;  Surgeon: Paralee Cancel, MD;  Location: [facility];  Service: Orthopedics;  Laterality: Left;    Social History   Tobacco Use   Smoking status: Never   Smokeless tobacco: Never  Vaping Use   Vaping Use: Never used  Substance Use  Topics   Alcohol use: No    Alcohol/week: 0.0 standard drinks   Drug use: Never    Family History  Problem Relation Age of Onset   Diabetes Mother    Hypertension Mother    Cancer Father        COLON CA   Osteoporosis Sister    Cancer Sister 62       PANCREATIC   Cancer Maternal Aunt        OVARIAN CANCER   Heart disease Maternal Aunt    Hypertension Daughter    Cancer Paternal Uncle        Colon cancer   Breast cancer Neg Hx     Allergies  Allergen Reactions   Vioxx [Rofecoxib] Rash    Medication list has been reviewed and updated.  Current Outpatient Medications on File Prior to Visit  Medication Sig Dispense Refill   acetaminophen (TYLENOL) 325 MG tablet Take 650 mg by mouth every 6 (six) hours as needed for moderate pain.     albuterol  (VENTOLIN HFA) 108 (90 Base) MCG/ACT inhaler Inhale 2 puffs into the lungs every 6 (six) hours as needed for wheezing or shortness of breath. 8 g 2   cholecalciferol (VITAMIN D) 1000 units tablet Take 1,000 Units by mouth daily.     docusate sodium (COLACE) 100 MG capsule Take 1 capsule (100 mg total) by mouth 2 (two) times daily. 10 capsule 0   Fluticasone Furoate (ARNUITY ELLIPTA) 100 MCG/ACT AEPB Inhale 1-2 puffs into the lungs daily. 1 each 3   lisinopril (ZESTRIL) 5 MG tablet Take 1 tablet (5 mg total) by mouth daily. 90 tablet 3   metFORMIN (GLUCOPHAGE) 500 MG tablet Take 1 tablet (500 mg total) by mouth daily. 90 tablet 3   raloxifene (EVISTA) 60 MG tablet Take 1 tablet (60 mg total) by mouth daily. 90 tablet 3   No current facility-administered medications on file prior to visit.    Review of Systems:  As per HPI- otherwise negative.  Pulse Readings from Last 3 Encounters:  11/06/21 90  10/31/21 78  09/11/21 90   Wt Readings from Last 3 Encounters:  11/06/21 168 lb (76.2 kg)  10/31/21 169 lb (76.7 kg)  09/11/21 173 lb 9.6 oz (78.7 kg)     Physical Examination: Vitals:   11/06/21 1526 11/06/21 1550  BP: 122/80   Pulse: (!) 102 90  Resp: 18   Temp: 97.9 F (36.6 C)   SpO2: 97%    Vitals:   11/06/21 1526  Weight: 168 lb (76.2 kg)   Body mass index is 32.54 kg/m. Ideal Body Weight:    GEN: no acute distress.  Obese, otherwise looks well HEENT: Atraumatic, Normocephalic.  Bilateral TM wnl, oropharynx normal.  PEERL,EOMI.   Ears and Nose: No external deformity. CV: RRR, No M/G/R. No JVD. No thrill. No extra heart sounds. PULM: CTA B, no wheezes, crackles, rhonchi. No retractions. No resp. distress. No accessory muscle use. ABD: S, NT, ND No rebound. No HSM. EXTR: No c/c/e PSYCH: Normally interactive. Conversant.  Abdomen is benign, patient did not cough during exam today  Results for orders placed or performed in visit on 11/06/21  POCT urinalysis dipstick   Result Value Ref Range   Color, UA yellow yellow   Clarity, UA clear clear   Glucose, UA negative negative mg/dL   Bilirubin, UA negative negative   Ketones, POC UA negative negative mg/dL   Spec Grav, UA 1.025 1.010 - 1.025  Blood, UA moderate (A) negative   pH, UA 5.0 5.0 - 8.0   Protein Ur, POC negative negative mg/dL   Urobilinogen, UA 0.2 0.2 or 1.0 E.U./dL   Nitrite, UA Negative Negative   Leukocytes, UA Small (1+) (A) Negative    Assessment and Plan: Urinary frequency - Plan: Urine Culture, POCT urinalysis dipstick, amoxicillin-clavulanate (AUGMENTIN) 500-125 MG tablet  Subacute cough - Plan: HYDROcodone bit-homatropine (HYCODAN) 5-1.5 MG/5ML syrup, benzonatate (TESSALON PERLES) 100 MG capsule, amoxicillin-clavulanate (AUGMENTIN) 500-125 MG tablet, pantoprazole (PROTONIX) 40 MG tablet  Microhematuria - Plan: Urine Microscopic Only  Patient seen today with concern of cough for about 3 months and also possible urinary tract infection.  Urine is being sent for culture, urine micro done as well for microhematuria  We will treat with Augmentin as this would also cover potential lung infection  Subacute cough, discussed wide range of possible etiologies with patient today.  She does not feel ill, inhaled steroid was not helpful.  Her weight is stable to decreased, no orthopnea so doubt CHF.  Reflux is a possible etiology.  Patient is somewhat hesitant to use PPI due to history of osteopenia.  She is willing to do a 2-week trial of a PPI, called in Protonix.  We will also have her try Ladona Ridgel, she may use Hycodan to help with sleeping and nighttime cough which keeps her from sleep.  Advised her this can cause sedation  She will keep me updated about her progress  Signed Lamar Blinks, MD Addendum 12/18 Received her urine culture as below-message to patient Results for orders placed or performed in visit on 11/06/21  Urine Culture   Specimen: Urine  Result Value Ref  Range   MICRO NUMBER: [id number]    SPECIMEN QUALITY: Adequate    Sample Source NOT GIVEN    STATUS: FINAL    ISOLATE 1: Escherichia coli (A)       Susceptibility   Escherichia coli - URINE CULTURE, REFLEX    AMOX/CLAVULANIC <=2 Sensitive     AMPICILLIN 4 Sensitive     AMPICILLIN/SULBACTAM <=2 Sensitive     CEFAZOLIN* <=4 Not Reportable      * For infections other than uncomplicated UTI caused by E. coli, K. pneumoniae or P. mirabilis: Cefazolin is resistant if MIC > or = 8 mcg/mL. (Distinguishing susceptible versus intermediate for isolates with MIC < or = 4 mcg/mL requires additional testing.) For uncomplicated UTI caused by E. coli, K. pneumoniae or P. mirabilis: Cefazolin is susceptible if MIC <32 mcg/mL and predicts susceptible to the oral agents cefaclor, cefdinir, cefpodoxime, cefprozil, cefuroxime, cephalexin and loracarbef.     CEFTAZIDIME <=1 Sensitive     CEFEPIME <=1 Sensitive     CEFTRIAXONE <=1 Sensitive     CIPROFLOXACIN <=0.25 Sensitive     LEVOFLOXACIN <=0.12 Sensitive     GENTAMICIN <=1 Sensitive     IMIPENEM <=0.25 Sensitive     NITROFURANTOIN <=16 Sensitive     PIP/TAZO <=4 Sensitive     TOBRAMYCIN <=1 Sensitive     TRIMETH/SULFA* <=20 Sensitive      * For infections other than uncomplicated UTI caused by E. coli, K. pneumoniae or P. mirabilis: Cefazolin is resistant if MIC > or = 8 mcg/mL. (Distinguishing susceptible versus intermediate for isolates with MIC < or = 4 mcg/mL requires additional testing.) For uncomplicated UTI caused by E. coli, K. pneumoniae or P. mirabilis: Cefazolin is susceptible if MIC <32 mcg/mL and predicts susceptible to the oral agents cefaclor, cefdinir, cefpodoxime,  cefprozil, cefuroxime, cephalexin and loracarbef. Legend: S = Susceptible  I = Intermediate R = Resistant  NS = Not susceptible * = Not tested  NR = Not reported **NN = See antimicrobic comments   POCT urinalysis dipstick  Result Value Ref Range   Color,  UA yellow yellow   Clarity, UA clear clear   Glucose, UA negative negative mg/dL   Bilirubin, UA negative negative   Ketones, POC UA negative negative mg/dL   Spec Grav, UA 1.025 1.010 - 1.025   Blood, UA moderate (A) negative   pH, UA 5.0 5.0 - 8.0   Protein Ur, POC negative negative mg/dL   Urobilinogen, UA 0.2 0.2 or 1.0 E.U./dL   Nitrite, UA Negative Negative   Leukocytes, UA Small (1+) (A) Negative

## 2021-11-06 ENCOUNTER — Ambulatory Visit (INDEPENDENT_AMBULATORY_CARE_PROVIDER_SITE_OTHER): Payer: PPO | Admitting: Family Medicine

## 2021-11-06 VITALS — BP 122/80 | HR 90 | Temp 97.9°F | Resp 18 | Wt 168.0 lb

## 2021-11-06 DIAGNOSIS — R350 Frequency of micturition: Secondary | ICD-10-CM | POA: Diagnosis not present

## 2021-11-06 DIAGNOSIS — R3129 Other microscopic hematuria: Secondary | ICD-10-CM

## 2021-11-06 DIAGNOSIS — R052 Subacute cough: Secondary | ICD-10-CM

## 2021-11-06 LAB — POCT URINALYSIS DIP (MANUAL ENTRY)
Bilirubin, UA: NEGATIVE
Glucose, UA: NEGATIVE mg/dL
Ketones, POC UA: NEGATIVE mg/dL
Nitrite, UA: NEGATIVE
Protein Ur, POC: NEGATIVE mg/dL
Spec Grav, UA: 1.025 (ref 1.010–1.025)
Urobilinogen, UA: 0.2 E.U./dL
pH, UA: 5 (ref 5.0–8.0)

## 2021-11-06 MED ORDER — AMOXICILLIN-POT CLAVULANATE 500-125 MG PO TABS
1.0000 | ORAL_TABLET | Freq: Two times a day (BID) | ORAL | 0 refills | Status: DC
Start: 2021-11-06 — End: 2022-03-10

## 2021-11-06 MED ORDER — HYDROCODONE BIT-HOMATROP MBR 5-1.5 MG/5ML PO SOLN: 5.0000 mL | Freq: Three times a day (TID) | ORAL | 0 refills | Status: AC | PRN

## 2021-11-06 MED ORDER — PANTOPRAZOLE SODIUM 40 MG PO TBEC: 40.0000 mg | DELAYED_RELEASE_TABLET | Freq: Every day | ORAL | 0 refills | Status: DC

## 2021-11-06 MED ORDER — BENZONATATE 100 MG PO CAPS: 100.0000 mg | ORAL_CAPSULE | Freq: Three times a day (TID) | ORAL | 0 refills | Status: DC | PRN

## 2021-11-06 NOTE — Patient Instructions (Addendum)
Good to see you again today We will check your urine culture and be back in touch asap For cough- ok to use cough syrup at night, but watch out for sedation Tessalon perles as needed Let's try using an acid reducer- pantoprazole- for about 2 weeks Augmentin for urine infection and any possible lung infection

## 2021-11-07 ENCOUNTER — Telehealth: Payer: Self-pay | Admitting: Family Medicine

## 2021-11-07 ENCOUNTER — Encounter: Payer: Self-pay | Admitting: Family Medicine

## 2021-11-07 DIAGNOSIS — R808 Other proteinuria: Secondary | ICD-10-CM

## 2021-11-07 NOTE — Telephone Encounter (Signed)
Pharmacist called in regards of the patient's cough, stating that while patient was there to pick up medications she seemed upset because she has been taking medication and her cough has not gotten any better. Pharmacist noticed that pt was on Lisonopril and that medication is known for giving people a dry cough. She wanted to see if Dr. Lorelei Pont could do something about it since patient was crying and upset while there. Please advice.

## 2021-11-07 NOTE — Telephone Encounter (Signed)
Please see below.

## 2021-11-08 LAB — URINE CULTURE
MICRO NUMBER:: 12762077
SPECIMEN QUALITY:: ADEQUATE

## 2021-11-09 ENCOUNTER — Encounter: Payer: Self-pay | Admitting: Family Medicine

## 2021-12-10 DIAGNOSIS — H43813 Vitreous degeneration, bilateral: Secondary | ICD-10-CM | POA: Diagnosis not present

## 2021-12-10 DIAGNOSIS — E113292 Type 2 diabetes mellitus with mild nonproliferative diabetic retinopathy without macular edema, left eye: Secondary | ICD-10-CM | POA: Diagnosis not present

## 2021-12-10 DIAGNOSIS — H1045 Other chronic allergic conjunctivitis: Secondary | ICD-10-CM | POA: Diagnosis not present

## 2021-12-10 DIAGNOSIS — H2513 Age-related nuclear cataract, bilateral: Secondary | ICD-10-CM | POA: Diagnosis not present

## 2021-12-11 DIAGNOSIS — M25562 Pain in left knee: Secondary | ICD-10-CM | POA: Diagnosis not present

## 2021-12-11 DIAGNOSIS — Z96652 Presence of left artificial knee joint: Secondary | ICD-10-CM | POA: Diagnosis not present

## 2022-01-15 ENCOUNTER — Ambulatory Visit: Payer: PPO | Admitting: Family Medicine

## 2022-01-16 DIAGNOSIS — M25562 Pain in left knee: Secondary | ICD-10-CM | POA: Diagnosis not present

## 2022-01-20 NOTE — Progress Notes (Addendum)
Therapist, music at [facility] ?[address], [address] ?[place], [place] [address] ?336 [phone] ?Fax [phone] ? ?Date:  01/21/2022  ? ?Name:  Natalie Fowler   DOB:  May 03, 1946   MRN:  [id number] ? ?PCP:  Darreld Mclean, MD  ? ? ?Chief Complaint: Cyst (Pt says she has had one she had a similar instance in 2020. Has biopsy scheduled for Monday. /Concerns/ questions: wheezing in the chest.) ? ? ?History of Present Illness: ? ?Natalie Fowler is a 76 y.o. very pleasant female patient who presents with the following: ? ?Patient with history of mild diabetes, here today with concern of routine follow-up and wheezing  ?Most recent visit with myself was in December for sick visit-she had the same wheezing at that time ? ?We have tried a steroid inhaler, abx, albuterol, trial of proton pump inhibitor- nothing unfortunately has resolved her symptoms ?She continues to notice daily wheezing- tends to be worse at night ?Albuterol sometimes helps her temporarily ?She thinks she has been wheezing for about 5 months now-she otherwise feels well ?CXR was negative last fall ? ?She has never been a smoker   ? ?COVID-19 booster ?Can update A1c today ?Lab Results  ?Component Value Date  ? HGBA1C 7.8 (H) 09/11/2021  ? ? ? ?Patient Active Problem List  ? Diagnosis Date Noted  ? 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 Dyslipidemia 05/02/2021  ? Diet-controlled diabetes mellitus ([place]) 01/20/2021  ? Low back pain 11/19/2016  ? History of vitamin D deficiency 09/30/2016  ? H/O colonoscopy with polypectomy 09/30/2015  ? Vaginal atrophy 09/25/2014  ? Vertigo   ? Osteopenia   ? Acid reflux   ? ? ?Past Medical History:  ?Diagnosis Date  ? Acid reflux   ? Arthritis   ? 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 Osteopenia   ? Ovarian cyst, bilateral   ? Pneumonia   ? PONV (postoperative nausea and vomiting)   ? Pre-diabetes   ? Vertigo   ? Vitamin D deficiency   ? ? ?Past Surgical History:  ?Procedure Laterality Date  ? APPENDECTOMY     ? CHOLECYSTECTOMY    ? CYST EXCISION    ? CHEST   ? JOINT REPLACEMENT    ? OOPHORECTOMY  10/23/2006  ? DIAG LAP WITH BSO  ? PELVIC LAPAROSCOPY  10/23/2006  ? DIAG LAP WITH BSO  ? ROTATOR CUFF REPAIR    ? 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  ? Procedure: TOTAL KNEE ARTHROPLASTY;  Surgeon: Paralee Cancel, MD;  Location: [facility];  Service: Orthopedics;  Laterality: Left;  ? ? ?Social History  ? ?Tobacco Use  ? Smoking status: Never  ? Smokeless tobacco: Never  ?Vaping Use  ? Vaping Use: Never used  ?Substance Use Topics  ? Alcohol use: No  ?  Alcohol/week: 0.0 standard drinks  ? Drug use: Never  ? ? ?Family History  ?Problem Relation Age of Onset  ? Diabetes Mother   ? Hypertension Mother   ? Cancer Father   ?     COLON CA  ? Osteoporosis Sister   ? Cancer Sister 47  ?     PANCREATIC  ? Cancer Maternal Aunt   ?     OVARIAN CANCER  ? Heart disease Maternal Aunt   ? Hypertension Daughter   ? Cancer Paternal Uncle   ?     Colon cancer  ? Breast cancer Neg Hx   ? ? ?Allergies  ?  Allergen Reactions  ? Vioxx [Rofecoxib] Rash  ? ? ?Medication list has been reviewed and updated. ? ?Current Outpatient Medications on File Prior to Visit  ?Medication Sig Dispense Refill  ? acetaminophen (TYLENOL) 325 MG tablet Take 650 mg by mouth every 6 (six) hours as needed for moderate pain.    ? albuterol (VENTOLIN HFA) 108 (90 Base) MCG/ACT inhaler Inhale 2 puffs into the lungs every 6 (six) hours as needed for wheezing or shortness of breath. 8 g 2  ? amoxicillin-clavulanate (AUGMENTIN) 500-125 MG tablet Take 1 tablet (500 mg total) by mouth in the morning and at bedtime. 14 tablet 0  ? benzonatate (TESSALON PERLES) 100 MG capsule Take 1 capsule (100 mg total) by mouth 3 (three) times daily as needed for cough. 20 capsule 0  ? cholecalciferol (VITAMIN D) 1000 units tablet Take 1,000 Units by mouth daily.    ? docusate sodium (COLACE) 100 MG capsule Take 1 capsule (100 mg total) by mouth 2 (two) times daily. 10 capsule 0  ? Fluticasone  Furoate (ARNUITY ELLIPTA) 100 MCG/ACT AEPB Inhale 1-2 puffs into the lungs daily. 1 each 3  ? lisinopril (ZESTRIL) 5 MG tablet Take 1 tablet (5 mg total) by mouth daily. 90 tablet 3  ? metFORMIN (GLUCOPHAGE) 500 MG tablet Take 1 tablet (500 mg total) by mouth daily. 90 tablet 3  ? pantoprazole (PROTONIX) 40 MG tablet Take 1 tablet (40 mg total) by mouth daily. 30 tablet 0  ? raloxifene (EVISTA) 60 MG tablet Take 1 tablet (60 mg total) by mouth daily. 90 tablet 3  ? ?No current facility-administered medications on file prior to visit.  ? ? ?Review of Systems: ? ?As per HPI- otherwise negative. ?Pt reports no glaucoma  ? ?Physical Examination: ?Vitals:  ? 01/21/22 1316  ?BP: 120/72  ?Pulse: 90  ?Resp: 18  ?Temp: 98.1 ?F (36.7 ?C)  ?SpO2: 98%  ? ?Vitals:  ? 01/21/22 1316  ?Weight: 169 lb 9.6 oz (76.9 kg)  ?Height: 5' 0.25" (1.53 m)  ? ?Body mass index is 32.85 kg/m?. ?Ideal Body Weight: Weight in (lb) to have BMI = 25: 128.8 ? ?GEN: no acute distress.  Mildly obese, looks well ?HEENT: Atraumatic, Normocephalic.  ?Ears and Nose: No external deformity. ?CV: RRR, No M/G/R. No JVD. No thrill. No extra heart sounds. ?PULM: Bilateral wheezing is auscultated on exam. No retractions. No resp. distress. No accessory muscle use. ?ABD: S, NT, ND, +BS. No rebound. No HSM. ?EXTR: No c/c/e ?PSYCH: Normally interactive. Conversant.  ? ?Given a duoneb which did help -wheezing resolved on recheck ?Assessment/plan ?Diet-controlled diabetes mellitus ([place]) - Plan: Comprehensive metabolic panel, Hemoglobin A1c ? ?Wheezing - Plan: montelukast (SINGULAIR) 10 MG tablet, ipratropium-albuterol (DUONEB) 0.5-2.5 (3) MG/3ML nebulizer solution 3 mL, Tiotropium Bromide Monohydrate (SPIRIVA RESPIMAT) 1.25 MCG/ACT AERS ? ?Chronic cough - Plan: montelukast (SINGULAIR) 10 MG tablet ? ?Medication monitoring encounter - Plan: CBC ? ?Patient seen today with persistent wheezing for several months.  She did respond well to a DuoNeb today.  No history of  glaucoma ?As such, we will try adding Spiriva to her regimen.  I have asked her to let me know if this is helpful.  We will also try adding Singulair ? ?Will plan further follow- up pending labs. ? ? ?Signed ?Lamar Blinks, MD ? ?Received her labs as below, message to patient ?Results for orders placed or performed in visit on 01/21/22  ?CBC  ?Result Value Ref Range  ? WBC 9.3 4.0 - 10.5 K/uL  ?  RBC 4.82 3.87 - 5.11 Mil/uL  ? Platelets 181.0 150.0 - 400.0 K/uL  ? Hemoglobin 14.2 12.0 - 15.0 g/dL  ? HCT 42.8 36.0 - 46.0 %  ? MCV 88.7 78.0 - 100.0 fl  ? MCHC 33.1 30.0 - 36.0 g/dL  ? RDW 13.3 11.5 - 15.5 %  ?Comprehensive metabolic panel  ?Result Value Ref Range  ? Sodium 140 135 - 145 mEq/L  ? Potassium 4.0 3.5 - 5.1 mEq/L  ? Chloride 104 96 - 112 mEq/L  ? CO2 27 19 - 32 mEq/L  ? Glucose, Bld 96 70 - 99 mg/dL  ? BUN 16 6 - 23 mg/dL  ? Creatinine, Ser 0.93 0.40 - 1.20 mg/dL  ? Total Bilirubin 0.4 0.2 - 1.2 mg/dL  ? Alkaline Phosphatase 53 39 - 117 U/L  ? AST 19 0 - 37 U/L  ? ALT 22 0 - 35 U/L  ? Total Protein 6.7 6.0 - 8.3 g/dL  ? Albumin 4.2 3.5 - 5.2 g/dL  ? GFR 60.83 >60.00 mL/min  ? Calcium 9.7 8.4 - 10.5 mg/dL  ?Hemoglobin A1c  ?Result Value Ref Range  ? Hgb A1c MFr Bld 7.2 (H) 4.6 - 6.5 %  ? ? ? ?

## 2022-01-21 ENCOUNTER — Ambulatory Visit (INDEPENDENT_AMBULATORY_CARE_PROVIDER_SITE_OTHER): Payer: PPO | Admitting: Family Medicine

## 2022-01-21 ENCOUNTER — Encounter: Payer: Self-pay | Admitting: Family Medicine

## 2022-01-21 VITALS — BP 120/72 | HR 90 | Temp 98.1°F | Resp 18 | Ht 60.25 in | Wt 169.6 lb

## 2022-01-21 DIAGNOSIS — R053 Chronic cough: Secondary | ICD-10-CM | POA: Diagnosis not present

## 2022-01-21 DIAGNOSIS — R062 Wheezing: Secondary | ICD-10-CM | POA: Diagnosis not present

## 2022-01-21 DIAGNOSIS — Z5181 Encounter for therapeutic drug level monitoring: Secondary | ICD-10-CM | POA: Diagnosis not present

## 2022-01-21 DIAGNOSIS — E119 Type 2 diabetes mellitus without complications: Secondary | ICD-10-CM

## 2022-01-21 LAB — CBC
HCT: 42.8 % (ref 36.0–46.0)
Hemoglobin: 14.2 g/dL (ref 12.0–15.0)
MCHC: 33.1 g/dL (ref 30.0–36.0)
MCV: 88.7 fl (ref 78.0–100.0)
Platelets: 181 10*3/uL (ref 150.0–400.0)
RBC: 4.82 Mil/uL (ref 3.87–5.11)
RDW: 13.3 % (ref 11.5–15.5)
WBC: 9.3 10*3/uL (ref 4.0–10.5)

## 2022-01-21 LAB — COMPREHENSIVE METABOLIC PANEL
ALT: 22 U/L (ref 0–35)
AST: 19 U/L (ref 0–37)
Albumin: 4.2 g/dL (ref 3.5–5.2)
Alkaline Phosphatase: 53 U/L (ref 39–117)
BUN: 16 mg/dL (ref 6–23)
CO2: 27 mEq/L (ref 19–32)
Calcium: 9.7 mg/dL (ref 8.4–10.5)
Chloride: 104 mEq/L (ref 96–112)
Creatinine, Ser: 0.93 mg/dL (ref 0.40–1.20)
GFR: 60.83 mL/min (ref >60.00)
Glucose, Bld: 96 mg/dL (ref 70–99)
Potassium: 4 mEq/L (ref 3.5–5.1)
Sodium: 140 mEq/L (ref 135–145)
Total Bilirubin: 0.4 mg/dL (ref 0.2–1.2)
Total Protein: 6.7 g/dL (ref 6.0–8.3)

## 2022-01-21 LAB — HEMOGLOBIN A1C: Hgb A1c MFr Bld: 7.2 % — ABNORMAL HIGH (ref 4.6–6.5)

## 2022-01-21 MED ORDER — MONTELUKAST SODIUM 10 MG PO TABS: 10.0000 mg | ORAL_TABLET | Freq: Every day | ORAL | 3 refills | Status: DC

## 2022-01-21 MED ORDER — IPRATROPIUM-ALBUTEROL 0.5-2.5 (3) MG/3ML IN SOLN
3.0000 mL | Freq: Once | RESPIRATORY_TRACT | Status: AC
  Administered 2022-01-21: 3 mL via RESPIRATORY_TRACT

## 2022-01-21 MED ORDER — SPIRIVA RESPIMAT 1.25 MCG/ACT IN AERS: 2.0000 | INHALATION_SPRAY | Freq: Every day | RESPIRATORY_TRACT | 11 refills | Status: DC

## 2022-01-21 NOTE — Patient Instructions (Signed)
I will be in touch with your labs asap ? ?Let's try adding ?-Singulair tablet ?-Spiriva inhaler (2 puffs daily) for your wheezing ? ?Please let me know if this is helpful for you  ?

## 2022-01-29 DIAGNOSIS — K136 Irritative hyperplasia of oral mucosa: Secondary | ICD-10-CM | POA: Diagnosis not present

## 2022-02-04 ENCOUNTER — Other Ambulatory Visit: Payer: Self-pay | Admitting: Obstetrics & Gynecology

## 2022-02-04 DIAGNOSIS — M8589 Other specified disorders of bone density and structure, multiple sites: Secondary | ICD-10-CM

## 2022-02-05 ENCOUNTER — Other Ambulatory Visit: Payer: Self-pay

## 2022-02-05 ENCOUNTER — Telehealth: Payer: Self-pay | Admitting: *Deleted

## 2022-02-05 DIAGNOSIS — M8589 Other specified disorders of bone density and structure, multiple sites: Secondary | ICD-10-CM

## 2022-02-05 MED ORDER — RALOXIFENE HCL 60 MG PO TABS: 60.0000 mg | ORAL_TABLET | Freq: Every day | ORAL | 3 refills | Status: DC

## 2022-02-05 NOTE — Telephone Encounter (Signed)
Patient called requesting Evista 60 mg tablet, per note office note on annual exam 10/2021 "Continue Evista, prescribed by Fam MD Dr Lorelei Pont." Patient reports you have always filled Rx for her, Dr.Copland. patient has 1 pill left and she would refills to come from you. Please advise  ?

## 2022-02-05 NOTE — Telephone Encounter (Signed)
Received request for Evista. Do you typically fill this Rx? ?

## 2022-02-06 NOTE — Telephone Encounter (Signed)
I called patient and informed her that [facility] approved Rx. Patient said Dr.Copland approved Rx on 02/05/22 #90 supply with 3 refills. I explained that she should decided which provider she would like to continue prescribing medication to avoid denial of medication future ( Rx was denied on 02/04/22 because Dr.Copland prescribed in 2022) patient said she has a visit with Dr.Copland on 03/12/22 and would like to discuss this with her and she will get back with the office and let us know if she wants Dr.Lavoie to send in evist 60 mg tablet. She did not want me to send in Rx now. Patient will call back to follow up. ?

## 2022-02-06 NOTE — Telephone Encounter (Signed)
Dr.Lavoie replied  ? ?"Agree to refill Evista. " ?

## 2022-03-10 NOTE — Progress Notes (Signed)
Therapist, music at [facility] ?[address], [address] ?[place], [place] [address] ?336 [phone] ?Fax [phone] ? ?Date:  03/12/2022  ? ?Name:  Natalie Fowler   DOB:  June 26, 1946   MRN:  [id number] ? ?PCP:  Darreld Mclean, MD  ? ? ?Chief Complaint: 6 month follow up (Concerns/ questions: 1. pt would like to see a Pulmonologist. 2. Pt says she has questions about a prescription. ) ? ? ?History of Present Illness: ? ?Natalie Fowler is a 76 y.o. very pleasant female patient who presents with the following: ? ?Patient seen today for periodic follow-up ?Most recent visit with myself last month with respiratory illness-we added Spiriva to her regimen at that time: ?Patient seen today with persistent wheezing for several months.  She did respond well to a DuoNeb today.  No history of glaucoma ?As such, we will try adding Spiriva to her regimen.  I have asked her to let me know if this is helpful.  We will also try adding Singulair ? ?Pt notes she still does use albuterol on occasion, and it may help her ?However spiriva, Singulair did not seem to help. ?At this point she would like to see pulmonology which is certainly fine.  We will make a referral ?She does have history of acid reflux.  She does not feel her current symptoms are related to reflux however ?Her diabetes has generally been well controlled.  Currently taking metformin 500 daily. ? ?She would like [place] to take over her bone density scans and medication which is fine. She is taking Evista daily- tolerates well with no particular SE that she has noticed  ? ?Lab Results  ?Component Value Date  ? HGBA1C 7.2 (H) 01/21/2022  ? ?COVID-19 booster may be due-advised patient that CDC just recommended a second by [facility] booster for adults over 65 ? ?She is trying to eat more vegetables but this is not always easy  ? ?Wt Readings from Last 3 Encounters:  ?03/12/22 166 lb 6.4 oz (75.5 kg)  ?01/21/22 169 lb 9.6 oz (76.9 kg)  ?11/06/21 168 lb (76.2 kg)   ? ? ? ?Patient Active Problem List  ? Diagnosis Date Noted  ? 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 Dyslipidemia 05/02/2021  ? Diet-controlled diabetes mellitus ([place]) 01/20/2021  ? Low back pain 11/19/2016  ? History of vitamin D deficiency 09/30/2016  ? H/O colonoscopy with polypectomy 09/30/2015  ? Vaginal atrophy 09/25/2014  ? Vertigo   ? Osteopenia   ? Acid reflux   ? ? ?Past Medical History:  ?Diagnosis Date  ? Acid reflux   ? Arthritis   ? 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 Osteopenia   ? Ovarian cyst, bilateral   ? Pneumonia   ? PONV (postoperative nausea and vomiting)   ? Pre-diabetes   ? Vertigo   ? Vitamin D deficiency   ? ? ?Past Surgical History:  ?Procedure Laterality Date  ? APPENDECTOMY    ? CHOLECYSTECTOMY    ? CYST EXCISION    ? CHEST   ? JOINT REPLACEMENT    ? OOPHORECTOMY  10/23/2006  ? DIAG LAP WITH BSO  ? PELVIC LAPAROSCOPY  10/23/2006  ? DIAG LAP WITH BSO  ? ROTATOR CUFF REPAIR    ? 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  ? Procedure: TOTAL KNEE ARTHROPLASTY;  Surgeon: Paralee Cancel, MD;  Location: [facility];  Service: Orthopedics;  Laterality: Left;  ? ? ?Social History  ? ?Tobacco  Use  ? Smoking status: Never  ? Smokeless tobacco: Never  ?Vaping Use  ? Vaping Use: Never used  ?Substance Use Topics  ? Alcohol use: No  ?  Alcohol/week: 0.0 standard drinks  ? Drug use: Never  ? ? ?Family History  ?Problem Relation Age of Onset  ? Diabetes Mother   ? Hypertension Mother   ? Cancer Father   ?     COLON CA  ? Osteoporosis Sister   ? Cancer Sister 17  ?     PANCREATIC  ? Cancer Maternal Aunt   ?     OVARIAN CANCER  ? Heart disease Maternal Aunt   ? Hypertension Daughter   ? Cancer Paternal Uncle   ?     Colon cancer  ? Breast cancer Neg Hx   ? ? ?Allergies  ?Allergen Reactions  ? Vioxx [Rofecoxib] Rash  ? ? ?Medication list has been reviewed and updated. ? ?Current Outpatient Medications on File Prior to Visit  ?Medication Sig Dispense Refill  ? albuterol (VENTOLIN HFA) 108 (90 Base) MCG/ACT  inhaler Inhale 2 puffs into the lungs every 6 (six) hours as needed for wheezing or shortness of breath. 8 g 2  ? cholecalciferol (VITAMIN D) 1000 units tablet Take 1,000 Units by mouth daily.    ? lisinopril (ZESTRIL) 5 MG tablet Take 1 tablet (5 mg total) by mouth daily. 90 tablet 3  ? metFORMIN (GLUCOPHAGE) 500 MG tablet Take 1 tablet (500 mg total) by mouth daily. 90 tablet 3  ? raloxifene (EVISTA) 60 MG tablet Take 1 tablet (60 mg total) by mouth daily. 90 tablet 3  ? ?No current facility-administered medications on file prior to visit.  ? ? ?Review of Systems: ? ?As per HPI- otherwise negative. ? ? ?Physical Examination: ?Vitals:  ? 03/12/22 0837  ?BP: 122/72  ?Pulse: 99  ?Resp: 18  ?Temp: 97.7 ?F (36.5 ?C)  ?SpO2: 97%  ? ?Vitals:  ? 03/12/22 0837  ?Weight: 166 lb 6.4 oz (75.5 kg)  ?Height: 5' 0.25" (1.53 m)  ? ?Body mass index is 32.23 kg/m?. ?Ideal Body Weight: Weight in (lb) to have BMI = 25: 128.8 ? ?GEN: no acute distress.  Mild obesity, looks well ?HEENT: Atraumatic, Normocephalic.  ?Ears and Nose: No external deformity. ?CV: RRR, No M/G/R. No JVD. No thrill. No extra heart sounds. ?PULM: CTA B, no wheezes, crackles, rhonchi. No retractions. No resp. distress. No accessory muscle use. ?EXTR: No c/c/e ?PSYCH: Normally interactive. Conversant.  ? ? ?Assessment and Plan: ?Wheezing - Plan: Ambulatory referral to Pulmonology, omeprazole (PRILOSEC) 40 MG capsule ? ?Chronic cough - Plan: Ambulatory referral to Pulmonology, omeprazole (PRILOSEC) 40 MG capsule ? ?Following up today.  Patient continues to have intermittent wheezing and cough.  We have tried a few different things to no avail.  Referral placed to pulmonology.  I did suggest that she try taking a PPI for 2 to 4 weeks also in case this may be reflux after all. ? ?Signed ?Lamar Blinks, MD ? ?

## 2022-03-12 ENCOUNTER — Ambulatory Visit (INDEPENDENT_AMBULATORY_CARE_PROVIDER_SITE_OTHER): Payer: PPO | Admitting: Family Medicine

## 2022-03-12 VITALS — BP 122/72 | HR 99 | Temp 97.7°F | Resp 18 | Ht 60.25 in | Wt 166.4 lb

## 2022-03-12 DIAGNOSIS — R053 Chronic cough: Secondary | ICD-10-CM

## 2022-03-12 DIAGNOSIS — R062 Wheezing: Secondary | ICD-10-CM

## 2022-03-12 MED ORDER — OMEPRAZOLE 40 MG PO CPDR: 40.0000 mg | DELAYED_RELEASE_CAPSULE | Freq: Every day | ORAL | 0 refills | Status: DC

## 2022-03-12 NOTE — Patient Instructions (Addendum)
Good to see you today- we will get you set up with Pulmonology to look at your lungs in more detail ?In the meantime let's try treating you for possible acid reflux with omeprazole for 2-4 weeks ? ?Let me know if any worsening! ? ?Brand new recommendation for 2nd dose of covid bivalent shot: ? ?The Centers for Disease Control and Prevention on Wednesday backed a second dose of the updated Covid boosters for older adults and people with weakened immune systems. ? ?The recommendation is in line with the stance of the Food and Drug Administration, which authorized the additional dose Tuesday. ? ?Those 57 and older can get second doses of the updated versions of Pfizer-BioNTech?s and Moderna?s Covid boosters at least four months after their last doses, the FDA said in a statement. Most people who are immunocompromised can get additional doses at least two months after their last doses, according to the agency. ? ? ?

## 2022-03-17 ENCOUNTER — Encounter: Payer: Self-pay | Admitting: Family Medicine

## 2022-04-07 ENCOUNTER — Encounter: Payer: Self-pay | Admitting: Pulmonary Disease

## 2022-04-07 ENCOUNTER — Ambulatory Visit: Payer: PPO | Admitting: Pulmonary Disease

## 2022-04-07 VITALS — BP 132/76 | HR 88 | Temp 98.2°F | Ht 60.0 in | Wt 172.0 lb

## 2022-04-07 DIAGNOSIS — R062 Wheezing: Secondary | ICD-10-CM

## 2022-04-07 DIAGNOSIS — R0989 Other specified symptoms and signs involving the circulatory and respiratory systems: Secondary | ICD-10-CM | POA: Diagnosis not present

## 2022-04-07 NOTE — Patient Instructions (Signed)
Nice to meet you ? ?I recommend a CT scan of the chest to evaluate reasons for wheeze, specifically the main air tube like the trachea to make sure there is no abnormality.  If this is abnormal, we will make additional recommendations.  If the CT scan is totally clear and normal, would likely recommend a different type of inhaler to help with the wheeze. ? ?Someone will call you to schedule the CT scan and our office will be in touch regarding the results of neck steps ? ?Return to clinic in 2 months or sooner as needed with Dr. Silas Flood ?

## 2022-04-07 NOTE — Progress Notes (Signed)
? ?'@Patient'$  ID: Natalie Fowler, female    DOB: [birth year], 76 y.o.   MRN: [id number] ? ?Chief Complaint  ?Patient presents with  ? Consult  ?  Pt states that at night she has cough that has wheezing noted with it. She states she has clear mucus with the cough. Pt states that she had surgery 2 years ago in June and had PNA after and this started shortly after. Pt states inhalers have not helped her cough or wheezing.   ? ? ?Referring provider: ?Copland, Gay Filler, MD ? ?HPI:  ? ?76 y.o. woman whom we are seeing in consultation for evaluation of wheeze.  Most recent PCP note x3 reviewed. ? ?Patient was usual state of health.  Had knee replacement summer 2021.  Shortly thereafter developed pneumonia per report.  Chest x-ray 04/2020 reviewed with on my interpretation what appears to be faint left-sided infiltrate near cardiac apex.  Repeat chest x-ray 05/2020 with resolution of infiltrate on my review and interpretation.  However, she started wheezing thereafter.  Worse in the evenings.  Around 6:00.  Further worsens when lying supine or semirecumbent.  Bothers her, while the wheeze.  She hears until she eventually falls asleep.  Then throughout the day, wheeze not as prominent.  And again worsens in the evening.  She is tried multiple inhalers.  Arnuity in the past without improvement.  Recently started Spiriva without improvement.  Has been using albuterol and reports intermittent improvement in symptoms.  Not reliably improved with every use.  She denies any atopic symptoms.  No history of seasonal allergies.  No history of asthma.  No history of breathing issues.  She has no dyspnea.  Most recent chest x-ray 08/2021 reviewed and interpreted as clear lungs bilaterally. ? ?PMH: Diabetes, sleep apnea ?Surgical tree: Gallbladder surgery, appendectomy, bilateral knee replacement ?Family: Sister and father history of cancer ?Social history: Never smoker, retired from Adult nurse, lives in [place] ? ? ?Questionaires /  Pulmonary Flowsheets:  ? ?ACT:  ?   ? View : No data to display.  ?  ?  ?  ? ? ?MMRC: ?   ? View : No data to display.  ?  ?  ?  ? ? ?Epworth:  ?   ? View : No data to display.  ?  ?  ?  ? ? ?Tests:  ? ?FENO:  ?No results found for: NITRICOXIDE ? ?PFT: ?   ? View : No data to display.  ?  ?  ?  ? ? ?WALK:  ?   ? View : No data to display.  ?  ?  ?  ? ? ?Imaging: ?Personally reviewed and as per EMR and discussion this note ?No results found. ? ?Lab Results: ?Personally reviewed ?CBC ?   ?Component Value Date/Time  ? WBC 9.3 01/21/2022 1349  ? RBC 4.82 01/21/2022 1349  ? HGB 14.2 01/21/2022 1349  ? HCT 42.8 01/21/2022 1349  ? PLT 181.0 01/21/2022 1349  ? MCV 88.7 01/21/2022 1349  ? MCH 29.8 06/04/2021 0322  ? MCHC 33.1 01/21/2022 1349  ? RDW 13.3 01/21/2022 1349  ? ? ?BMET ?   ?Component Value Date/Time  ? NA 140 01/21/2022 1349  ? NA 140 10/02/2019 0000  ? K 4.0 01/21/2022 1349  ? CL 104 01/21/2022 1349  ? CO2 27 01/21/2022 1349  ? GLUCOSE 96 01/21/2022 1349  ? BUN 16 01/21/2022 1349  ? BUN 12 10/02/2019 0000  ? CREATININE 0.93 01/21/2022 1349  ?  CREATININE 0.84 09/26/2020 0911  ? CALCIUM 9.7 01/21/2022 1349  ? GFRNONAA 57 (L) 06/04/2021 0322  ? ? ?BNP ?No results found for: BNP ? ?ProBNP ?No results found for: PROBNP ? ?Specialty Problems   ?None ? ? ?Allergies  ?Allergen Reactions  ? Vioxx [Rofecoxib] Rash  ? ? ?Immunization History  ?Administered Date(s) Administered  ? Fluad Quad(high Dose 65+) 09/26/2020, 09/11/2021  ? PFIZER(Purple Top)SARS-COV-2 Vaccination 12/29/2019, 01/23/2020, 08/25/2020  ? Ambulance person Booster 5y-11y 09/15/2021  ? Pneumococcal Conjugate-13 08/28/2014  ? Pneumococcal Polysaccharide-23 09/16/2015  ? Td 08/28/2014  ? Tdap 09/21/2013  ? Zoster Recombinat (Shingrix) 11/07/2018, 03/08/2019  ? ? ?Past Medical History:  ?Diagnosis Date  ? Acid reflux   ? Arthritis   ? 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 Osteopenia   ? Ovarian cyst, bilateral   ? Pneumonia   ? PONV (postoperative  nausea and vomiting)   ? Pre-diabetes   ? Vertigo   ? Vitamin D deficiency   ? ? ?Tobacco History: ?Social History  ? ?Tobacco Use  ?Smoking Status Never  ?Smokeless Tobacco Never  ? ?Counseling given: Not Answered ? ? ?Continue to not smoke ? ?Outpatient Encounter Medications as of 04/07/2022  ?Medication Sig  ? albuterol (VENTOLIN HFA) 108 (90 Base) MCG/ACT inhaler Inhale 2 puffs into the lungs every 6 (six) hours as needed for wheezing or shortness of breath.  ? cholecalciferol (VITAMIN D) 1000 units tablet Take 1,000 Units by mouth daily.  ? lisinopril (ZESTRIL) 5 MG tablet Take 1 tablet (5 mg total) by mouth daily.  ? metFORMIN (GLUCOPHAGE) 500 MG tablet Take 1 tablet (500 mg total) by mouth daily.  ? omeprazole (PRILOSEC) 40 MG capsule Take 1 capsule (40 mg total) by mouth daily. Take for 2-4 weeks for refulx  ? raloxifene (EVISTA) 60 MG tablet Take 1 tablet (60 mg total) by mouth daily.  ? ?No facility-administered encounter medications on file as of 04/07/2022.  ? ? ? ?Review of Systems ? ?Review of Systems  ?No chest pain with exertion.  No orthopnea or PND.  No lower extreme swelling.  Comprehensive review of systems otherwise negative. ?Physical Exam ? ?BP 132/76 (BP Location: Right Arm, Patient Position: Sitting, Cuff Size: Normal)   Pulse 88   Temp 98.2 ?F (36.8 ?C) (Oral)   Ht 5' (1.524 m)   Wt 172 lb (78 kg)   SpO2 99%   BMI 33.59 kg/m?  ? ?Wt Readings from Last 5 Encounters:  ?04/07/22 172 lb (78 kg)  ?03/12/22 166 lb 6.4 oz (75.5 kg)  ?01/21/22 169 lb 9.6 oz (76.9 kg)  ?11/06/21 168 lb (76.2 kg)  ?10/31/21 169 lb (76.7 kg)  ? ? ?BMI Readings from Last 5 Encounters:  ?04/07/22 33.59 kg/m?  ?03/12/22 32.23 kg/m?  ?01/21/22 32.85 kg/m?  ?11/06/21 32.54 kg/m?  ?10/31/21 32.73 kg/m?  ? ? ? ?Physical Exam ?General: Well-appearing, sitting in chair ?Eyes: EOMI, no icterus ?Neck: Supple, no JVP ?Pulmonary: Diffuse mid to end expiratory harsh wheeze suspicious for large airway sounds, otherwise clear, no  stridor ?Cardiovascular: Regular rhythm, no murmur ?Abdomen: Nondistended, bowel sounds present ?MSK: No synovitis, joint effusion ?Neuro: Normal gait, no weakness ?Psych: Normal mood, full affect ? ? ?Assessment & Plan:  ? ? ?Wheeze: Present for couple of years.  Seems triggered after knee replacement, possible pneumonia.  Chest x-ray 04/27/2020 with possible left-sided infiltrate near the cardiac apex on chest x-ray on my review and interpretation.  Repeat chest x-ray 05/2020 with  clear lungs.  Most recent chest x-ray 08/2021 reviewed interpreted as clear lungs.  Suspect asthma triggered by inflammatory response to possible pneumonia.  However she denies atopic symptoms.  Given worsening in the evenings asthma is even more highly suspected.  Given worsening when lying supine or semirecumbent, brings into play possible tracheomalacia.  CT high-res in-store expiratory ordered to evaluate for tracheal collapse of expiration consistent with tracheomalacia.  If clear, would recommend ICS/LABA therapy for asthma.  Today, LAMA therapy has been ineffective.  Intermittent response, improvement to albuterol was reported. ? ?Cough: Intermittent.  Occasion produces thick clear sputum.  Try to get the wheezing does very suspicion for inflammatory condition such as asthma.  Work-up as above.  Cough not really bothersome. ? ?Return in about 2 months (around 06/07/2022). ? ? ?Lanier Clam, MD ?04/07/2022 ? ? ? ?

## 2022-04-14 ENCOUNTER — Other Ambulatory Visit: Payer: Self-pay | Admitting: Family Medicine

## 2022-04-14 DIAGNOSIS — R062 Wheezing: Secondary | ICD-10-CM

## 2022-04-14 DIAGNOSIS — R053 Chronic cough: Secondary | ICD-10-CM

## 2022-04-17 ENCOUNTER — Ambulatory Visit (HOSPITAL_BASED_OUTPATIENT_CLINIC_OR_DEPARTMENT_OTHER)
Admission: RE | Admit: 2022-04-17 | Discharge: 2022-04-17 | Disposition: A | Discharge status: Home / Self Care | Payer: PPO | Source: Ambulatory Visit | Attending: Pulmonary Disease | Admitting: Pulmonary Disease

## 2022-04-17 DIAGNOSIS — R0989 Other specified symptoms and signs involving the circulatory and respiratory systems: Secondary | ICD-10-CM | POA: Diagnosis not present

## 2022-04-17 DIAGNOSIS — I2510 Atherosclerotic heart disease of native coronary artery without angina pectoris: Secondary | ICD-10-CM | POA: Diagnosis not present

## 2022-04-17 DIAGNOSIS — J929 Pleural plaque without asbestos: Secondary | ICD-10-CM | POA: Diagnosis not present

## 2022-04-17 DIAGNOSIS — J9809 Other diseases of bronchus, not elsewhere classified: Secondary | ICD-10-CM | POA: Diagnosis not present

## 2022-04-17 DIAGNOSIS — K449 Diaphragmatic hernia without obstruction or gangrene: Secondary | ICD-10-CM | POA: Diagnosis not present

## 2022-04-21 ENCOUNTER — Telehealth: Payer: Self-pay | Admitting: Pulmonary Disease

## 2022-04-21 MED ORDER — FLUTICASONE-SALMETEROL 500-50 MCG/ACT IN AEPB: 1.0000 | INHALATION_SPRAY | Freq: Two times a day (BID) | RESPIRATORY_TRACT | 3 refills | Status: DC

## 2022-04-21 NOTE — Progress Notes (Signed)
CT hi resolution without evidence of tracheobronchomalacia - this is good news. As such, recommend trial of new inhaler to treat the wheeze. Will send new prescription Advair 1 puff BID. Please rinse mouth after every use.

## 2022-04-21 NOTE — Telephone Encounter (Signed)
Advair diskus high diose 1 puff BID sent to local pharmacy for wheeze.

## 2022-04-23 ENCOUNTER — Telehealth: Payer: Self-pay | Admitting: Pulmonary Disease

## 2022-04-23 DIAGNOSIS — N3010 Interstitial cystitis (chronic) without hematuria: Secondary | ICD-10-CM | POA: Diagnosis not present

## 2022-04-24 NOTE — Telephone Encounter (Signed)
Called and spoke with patient who is calling because she has question about Advair that was given to her at visit. She is worried about the side affects combined with her other meds before she begins taking it. States she has stomach and kidney issues and needs something milder. She states she is very concerned to take this medication. Advised patient that Dr. Silas Flood is not back till Monday. Patient expressed understanding and said she is ok waiting.    Please advise

## 2022-04-24 NOTE — Telephone Encounter (Signed)
Patient called back to discuss- states she has stomach and kidney issues and needs something milder. Please advise.

## 2022-04-24 NOTE — Telephone Encounter (Signed)
ATC patient, LMTCB 

## 2022-04-27 NOTE — Telephone Encounter (Signed)
I am not aware of Advair causing kidney or stomach problems. I recommend taking the medication but if she does not want to that is fine. If she were to experience side effects then I would want her to stop and let us know.

## 2022-04-28 NOTE — Telephone Encounter (Signed)
I called the patient and left a message per DPR and she was asked to call back with any questions.

## 2022-04-29 DIAGNOSIS — Z471 Aftercare following joint replacement surgery: Secondary | ICD-10-CM | POA: Diagnosis not present

## 2022-04-29 DIAGNOSIS — Z96652 Presence of left artificial knee joint: Secondary | ICD-10-CM | POA: Diagnosis not present

## 2022-05-19 IMAGING — CR DG CHEST 2V
2 series · 2 of 2 positions shown · non-contrast
Comparison: CT 10/10/2009.

CLINICAL DATA: Cough.

EXAM:
CHEST - 2 VIEW

[w chest pa]
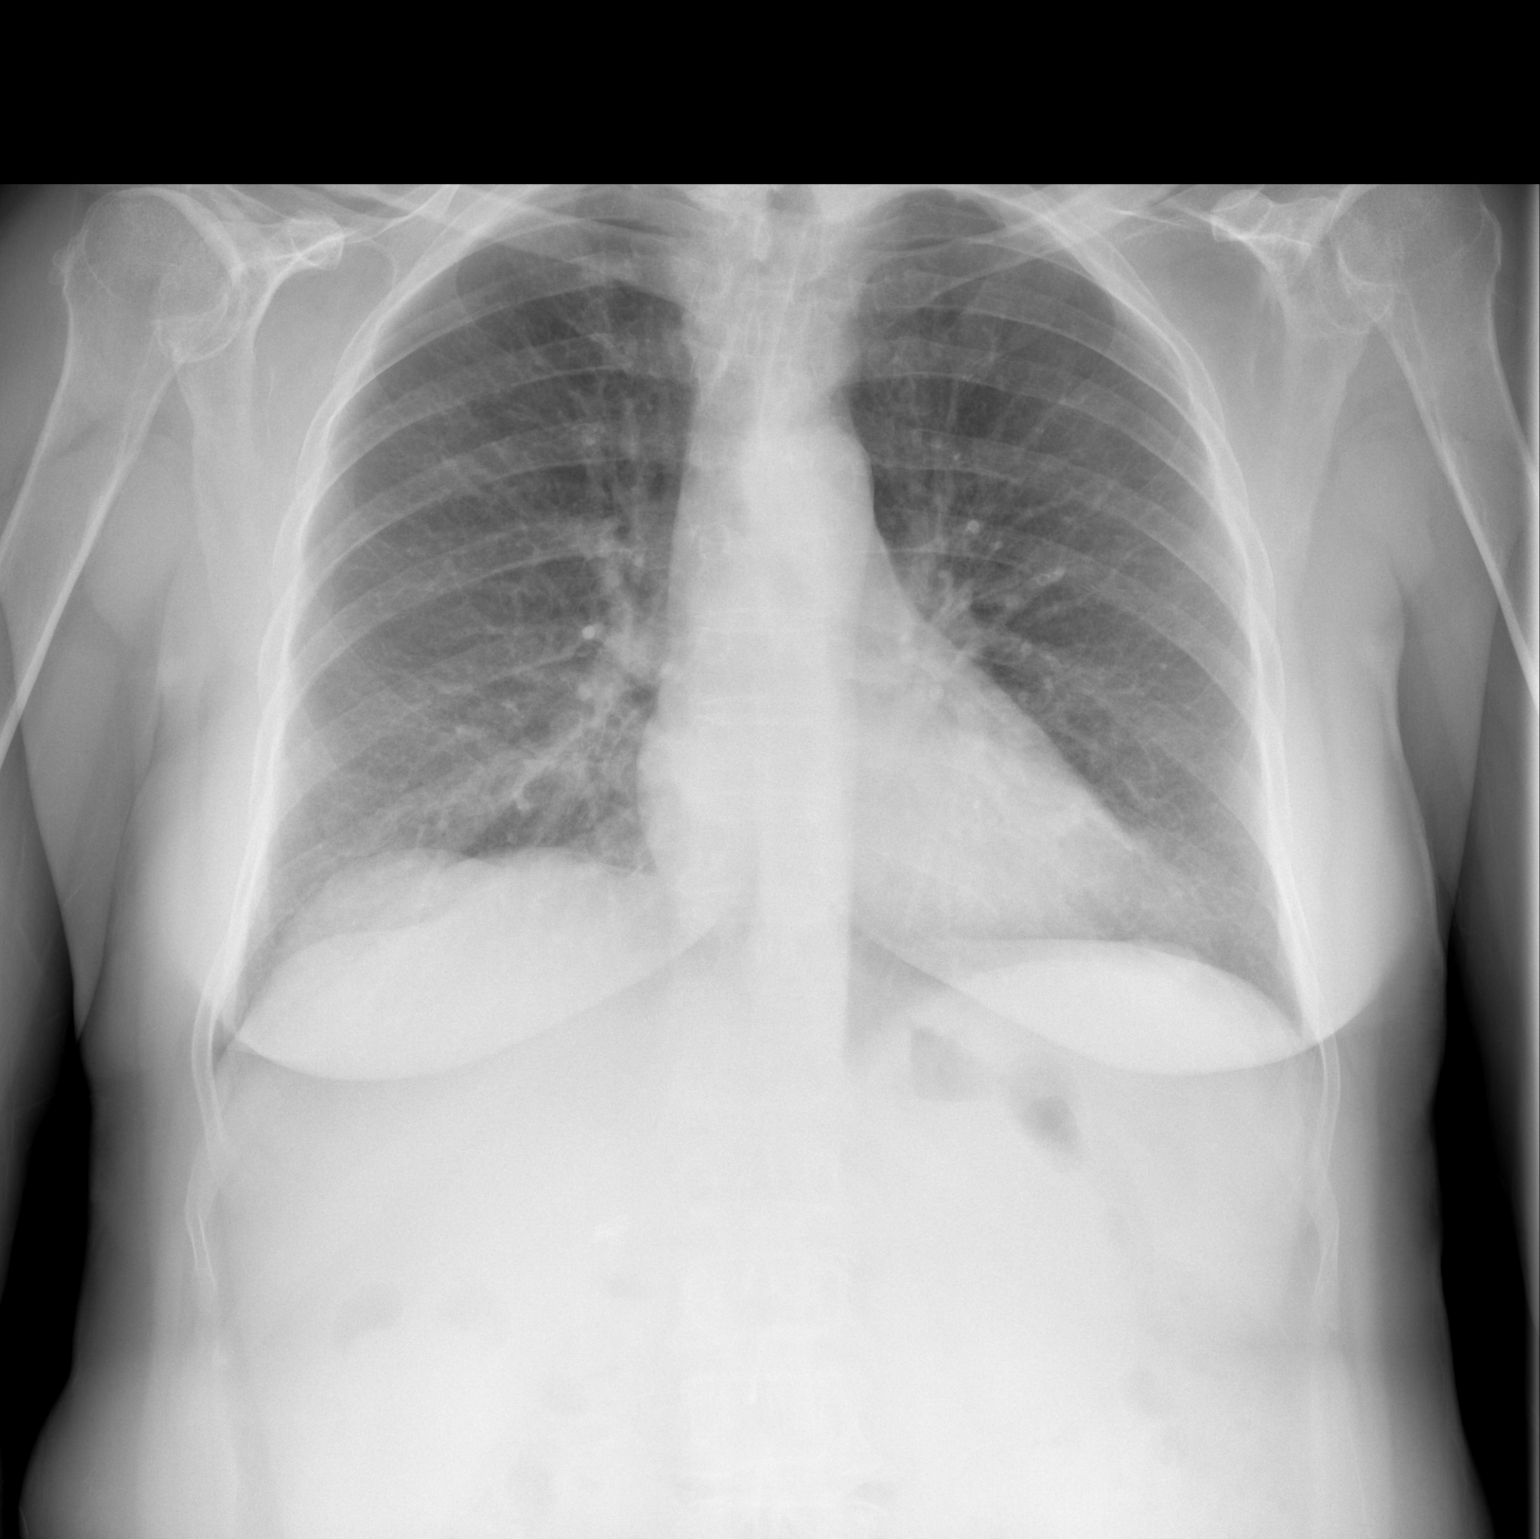

[w chest lat]
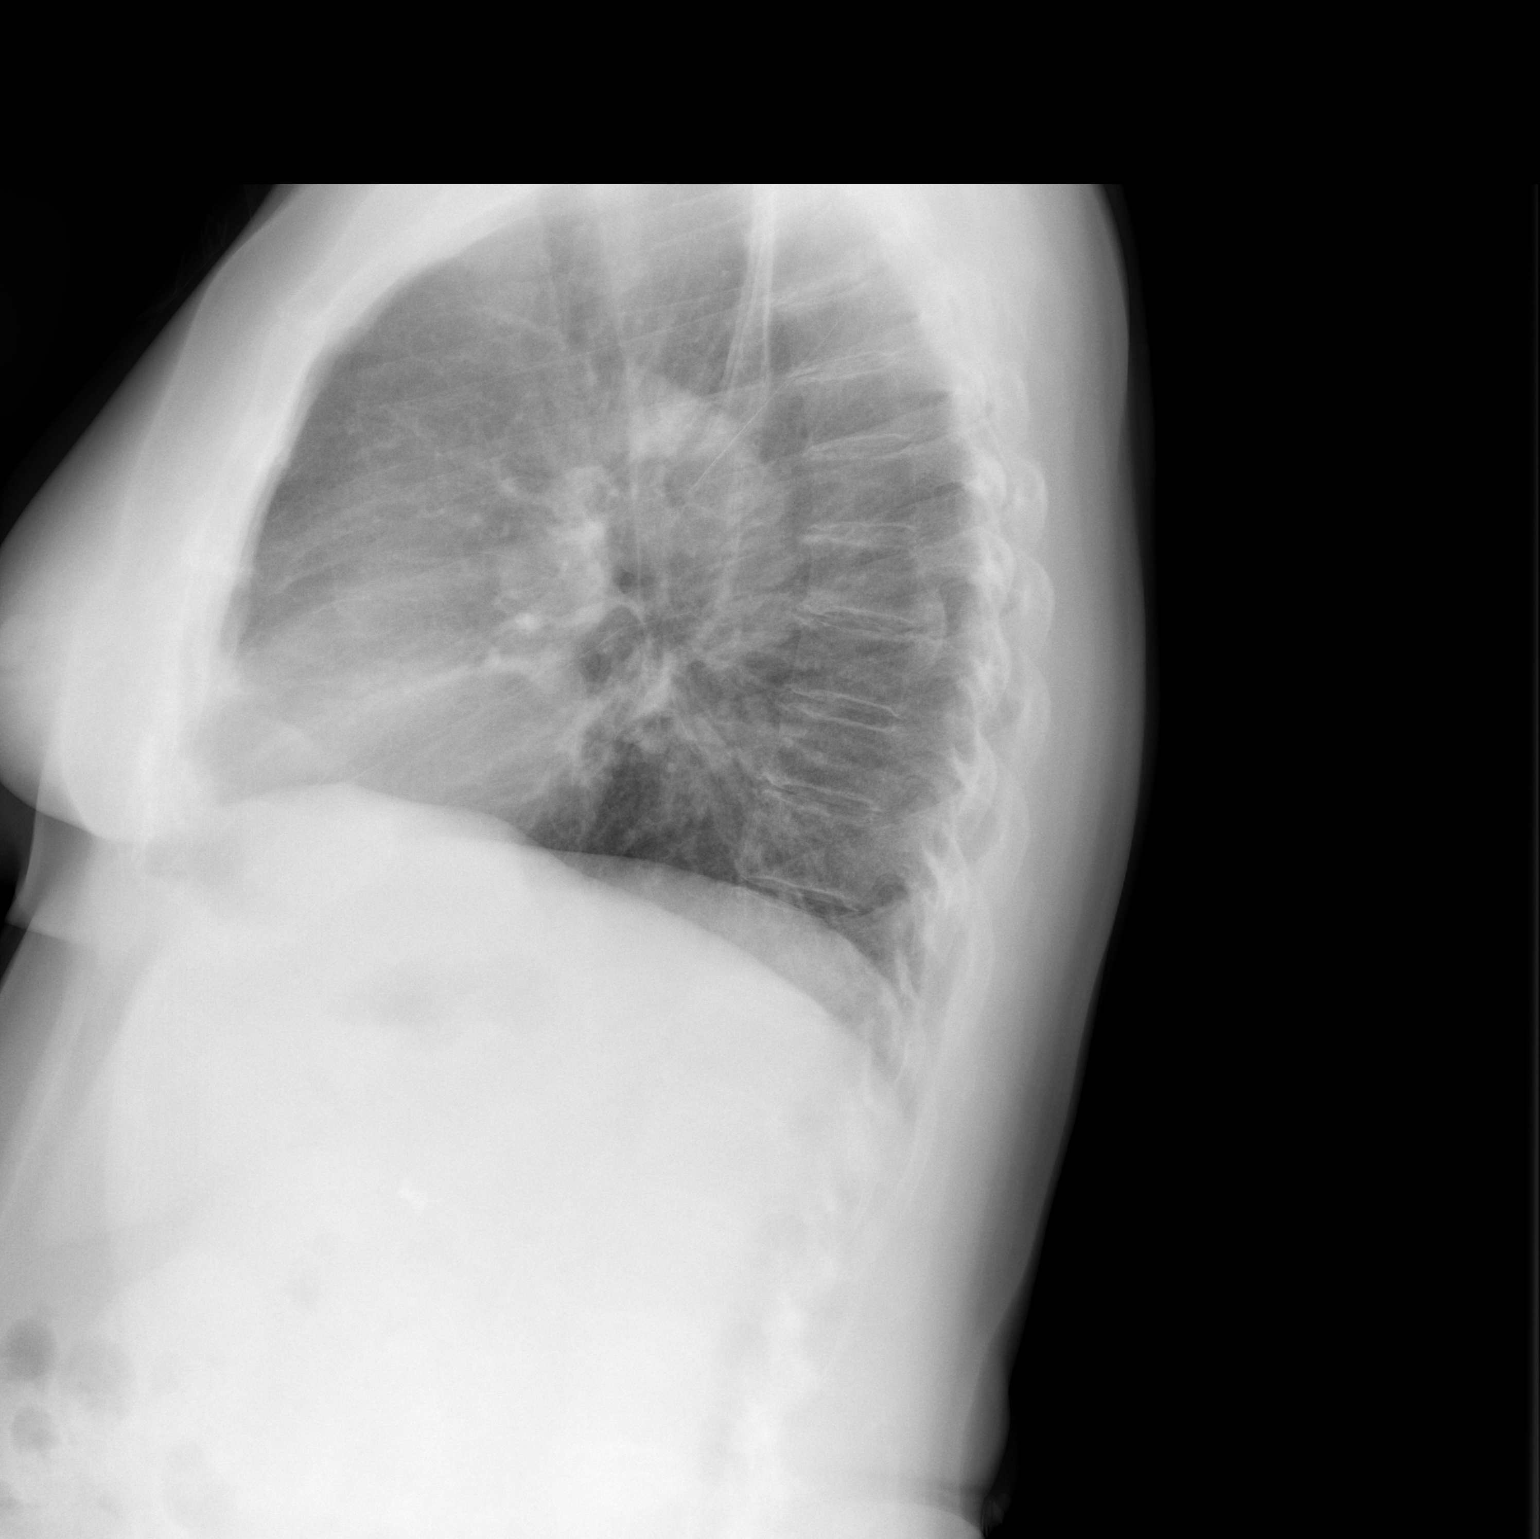

[2 of 2 positions shown; findings below may reference images not displayed]

FINDINGS: Mediastinum hilar structures normal. Low lung volumes. Mild right
base infiltrate cannot be excluded. No pleural effusion
pneumothorax. Heart size normal. No acute bony abnormality.
IMPRESSION: Low lung volumes. Mild right base atelectasis. Mild infiltrate
cannot be excluded.

## 2022-06-01 ENCOUNTER — Other Ambulatory Visit: Payer: Self-pay | Admitting: Family Medicine

## 2022-06-01 DIAGNOSIS — Z1231 Encounter for screening mammogram for malignant neoplasm of breast: Secondary | ICD-10-CM

## 2022-06-17 ENCOUNTER — Ambulatory Visit: Payer: PPO | Admitting: Pulmonary Disease

## 2022-06-17 ENCOUNTER — Encounter: Payer: Self-pay | Admitting: Pulmonary Disease

## 2022-06-17 VITALS — BP 126/68 | HR 84 | Ht 60.0 in | Wt 167.2 lb

## 2022-06-17 DIAGNOSIS — R053 Chronic cough: Secondary | ICD-10-CM | POA: Diagnosis not present

## 2022-06-17 DIAGNOSIS — R062 Wheezing: Secondary | ICD-10-CM

## 2022-06-17 NOTE — Progress Notes (Signed)
$'@Patient'c$  ID: Natalie Fowler, female    DOB: Aug 10, 1946, 76 y.o.   MRN: [id number]  Chief Complaint  Patient presents with   Follow-up    Pt is here for follow up for abnormal lung sounds. Pt states she did not take the Adviar due to all the side effects of the medication.     Referring provider: Darreld Mclean, MD  HPI:   76 y.o. woman whom we are seeing in follow-up for evaluation of wheeze.  Multiple telephone encounters reviewed.  Wheeze or whistling has improved.  At last visit there was some concern for tracheobronchomalacia given description at night when lying supine.  CT high-resolution was obtained that showed no evidence of tracheobronchomalacia on expiratory films on my interpretation.  This was relayed to her.  Albuterol helps so Advair was prescribed at last visit given concern for asthma.  She did not start the Advair.  She states she is worried about her kidneys.  I discussed at length that I know of no side effect or adverse reaction related to the kidneys with Advair use.  She says she is read the side effects.  I pulled up adverse reactions on clinical key with no description of kidney issues.  She is pretty adamant she does not want to use anything "stronger" than her current medications.  She uses albuterol nebulized and via inhaler as needed for wheeze.  Again frequency is greatly improved without intervention.  When she does feel wheezy, the albuterol does help.  HPI at initial visit: Patient was usual state of health.  Had knee replacement summer 2021.  Shortly thereafter developed pneumonia per report.  Chest x-ray 04/2020 reviewed with on my interpretation what appears to be faint left-sided infiltrate near cardiac apex.  Repeat chest x-ray 05/2020 with resolution of infiltrate on my review and interpretation.  However, she started wheezing thereafter.  Worse in the evenings.  Around 6:00.  Further worsens when lying supine or semirecumbent.  Bothers her, while the  wheeze.  She hears until she eventually falls asleep.  Then throughout the day, wheeze not as prominent.  And again worsens in the evening.  She is tried multiple inhalers.  Arnuity in the past without improvement.  Recently started Spiriva without improvement.  Has been using albuterol and reports intermittent improvement in symptoms.  Not reliably improved with every use.  She denies any atopic symptoms.  No history of seasonal allergies.  No history of asthma.  No history of breathing issues.  She has no dyspnea.  Most recent chest x-ray 08/2021 reviewed and interpreted as clear lungs bilaterally.  PMH: Diabetes, sleep apnea Surgical tree: Gallbladder surgery, appendectomy, bilateral knee replacement Family: Sister and father history of cancer Social history: Never smoker, retired from Adult nurse, lives in [place] / Pulmonary Flowsheets:   ACT:      No data to display           MMRC:     No data to display           Epworth:      No data to display           Tests:   FENO:  No results found for: "NITRICOXIDE"  PFT:     No data to display           WALK:      No data to display           Imaging: Personally reviewed and as per  EMR and discussion this note No results found.  Lab Results: Personally reviewed CBC    Component Value Date/Time   WBC 9.3 01/21/2022 1349   RBC 4.82 01/21/2022 1349   HGB 14.2 01/21/2022 1349   HCT 42.8 01/21/2022 1349   PLT 181.0 01/21/2022 1349   MCV 88.7 01/21/2022 1349   MCH 29.8 06/04/2021 0322   MCHC 33.1 01/21/2022 1349   RDW 13.3 01/21/2022 1349    BMET    Component Value Date/Time   NA 140 01/21/2022 1349   NA 140 10/02/2019 0000   K 4.0 01/21/2022 1349   CL 104 01/21/2022 1349   CO2 27 01/21/2022 1349   GLUCOSE 96 01/21/2022 1349   BUN 16 01/21/2022 1349   BUN 12 10/02/2019 0000   CREATININE 0.93 01/21/2022 1349   CREATININE 0.84 09/26/2020 0911   CALCIUM 9.7  01/21/2022 1349   GFRNONAA 57 (L) 06/04/2021 0322    BNP No results found for: "BNP"  ProBNP No results found for: "PROBNP"  Specialty Problems   None   Allergies  Allergen Reactions   Vioxx [Rofecoxib] Rash    Immunization History  Administered Date(s) Administered   Fluad Quad(high Dose 65+) 09/26/2020, 09/11/2021   PFIZER(Purple Top)SARS-COV-2 Vaccination 12/29/2019, 01/23/2020, 08/25/2020   Pfizer Covid-19 Vaccine Bivalent Booster 5y-11y 09/15/2021   Pneumococcal Conjugate-13 08/28/2014   Pneumococcal Polysaccharide-23 09/16/2015   Td 08/28/2014   Tdap 09/21/2013   Zoster Recombinat (Shingrix) 11/07/2018, 03/08/2019    Past Medical History:  Diagnosis Date   Acid reflux    Arthritis    [name] tumor    benign   Osteopenia    Ovarian cyst, bilateral    Pneumonia    PONV (postoperative nausea and vomiting)    Pre-diabetes    Vertigo    Vitamin D deficiency     Tobacco History: Social History   Tobacco Use  Smoking Status Never  Smokeless Tobacco Never   Counseling given: Not Answered   Continue to not smoke  Outpatient Encounter Medications as of 06/17/2022  Medication Sig   cholecalciferol (VITAMIN D) 1000 units tablet Take 1,000 Units by mouth daily.   metFORMIN (GLUCOPHAGE) 500 MG tablet Take 1 tablet (500 mg total) by mouth daily.   raloxifene (EVISTA) 60 MG tablet Take 1 tablet (60 mg total) by mouth daily.   [DISCONTINUED] albuterol (VENTOLIN HFA) 108 (90 Base) MCG/ACT inhaler Inhale 2 puffs into the lungs every 6 (six) hours as needed for wheezing or shortness of breath. (Patient not taking: Reported on 06/17/2022)   [DISCONTINUED] fluticasone-salmeterol (ADVAIR) 500-50 MCG/ACT AEPB Inhale 1 puff into the lungs in the morning and at bedtime. (Patient not taking: Reported on 06/17/2022)   [DISCONTINUED] lisinopril (ZESTRIL) 5 MG tablet Take 1 tablet (5 mg total) by mouth daily. (Patient not taking: Reported on 06/17/2022)   [DISCONTINUED]  omeprazole (PRILOSEC) 40 MG capsule TAKE 1 CAPSULE BY MOUTH DAILY. TAKE FOR 2-4 WEEKS FOR REFLUX. (Patient not taking: Reported on 06/17/2022)   No facility-administered encounter medications on file as of 06/17/2022.     Review of Systems  Review of Systems  No chest pain with exertion.  No orthopnea or PND.  No lower extreme swelling.  Comprehensive review of systems otherwise negative. Physical Exam  BP 126/68 (BP Location: Left Arm, Patient Position: Sitting, Cuff Size: Normal)   Pulse 84   Ht 5' (1.524 m)   Wt 167 lb 3.2 oz (75.8 kg)   SpO2 98%   BMI 32.65 kg/m  Wt Readings from Last 5 Encounters:  06/17/22 167 lb 3.2 oz (75.8 kg)  04/07/22 172 lb (78 kg)  03/12/22 166 lb 6.4 oz (75.5 kg)  01/21/22 169 lb 9.6 oz (76.9 kg)  11/06/21 168 lb (76.2 kg)    BMI Readings from Last 5 Encounters:  06/17/22 32.65 kg/m  04/07/22 33.59 kg/m  03/12/22 32.23 kg/m  01/21/22 32.85 kg/m  11/06/21 32.54 kg/m     Physical Exam General: Well-appearing, sitting in chair Eyes: EOMI, no icterus Neck: Supple, no JVP Pulmonary: celar, no wheeze, otherwise clear, no stridor Cardiovascular: Regular rhythm, no murmur Abdomen: Nondistended, bowel sounds present MSK: No synovitis, joint effusion Neuro: Normal gait, no weakness Psych: Normal mood, full affect   Assessment & Plan:    Wheeze: Present for couple of years.  Seems triggered after knee replacement, possible pneumonia.  Chest x-ray 04/27/2020 with possible left-sided infiltrate near the cardiac apex on chest x-ray on my review and interpretation.  Repeat chest x-ray 05/2020 with clear lungs.  Most recent chest x-ray 08/2021 reviewed interpreted as clear lungs.  Suspect asthma triggered by inflammatory response to possible pneumonia.  However she denies atopic symptoms.  Symptoms do improve with albuterol.  Given worsening in the evenings asthma is even more highly suspected.  Some concern for tracheobronchomalacia given worsening  lie supine.  CT high-res is effectively ruled this out.  Did not improve with LAMA therapy.  Escalate ICS/LABA therapy given concern for asthma.  She did not take this given concern of side effects specifically the kidney.  Fortunately symptoms have improved.  Continue albuterol as needed.  Would recommend ICS/LABA therapy if worsens in the future.  Cough: Intermittent.  Occasion produces thick clear sputum.  In association with the wheezing does very suspicion for inflammatory condition such as asthma.  Cough seems to improved as well.  Return in about 6 months (around 12/18/2022).   Lanier Clam, MD 06/17/2022

## 2022-06-17 NOTE — Patient Instructions (Signed)
Nice to see you again  I am glad your symptoms have improved  Continue the albuterol either nebulized or the inhaler as needed for wheeze or whistling in the lungs  Your CT scan was clear it is good news  Follow-up in 6 months or sooner as needed with Dr. Silas Flood

## 2022-06-23 ENCOUNTER — Encounter: Payer: Self-pay | Admitting: Family Medicine

## 2022-06-24 ENCOUNTER — Encounter: Payer: Self-pay | Admitting: Family Medicine

## 2022-06-24 ENCOUNTER — Ambulatory Visit (INDEPENDENT_AMBULATORY_CARE_PROVIDER_SITE_OTHER): Payer: PPO | Admitting: Family Medicine

## 2022-06-24 VITALS — BP 122/60 | HR 98 | Temp 97.7°F | Resp 18 | Ht 60.25 in | Wt 167.0 lb

## 2022-06-24 DIAGNOSIS — L299 Pruritus, unspecified: Secondary | ICD-10-CM | POA: Diagnosis not present

## 2022-06-24 DIAGNOSIS — R238 Other skin changes: Secondary | ICD-10-CM

## 2022-06-24 MED ORDER — HYDROCORTISONE-ACETIC ACID 1-2 % OT SOLN: 3.0000 [drp] | Freq: Three times a day (TID) | OTIC | 0 refills | Status: DC

## 2022-06-24 MED ORDER — CEPHALEXIN 500 MG PO CAPS: 500.0000 mg | ORAL_CAPSULE | Freq: Three times a day (TID) | ORAL | 0 refills | Status: DC

## 2022-06-24 NOTE — Patient Instructions (Signed)
Good to see you!  Keep your dermatology appt, but I am hopeful the bump on your nose is just a pimple Let's have you use keflex three times a day for 5- 7 days  If not gone have dermatology look at it For itchy ears, try the drops as needed   Take care

## 2022-06-24 NOTE — Progress Notes (Addendum)
[facility] [address], [place], [place] [address] [phone]  Date:  06/24/2022   Name:  Natalie Fowler   DOB:  22-Apr-1946   MRN:  [id number]  PCP:  Darreld Mclean, MD    Chief Complaint: skin issue (Pt c/o a pimple on her nose that will not heal./Concerns/ questions: itchy ears)   History of Present Illness:  Natalie Fowler is a 76 y.o. 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   Dyslipidemia 05/02/2021   Diet-controlled diabetes mellitus ([place]) 01/20/2021   Low back pain 11/19/2016   History of vitamin D deficiency 09/30/2016   H/O colonoscopy with polypectomy 09/30/2015   Vaginal atrophy 09/25/2014   Vertigo    Osteopenia    Acid reflux     Past Medical History:  Diagnosis Date   Acid reflux    Arthritis    [name] tumor    benign   Osteopenia    Ovarian cyst, bilateral    Pneumonia    PONV (postoperative nausea and vomiting)    Pre-diabetes    Vertigo    Vitamin D deficiency     Past Surgical History:  Procedure Laterality Date   APPENDECTOMY     CHOLECYSTECTOMY     CYST EXCISION     CHEST    JOINT REPLACEMENT     OOPHORECTOMY  10/23/2006   DIAG LAP WITH BSO   PELVIC LAPAROSCOPY  10/23/2006   DIAG LAP WITH BSO   ROTATOR CUFF REPAIR     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   Procedure: TOTAL KNEE ARTHROPLASTY;  Surgeon: Paralee Cancel, MD;  Location: [facility];  Service:  Orthopedics;  Laterality: Left;    Social History   Tobacco Use   Smoking status: Never   Smokeless tobacco: Never  Vaping Use   Vaping Use: Never used  Substance Use Topics   Alcohol use: No    Alcohol/week: 0.0 standard drinks of alcohol   Drug use: Never    Family History  Problem Relation Age of Onset   Diabetes Mother    Hypertension Mother    Cancer Father        COLON CA   Osteoporosis Sister    Cancer Sister 65       PANCREATIC   Cancer Maternal Aunt        OVARIAN CANCER   Heart disease Maternal Aunt    Hypertension Daughter    Cancer Paternal Uncle        Colon cancer   Breast cancer Neg Hx     Allergies  Allergen Reactions   Vioxx [Rofecoxib] Rash    Medication list has been reviewed and updated.  Current Outpatient Medications on File Prior to Visit  Medication Sig Dispense Refill   aspirin 81 MG chewable tablet Chew by mouth daily.     cholecalciferol (VITAMIN  D) 1000 units tablet Take 1,000 Units by mouth daily.     metFORMIN (GLUCOPHAGE) 500 MG tablet Take 1 tablet (500 mg total) by mouth daily. 90 tablet 3   raloxifene (EVISTA) 60 MG tablet Take 1 tablet (60 mg total) by mouth daily. 90 tablet 3   No current facility-administered medications on file prior to visit.    Review of Systems:  As per HPI- otherwise negative.   Physical Examination: Vitals:   06/24/22 1301  BP: 122/60  Pulse: 98  Resp: 18  Temp: 97.7 F (36.5 C)  SpO2: 100%   Vitals:   06/24/22 1301  Weight: 167 lb (75.8 kg)  Height: 5' 0.25" (1.53 m)   Body mass index is 32.34 kg/m. Ideal Body Weight: Weight in (lb) to have BMI = 25: 128.8  GEN: no acute distress.  Mildly obese, looks well HEENT: Atraumatic, Normocephalic.  Ears and Nose: No external deformity. CV: RRR, No M/G/R. No JVD. No thrill. No extra heart sounds. PULM: CTA B, no wheezes, crackles, rhonchi. No retractions. No resp. distress. No accessory muscle use. EXTR: No c/c/e PSYCH: Normally  interactive. Conversant.  There is what appears to be a possible resolving pimple on the right side of the patient's nose.  It is mildly tender and inflamed.  Inside the nasal cavity is normal Patient also notes that her ear canals tend to be itchy.  On exam the ear canals appear slightly moist with a small amount of debris.  TM are normal Assessment and Plan: Skin pimple - Plan: cephALEXin (KEFLEX) 500 MG capsule  Itching of ear - Plan: acetic acid-hydrocortisone (VOSOL-HC) OTIC solution Patient seen today with skin concern. Suspect that the lesion on her nose does not represent cancer, though she does have a dermatology visit pending We will treat with Keflex Provided acetic acid/hydrocortisone drops for her to use in her ears as needed  Signed Lamar Blinks, MD

## 2022-06-25 MED ORDER — OFLOXACIN 0.3 % OT SOLN: 5.0000 [drp] | Freq: Every day | OTIC | 0 refills | Status: DC

## 2022-07-06 ENCOUNTER — Telehealth: Payer: Self-pay | Admitting: Family Medicine

## 2022-07-06 NOTE — Telephone Encounter (Signed)
Triage nurse called to transfer pt to schedule appt.    Patient stated she was having dizziness and she thinks its coming from her ears-she has pressure in both ears and headache.  Offered appt with another provider for sooner appt but pt declined.  Pt scheduled for Wed at 1140am with Dr. Lorelei Pont.

## 2022-07-06 NOTE — Telephone Encounter (Signed)
Pt wanted to make an appt for dizziness, transferred to triage.

## 2022-07-08 ENCOUNTER — Encounter: Payer: Self-pay | Admitting: Family Medicine

## 2022-07-08 ENCOUNTER — Ambulatory Visit (INDEPENDENT_AMBULATORY_CARE_PROVIDER_SITE_OTHER): Payer: PPO | Admitting: Family Medicine

## 2022-07-08 VITALS — BP 146/59 | HR 87 | Temp 98.1°F | Resp 16 | Wt 166.0 lb

## 2022-07-08 DIAGNOSIS — E785 Hyperlipidemia, unspecified: Secondary | ICD-10-CM

## 2022-07-08 DIAGNOSIS — E119 Type 2 diabetes mellitus without complications: Secondary | ICD-10-CM

## 2022-07-08 DIAGNOSIS — H8111 Benign paroxysmal vertigo, right ear: Secondary | ICD-10-CM | POA: Diagnosis not present

## 2022-07-08 LAB — BASIC METABOLIC PANEL
BUN: 13 mg/dL (ref 6–23)
CO2: 29 mEq/L (ref 19–32)
Calcium: 9.5 mg/dL (ref 8.4–10.5)
Chloride: 101 mEq/L (ref 96–112)
Creatinine, Ser: 0.84 mg/dL (ref 0.40–1.20)
GFR: 68.51 mL/min (ref >60.00)
Glucose, Bld: 113 mg/dL — ABNORMAL HIGH (ref 70–99)
Potassium: 4.2 mEq/L (ref 3.5–5.1)
Sodium: 138 mEq/L (ref 135–145)

## 2022-07-08 LAB — LIPID PANEL
Cholesterol: 227 mg/dL — ABNORMAL HIGH (ref 0–200)
HDL: 41.1 mg/dL (ref >39.00)
NonHDL: 186.35
Total CHOL/HDL Ratio: 6
Triglycerides: 289 mg/dL — ABNORMAL HIGH (ref 0.0–149.0)
VLDL: 57.8 mg/dL — ABNORMAL HIGH (ref 0.0–40.0)

## 2022-07-08 LAB — HEMOGLOBIN A1C: Hgb A1c MFr Bld: 7 % — ABNORMAL HIGH (ref 4.6–6.5)

## 2022-07-08 LAB — LDL CHOLESTEROL, DIRECT: Direct LDL: 156 mg/dL

## 2022-07-08 MED ORDER — MECLIZINE HCL 25 MG PO TABS: 25.0000 mg | ORAL_TABLET | Freq: Three times a day (TID) | ORAL | 0 refills | Status: DC | PRN

## 2022-07-08 NOTE — Progress Notes (Addendum)
[place] at [facility] [address], [place], [place] [address] [phone]  Date:  07/08/2022   Name:  Natalie Fowler   DOB:  1946-11-21   MRN:  [id number]  PCP:  Darreld Mclean, MD    Chief Complaint: No chief complaint on file.   History of Present Illness:  Natalie Fowler is a 76 y.o. very pleasant female patient who presents with the following:  Patient seen today with concern of illness- history of diet controlled DM, dyslipidemia Lab Results  Component Value Date   HGBA1C 7.2 (H) 01/21/2022   Seen here about 2 weeks ago with a skin concern - I also gave her some floxin otic drops due to concern of ear itching (her insurance would not cover hydrocortisone drops)   Today Natalie Fowler notes that she is feeling dizzy and has some headache This past Sunday she tried a qtip in her right ear and the cotton got stuck in her ear-she is not sure if it is still in there When questioned, she describes her dizziness as vertigo as opposed to lightheadedness It will come and go, started this past Friday- today is wednesday Her ears feel like they have increased pressure but they are not painful She will have vertigo if she rolls over in bed or otherwise moves her head Vertigo will resolve quickly once she is still No vomiting No tinnitus She did vomit once with the dizziness on Friday      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   Dyslipidemia 05/02/2021   Diet-controlled diabetes mellitus (Natalie Fowler) 01/20/2021   Low back pain 11/19/2016   History of vitamin D deficiency 09/30/2016   H/O colonoscopy with polypectomy 09/30/2015   Vaginal atrophy 09/25/2014   Vertigo    Osteopenia    Acid reflux     Past Medical History:  Diagnosis Date   Acid reflux    Arthritis    Natalie Fowler tumor    benign   Osteopenia    Ovarian cyst, bilateral    Pneumonia    PONV (postoperative  nausea and vomiting)    Pre-diabetes    Vertigo    Vitamin D deficiency     Past Surgical History:  Procedure Laterality Date   APPENDECTOMY     CHOLECYSTECTOMY     CYST EXCISION     CHEST    JOINT REPLACEMENT     OOPHORECTOMY  10/23/2006   DIAG LAP WITH BSO   PELVIC LAPAROSCOPY  10/23/2006   DIAG LAP WITH BSO   ROTATOR CUFF REPAIR     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   Procedure: TOTAL KNEE ARTHROPLASTY;  Surgeon: Paralee Cancel, MD;  Location: [facility];  Service: Orthopedics;  Laterality: Left;    Social History   Tobacco Use   Smoking status: Never   Smokeless tobacco: Never  Vaping Use   Vaping Use: Never used  Substance Use Topics   Alcohol use: No    Alcohol/week: 0.0 standard drinks of alcohol   Drug use: Never    Family History  Problem Relation Age of Onset   Diabetes Mother    Hypertension Mother    Cancer Father        COLON CA   Osteoporosis Sister    Cancer Sister 59       PANCREATIC   Cancer Maternal Aunt        OVARIAN  CANCER   Heart disease Maternal Aunt    Hypertension Daughter    Cancer Paternal Uncle        Colon cancer   Breast cancer Neg Hx     Allergies  Allergen Reactions   Vioxx [Rofecoxib] Rash    Medication list has been reviewed and updated.  Current Outpatient Medications on File Prior to Visit  Medication Sig Dispense Refill   aspirin 81 MG chewable tablet Chew by mouth daily.     cholecalciferol (VITAMIN D) 1000 units tablet Take 1,000 Units by mouth daily.     metFORMIN (GLUCOPHAGE) 500 MG tablet Take 1 tablet (500 mg total) by mouth daily. 90 tablet 3   raloxifene (EVISTA) 60 MG tablet Take 1 tablet (60 mg total) by mouth daily. 90 tablet 3   No current facility-administered medications on file prior to visit.    Review of Systems:  As per HPI- otherwise negative.   Physical Examination: Vitals:   07/08/22 1136 07/08/22 1141  BP: (!) 144/77 (!) 146/59  Pulse: 87   Resp: 16   Temp: 98.1 F (36.7 C)    SpO2: 98%    Vitals:   07/08/22 1136  Weight: 166 lb (75.3 kg)   Body mass index is 32.15 kg/m. Ideal Body Weight:    BP Readings from Last 3 Encounters:  07/08/22 (!) 146/59  06/24/22 122/60  06/17/22 126/68   GEN: no acute distress.  Obese, looks well  HEENT: Atraumatic, Normocephalic.  Bilateral TM wnl, oropharynx normal.  PEERL,EOMI.   Ears and Nose: No external deformity. CV: RRR, No M/G/R. No JVD. No thrill. No extra heart sounds. PULM: CTA B, no wheezes, crackles, rhonchi. No retractions. No resp. distress. No accessory muscle use. ABD: S, NT, ND, +BS. No rebound. No HSM. EXTR: No c/c/e PSYCH: Normally interactive. Conversant.  + dix halpike to the right only  Otherwise neurologic exam is normal including Romberg, sensation and movement of face, sensation and strength of all limbs, deep tendon reflex all limbs  I was not able to see any cotton in the patient's right ear.  She wished to have irrigation to make sure there is not a foreign body, irrigation is performed and tolerated well.  No cotton appears to be retained in the patient's ear.  Assessment and Plan: BPPV (benign paroxysmal positional vertigo), right - Plan: meclizine (ANTIVERT) 25 MG tablet  Dyslipidemia - Plan: Lipid panel  Diet-controlled diabetes mellitus ([name]) - Plan: Basic metabolic panel, Hemoglobin A1c  Patient seen today with symptoms most consistent with BPPV on the right Gave a written handout with instructions for Epley maneuver, prescribed meclizine to use as needed.  Caution regarding sedation Asked to let me know if not improving over the next few days-sooner if worse Other routine labs as above  Signed Lamar Blinks, MD  Received labs as below, message to patient  Results for orders placed or performed in visit on 02/72/53  Basic metabolic panel  Result Value Ref Range   Sodium 138 135 - 145 mEq/L   Potassium 4.2 3.5 - 5.1 mEq/L   Chloride 101 96 - 112 mEq/L   CO2 29 19 - 32  mEq/L   Glucose, Bld 113 (H) 70 - 99 mg/dL   BUN 13 6 - 23 mg/dL   Creatinine, Ser 0.84 0.40 - 1.20 mg/dL   GFR 68.51 >60.00 mL/min   Calcium 9.5 8.4 - 10.5 mg/dL  Hemoglobin A1c  Result Value Ref Range   Hgb A1c MFr Bld  7.0 (H) 4.6 - 6.5 %  Lipid panel  Result Value Ref Range   Cholesterol 227 (H) 0 - 200 mg/dL   Triglycerides 289.0 (H) 0.0 - 149.0 mg/dL   HDL 41.10 >39.00 mg/dL   VLDL 57.8 (H) 0.0 - 40.0 mg/dL   Total CHOL/HDL Ratio 6    NonHDL 186.35   LDL cholesterol, direct  Result Value Ref Range   Direct LDL 156.0 mg/dL

## 2022-07-08 NOTE — Patient Instructions (Addendum)
Good to see you today- I think you have BPPV type vertigo which is making you dizzy I gave you a hand- out for a physical therapy (Epley) maneuver you can try at home to reduce your symptoms  You can use the meclizine as needed for vertigo symptoms as long as it does not make you too drowsy   If you are not feeling better in the next few days please let me know! Sooner if worse or any change in your symptoms

## 2022-07-13 ENCOUNTER — Encounter: Payer: Self-pay | Admitting: Family Medicine

## 2022-07-13 DIAGNOSIS — E785 Hyperlipidemia, unspecified: Secondary | ICD-10-CM

## 2022-07-13 MED ORDER — SIMVASTATIN 20 MG PO TABS: 20.0000 mg | ORAL_TABLET | Freq: Every day | ORAL | 3 refills | Status: DC

## 2022-07-30 ENCOUNTER — Encounter: Payer: Self-pay | Admitting: Family Medicine

## 2022-07-30 DIAGNOSIS — R062 Wheezing: Secondary | ICD-10-CM

## 2022-07-30 MED ORDER — ALBUTEROL SULFATE HFA 108 (90 BASE) MCG/ACT IN AERS: 2.0000 | INHALATION_SPRAY | Freq: Four times a day (QID) | RESPIRATORY_TRACT | 2 refills | Status: DC | PRN

## 2022-07-30 NOTE — Telephone Encounter (Signed)
This looked to be discontinued, okay to reorder?

## 2022-08-24 DIAGNOSIS — C44311 Basal cell carcinoma of skin of nose: Secondary | ICD-10-CM | POA: Diagnosis not present

## 2022-08-24 DIAGNOSIS — D485 Neoplasm of uncertain behavior of skin: Secondary | ICD-10-CM | POA: Diagnosis not present

## 2022-09-03 DIAGNOSIS — C44311 Basal cell carcinoma of skin of nose: Secondary | ICD-10-CM | POA: Diagnosis not present

## 2022-09-04 ENCOUNTER — Other Ambulatory Visit: Payer: Self-pay | Admitting: Family Medicine

## 2022-09-04 DIAGNOSIS — E119 Type 2 diabetes mellitus without complications: Secondary | ICD-10-CM

## 2022-09-08 NOTE — Patient Instructions (Addendum)
It was good to see you again today, I will be in touch with your labs. Recommend getting the latest COVID-vaccine this fall, also consider RSV  We will get labs and ultrasound of your legs today to make sure no evidence of heart trouble or a blood clot  Assuming all is well, please see me in 4 to 6 months

## 2022-09-08 NOTE — Progress Notes (Addendum)
[place] [facility] [address] [place], [place] [address] [phone]  Date:  09/14/2022   Name:  Natalie Fowler   DOB:  [birth year]   MRN:  [id number]  PCP:  Darreld Mclean, MD    Chief Complaint: 6 month follow up (Concerns/ questions: pt says the last wed through Friday she had some terrible indigestion and leg swelling/Flu shot today: yes/Urine MA due today. /)   History of Present Illness:  Natalie Fowler is a 76 y.o. very pleasant female patient who presents with the following:  Patient seen today for periodic follow-up- history of diet controlled DM, dyslipidemia. Most recent visit with myself was in August with dizziness consistent with BPPV  She just had Mohs surgery on her nose for skin cancer; she needed skin graph per her report.  Done 2 weeks ago  Last seek she noted "indigestion on my chest" for about 2 days- this occurred while they were out of town on vacation She [place] used antacids but did not help. This has cleared up now -no current chest discomfort She noted it off and on all day- not exertional Not worse when supine; she propped up to sleep however due to her recent facial surgery Not feeling SOB She also noted her legs were swollen when she got back from a vacation last week- they took a bus trip to [facility] so there was a bit of driving.  This does look better now.  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   Dyslipidemia 05/02/2021   Diet-controlled diabetes mellitus ([place]) 01/20/2021   Low back pain 11/19/2016   History of vitamin D deficiency 09/30/2016   H/O colonoscopy with polypectomy 09/30/2015   Vaginal atrophy 09/25/2014   Vertigo    Osteopenia    Acid reflux     Past Medical History:  Diagnosis Date   Acid reflux    Arthritis    [name] tumor    benign   Osteopenia    Ovarian cyst, bilateral    Pneumonia    PONV (postoperative nausea and vomiting)    Pre-diabetes    Vertigo    Vitamin D deficiency     Past Surgical History:  Procedure Laterality Date   APPENDECTOMY     CHOLECYSTECTOMY     CYST EXCISION     CHEST    JOINT REPLACEMENT     OOPHORECTOMY  10/23/2006   DIAG LAP WITH BSO   PELVIC LAPAROSCOPY  10/23/2006   DIAG LAP WITH BSO   ROTATOR CUFF REPAIR     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   Procedure: TOTAL KNEE ARTHROPLASTY;  Surgeon: Paralee Cancel, MD;  Location: [facility];  Service: Orthopedics;  Laterality: Left;    Social History   Tobacco Use   Smoking status: Never   Smokeless tobacco: Never  Vaping Use   Vaping Use: Never  used  Substance Use Topics   Alcohol use: No    Alcohol/week: 0.0 standard drinks of alcohol   Drug use: Never    Family History  Problem Relation Age of Onset   Diabetes Mother    Hypertension Mother    Cancer Father        COLON CA   Osteoporosis Sister    Cancer Sister 27       PANCREATIC   Cancer Maternal Aunt        OVARIAN CANCER   Heart disease Maternal Aunt    Hypertension Daughter    Cancer Paternal Uncle        Colon cancer   Breast cancer Neg Hx     Allergies  Allergen Reactions   Vioxx [Rofecoxib] Rash    Medication list has been reviewed and updated.  Current Outpatient Medications on File Prior to Visit  Medication Sig Dispense Refill   albuterol (VENTOLIN HFA) 108 (90 Base) MCG/ACT inhaler Inhale 2 puffs into the lungs every 6 (six) hours as  needed for wheezing or shortness of breath. 8 g 2   aspirin 81 MG chewable tablet Chew by mouth daily.     cholecalciferol (VITAMIN D) 1000 units tablet Take 1,000 Units by mouth daily.     meclizine (ANTIVERT) 25 MG tablet Take 1 tablet (25 mg total) by mouth 3 (three) times daily as needed for dizziness. 30 tablet 0   metFORMIN (GLUCOPHAGE) 500 MG tablet Take 1 tablet (500 mg total) by mouth daily. 90 tablet 0   raloxifene (EVISTA) 60 MG tablet Take 1 tablet (60 mg total) by mouth daily. 90 tablet 3   simvastatin (ZOCOR) 20 MG tablet Take 1 tablet (20 mg total) by mouth at bedtime. 90 tablet 3   No current facility-administered medications on file prior to visit.    Review of Systems:  As per HPI- otherwise negative.   Physical Examination: Vitals:   09/14/22 0829  BP: 126/80  Pulse: 60  Resp: 18  Temp: 97.8 F (36.6 C)  SpO2: 97%   Vitals:   09/14/22 0829  Weight: 169 lb 9.6 oz (76.9 kg)  Height: 5' 0.25" (1.53 m)   Body mass index is 32.85 kg/m. Ideal Body Weight: Weight in (lb) to have BMI = 25: 128.8  GEN: no acute distress.  Obese, looks well  HEENT: Atraumatic, Normocephalic.  Bandage on nose from recent Mohs surgery Ears and Nose: No external deformity. CV: RRR, No M/G/R. No JVD. No thrill. No extra heart sounds. PULM: CTA B, no wheezes, crackles, rhonchi. No retractions. No resp. distress. No accessory muscle use. ABD: S, NT, ND, +BS. No rebound. No HSM. EXTR: No c/c/e PSYCH: Normally interactive. Conversant.  Foot exam normal Mild edema of both LE- legs more than feet.  Patient notes this is improved  EKG: SR with low voltage chest leads no acute findings  Compared with 6/22- no worrisome change noted  Assessment and Plan: Dyslipidemia - Plan: Lipid panel  Diet-controlled diabetes mellitus ([place]) - Plan: Microalbumin / creatinine urine ratio  Chest discomfort - Plan: EKG 12-Lead, B Nat Peptide, Troponin I (High Sensitivity)  Lower extremity edema -  Plan: B Nat Peptide, US Venous Img Lower Bilateral (DVT)  Need for influenza vaccination - Plan: Flu Vaccine QUAD High Dose(Fluad)  Pt seen today with concern of recent chest discomfort/indigestion, leg swelling.  May truly be just indigestion due to diet change while on vacation Chest discomfort now resolved, leg swelling is much improved.  We will further evaluate with labs and lower extremity ultrasound as above.  EKG reassuring Flu shot today Signed Lamar Blinks, MD  Received labs and ultrasound, message to patient US Venous Img Lower Bilateral (DVT)  Result Date: 09/14/2022 CLINICAL DATA:  Bilateral lower extremity edema. EXAM: BILATERAL LOWER EXTREMITY VENOUS DOPPLER ULTRASOUND TECHNIQUE: Gray-scale sonography with graded compression, as well as color Doppler and duplex ultrasound were performed to evaluate the lower extremity deep venous systems from the level of the common femoral vein and including the common femoral, femoral, profunda femoral, popliteal and calf veins including the posterior tibial, peroneal and gastrocnemius veins when visible. The superficial great saphenous vein was also interrogated. Spectral Doppler was utilized to evaluate flow at rest and with distal augmentation maneuvers in the common femoral, femoral and popliteal veins. COMPARISON:  None Available. FINDINGS: RIGHT LOWER EXTREMITY Common Femoral Vein: No evidence of thrombus. Normal compressibility, respiratory phasicity and response to augmentation. Saphenofemoral Junction: No evidence of thrombus. Normal compressibility and flow on color Doppler imaging. Profunda Femoral Vein: No evidence of thrombus. Normal compressibility and flow on color Doppler imaging. Femoral Vein: No evidence of thrombus. Normal compressibility, respiratory phasicity and response to augmentation. Popliteal Vein: No evidence of thrombus. Normal compressibility, respiratory phasicity and response to augmentation. Calf Veins: No evidence  of thrombus. Normal compressibility and flow on color Doppler imaging. Superficial Great Saphenous Vein: No evidence of thrombus. Normal compressibility. Venous Reflux:  None. Other Findings: No evidence of superficial thrombophlebitis or abnormal fluid collection. LEFT LOWER EXTREMITY Common Femoral Vein: No evidence of thrombus. Normal compressibility, respiratory phasicity and response to augmentation. Saphenofemoral Junction: No evidence of thrombus. Normal compressibility and flow on color Doppler imaging. Profunda Femoral Vein: No evidence of thrombus. Normal compressibility and flow on color Doppler imaging. Femoral Vein: No evidence of thrombus. Normal compressibility, respiratory phasicity and response to augmentation. Popliteal Vein: No evidence of thrombus. Normal compressibility, respiratory phasicity and response to augmentation. Calf Veins: No evidence of thrombus. Normal compressibility and flow on color Doppler imaging. Superficial Great Saphenous Vein: No evidence of thrombus. Normal compressibility. Venous Reflux:  None. Other Findings: No evidence of superficial thrombophlebitis or abnormal fluid collection. IMPRESSION: No evidence of deep venous thrombosis in either lower extremity. Electronically Signed   By: Aletta Edouard M.D.   On: 09/14/2022 10:12    Results for orders placed or performed in visit on 09/14/22  Microalbumin / creatinine urine ratio  Result Value Ref Range   Microalb, Ur 2.2 (H) 0.0 - 1.9 mg/dL   Creatinine,U 121.3 mg/dL   Microalb Creat Ratio 1.8 0.0 - 30.0 mg/g  Lipid panel  Result Value Ref Range   Cholesterol 149 0 - 200 mg/dL   Triglycerides 171.0 (H) 0.0 - 149.0 mg/dL   HDL 40.40 >39.00 mg/dL   VLDL 34.2 0.0 - 40.0 mg/dL   LDL Cholesterol 74 0 - 99 mg/dL   Total CHOL/HDL Ratio 4    NonHDL 108.45   B Nat Peptide  Result Value Ref Range   Pro B Natriuretic peptide (BNP) 8.0 0.0 - 100.0 pg/mL  Troponin I (High Sensitivity)  Result Value Ref Range    High Sens Troponin I 3 2 - 17 ng/L

## 2022-09-14 ENCOUNTER — Ambulatory Visit (INDEPENDENT_AMBULATORY_CARE_PROVIDER_SITE_OTHER): Payer: PPO | Admitting: Family Medicine

## 2022-09-14 ENCOUNTER — Ambulatory Visit: Payer: PPO

## 2022-09-14 ENCOUNTER — Ambulatory Visit (HOSPITAL_BASED_OUTPATIENT_CLINIC_OR_DEPARTMENT_OTHER)
Admission: RE | Admit: 2022-09-14 | Discharge: 2022-09-14 | Disposition: A | Discharge status: Home / Self Care | Payer: PPO | Source: Ambulatory Visit | Attending: Family Medicine | Admitting: Family Medicine

## 2022-09-14 ENCOUNTER — Encounter: Payer: Self-pay | Admitting: Family Medicine

## 2022-09-14 VITALS — BP 126/80 | HR 60 | Temp 97.8°F | Resp 18 | Ht 60.25 in | Wt 169.6 lb

## 2022-09-14 DIAGNOSIS — Z23 Encounter for immunization: Secondary | ICD-10-CM | POA: Diagnosis not present

## 2022-09-14 DIAGNOSIS — R0789 Other chest pain: Secondary | ICD-10-CM

## 2022-09-14 DIAGNOSIS — E119 Type 2 diabetes mellitus without complications: Secondary | ICD-10-CM

## 2022-09-14 DIAGNOSIS — E785 Hyperlipidemia, unspecified: Secondary | ICD-10-CM

## 2022-09-14 DIAGNOSIS — R600 Localized edema: Secondary | ICD-10-CM

## 2022-09-14 LAB — LIPID PANEL
Cholesterol: 149 mg/dL (ref 0–200)
HDL: 40.4 mg/dL (ref >39.00)
LDL Cholesterol: 74 mg/dL (ref 0–99)
NonHDL: 108.45
Total CHOL/HDL Ratio: 4
Triglycerides: 171 mg/dL — ABNORMAL HIGH (ref 0.0–149.0)
VLDL: 34.2 mg/dL (ref 0.0–40.0)

## 2022-09-14 LAB — TROPONIN I (HIGH SENSITIVITY): High Sens Troponin I: 3 ng/L (ref 2–17)

## 2022-09-14 LAB — MICROALBUMIN / CREATININE URINE RATIO
Creatinine,U: 121.3 mg/dL
Microalb Creat Ratio: 1.8 mg/g (ref 0.0–30.0)
Microalb, Ur: 2.2 mg/dL — ABNORMAL HIGH (ref 0.0–1.9)

## 2022-09-14 LAB — BRAIN NATRIURETIC PEPTIDE: Pro B Natriuretic peptide (BNP): 8 pg/mL (ref 0.0–100.0)

## 2022-09-15 ENCOUNTER — Ambulatory Visit (INDEPENDENT_AMBULATORY_CARE_PROVIDER_SITE_OTHER): Payer: PPO | Admitting: *Deleted

## 2022-09-15 DIAGNOSIS — Z0000 Encounter for general adult medical examination without abnormal findings: Secondary | ICD-10-CM | POA: Diagnosis not present

## 2022-09-15 NOTE — Progress Notes (Signed)
Subjective:   Natalie Fowler is a 76 y.o. female who presents for Medicare Annual (Subsequent) preventive examination. I connected with  Francisco Capuchin on 09/15/22 by a audio enabled telemedicine application and verified that I am speaking with the correct person using two identifiers.  Patient Location: Home  Provider Location: Office/Clinic  I discussed the limitations of evaluation and management by telemedicine. The patient expressed understanding and agreed to proceed.   Review of Systems    Defer to PCP Cardiac Risk Factors include: diabetes mellitus;dyslipidemia;advanced age (>26mn, >>56women)     Objective:    There were no vitals filed for this visit. There is no height or weight on file to calculate BMI.     09/15/2022    8:22 AM 09/09/2021    9:07 AM 06/03/2021   12:45 PM 05/28/2021    9:09 AM  Advanced Directives  Does Patient Have a Medical Advance Directive? Yes Yes Yes Yes  Type of [place] will [place] will Living will  Does patient want to make changes to medical advance directive? No - Patient declined  No - Guardian declined   Copy of [place] Chart? No - copy requested No - copy requested      Current Medications (verified) Outpatient Encounter Medications as of 09/15/2022  Medication Sig   albuterol (VENTOLIN HFA) 108 (90 Base) MCG/ACT inhaler Inhale 2 puffs into the lungs every 6 (six) hours as needed for wheezing or shortness of breath.   aspirin 81 MG chewable tablet Chew by mouth daily.   cholecalciferol (VITAMIN D) 1000 units tablet Take 1,000 Units by mouth daily.   meclizine (ANTIVERT) 25 MG tablet Take 1 tablet (25 mg total) by mouth 3 (three) times daily as needed for dizziness.   metFORMIN (GLUCOPHAGE) 500 MG tablet Take 1 tablet (500 mg total) by mouth daily.   raloxifene (EVISTA) 60 MG tablet Take 1 tablet  (60 mg total) by mouth daily.   simvastatin (ZOCOR) 20 MG tablet Take 1 tablet (20 mg total) by mouth at bedtime.   No facility-administered encounter medications on file as of 09/15/2022.    Allergies (verified) Vioxx [rofecoxib]   History: Past Medical History:  Diagnosis Date   Acid reflux    Arthritis    Brenner tumor    benign   Osteopenia    Ovarian cyst, bilateral    Pneumonia    PONV (postoperative nausea and vomiting)    Pre-diabetes    Vertigo    Vitamin D deficiency    Past Surgical History:  Procedure Laterality Date   APPENDECTOMY     CHOLECYSTECTOMY     CYST EXCISION     CHEST    JOINT REPLACEMENT     OOPHORECTOMY  10/23/2006   DIAG LAP WITH BSO   PELVIC LAPAROSCOPY  10/23/2006   DIAG LAP WITH BSO   ROTATOR CUFF REPAIR     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   Procedure: TOTAL KNEE ARTHROPLASTY;  Surgeon: OParalee Cancel MD;  Location: [facility];  Service: Orthopedics;  Laterality: Left;   Family History  Problem Relation Age of Onset   Diabetes Mother    Hypertension Mother    Cancer Father        COLON CA   Osteoporosis Sister    Cancer Sister [phone]      PANCREATIC   Cancer Maternal Aunt  OVARIAN CANCER   Heart disease Maternal Aunt    Hypertension Daughter    Cancer Paternal Uncle        Colon cancer   Breast cancer Neg Hx    Social History   Socioeconomic History   Marital status: Married    Spouse name: Not on file   Number of children: Not on file   Years of education: Not on file   Highest education level: Not on file  Occupational History   Not on file  Tobacco Use   Smoking status: Never   Smokeless tobacco: Never  Vaping Use   Vaping Use: Never used  Substance and Sexual Activity   Alcohol use: No    Alcohol/week: 0.0 standard drinks of alcohol   Drug use: Never   Sexual activity: Yes    Partners: Male    Birth control/protection: Post-menopausal    Comment: 1st intercourse- 24, partners- 52, married- 2 yrs    Other Topics Concern   Not on file  Social History Narrative   Not on file   Social Determinants of Health   Financial Resource Strain: Low Risk  (09/09/2021)   Overall Financial Resource Strain (CARDIA)    Difficulty of Paying Living Expenses: Not hard at all  Food Insecurity: No Food Insecurity (09/09/2021)   Hunger Vital Sign    Worried About Running Out of Food in the Last Year: Never true    Painter in the Last Year: Never true  Transportation Needs: No Transportation Needs (09/09/2021)   PRAPARE - Hydrologist (Medical): No    Lack of Transportation (Non-Medical): No  Physical Activity: Sufficiently Active (09/09/2021)   Exercise Vital Sign    Days of Exercise per Week: 7 days    Minutes of Exercise per Session: 30 min  Stress: No Stress Concern Present (09/09/2021)   [place]    Feeling of Stress : Not at all  Social Connections: Moderately Integrated (09/09/2021)   Social Connection and Isolation Panel [NHANES]    Frequency of Communication with Friends and Family: More than three times a week    Frequency of Social Gatherings with Friends and Family: More than three times a week    Attends Religious Services: Never    Marine scientist or Organizations: Yes    Attends Music therapist: More than 4 times per year    Marital Status: Married    Tobacco Counseling Counseling given: Not Answered   Clinical Intake:  Pre-visit preparation completed: Yes  Pain : No/denies pain  How often do you need to have someone help you when you read instructions, pamphlets, or other written materials from your doctor or pharmacy?: 1 - Never  Diabetic? Yes Nutrition Risk Assessment:  Has the patient had any N/V/D within the last 2 months?  No  Does the patient have any non-healing wounds?  No  Has the patient had any unintentional weight loss or weight  gain?  No   Diabetes:  Is the patient diabetic?  Yes  If diabetic, was a CBG obtained today?  No  Did the patient bring in their glucometer from home?   Audio visit How often do you monitor your CBG's? never.   Financial Strains and Diabetes Management:  Are you having any financial strains with the device, your supplies or your medication? No .  Does the patient want to be seen by Chronic Care  Management for management of their diabetes?  No  Would the patient like to be referred to a Nutritionist or for Diabetic Management?  No   Diabetic Exams:  Diabetic Eye Exam: Completed 11/11/21 Diabetic Foot Exam: Completed 09/14/22    Interpreter Needed?: No  Information entered by :: Beatris Ship, [place]   Activities of Daily Living    09/15/2022    8:23 AM  In your present state of health, do you have any difficulty performing the following activities:  Hearing? 0  Vision? 0  Difficulty concentrating or making decisions? 0  Walking or climbing stairs? 0  Dressing or bathing? 0  Doing errands, shopping? 0  Preparing Food and eating ? N  Using the Toilet? N  In the past six months, have you accidently leaked urine? N  Do you have problems with loss of bowel control? N  Managing your Medications? N  Managing your Finances? N  Housekeeping or managing your Housekeeping? N    Patient Care Team: Copland, Gay Filler, MD as PCP - General (Family Medicine)  Indicate any recent Medical Services you may have received from other than [facility] providers in the past year (date may be approximate).     Assessment:   This is a routine wellness examination for [place].  Hearing/Vision screen No results found.  Dietary issues and exercise activities discussed: Current Exercise Habits: The patient does not participate in regular exercise at present, Exercise limited by: None identified   Goals Addressed   None    Depression Screen    09/15/2022    8:21 AM 09/14/2022    8:40 AM  09/09/2021    9:11 AM 05/07/2021    1:13 PM 01/20/2021    9:35 AM  PHQ 2/9 Scores  PHQ - 2 Score 0 0 0 0 0    Fall Risk    09/15/2022    8:21 AM 09/14/2022    8:40 AM 09/09/2021    9:10 AM 05/07/2021    1:13 PM  Fall Risk   Falls in the past year? 0 0 0 0  Number falls in past yr: 0 0 0   Injury with Fall? 0 0 0   Risk for fall due to : No Fall Risks     Follow up Falls evaluation completed Falls evaluation completed Falls prevention discussed     [place]:  Any stairs in or around the home? Yes  If so, are there any without handrails? No  Home free of loose throw rugs in walkways, pet beds, electrical cords, etc? Yes  Adequate lighting in your home to reduce risk of falls? Yes   ASSISTIVE DEVICES UTILIZED TO PREVENT FALLS:  Life alert? No  Use of a cane, walker or w/c? No  Grab bars in the bathroom? Yes  Shower chair or bench in shower? Yes  Elevated toilet seat or a handicapped toilet? No   TIMED UP AND GO:  Was the test performed?  Audio visit .    Cognitive Function:        09/15/2022    8:30 AM  6CIT Screen  What Year? 0 points  What month? 0 points  What time? 0 points  Count back from 20 2 points  Months in reverse 0 points  Repeat phrase 4 points  Total Score 6 points    Immunizations Immunization History  Administered Date(s) Administered   Fluad Quad(high Dose 65+) 09/26/2020, 09/11/2021, 09/14/2022   PFIZER(Purple Top)SARS-COV-2 Vaccination 12/29/2019,  01/23/2020, 08/25/2020, 08/31/2022   Pfizer Covid-19 Vaccine Bivalent Booster 5y-11y 09/15/2021   Pneumococcal Conjugate-13 08/28/2014   Pneumococcal Polysaccharide-23 09/16/2015   Td 08/28/2014   Tdap 09/21/2013   Zoster Recombinat (Shingrix) 11/07/2018, 03/08/2019    TDAP status: Up to date  Flu Vaccine status: Up to date  Pneumococcal vaccine status: Up to date  Covid-19 vaccine status: Information provided on how to obtain vaccines.   Qualifies  for Shingles Vaccine? Yes   Zostavax completed No   Shingrix Completed?: Yes  Screening Tests Health Maintenance  Topic Date Due   Medicare Annual Wellness (AWV)  Never done   OPHTHALMOLOGY EXAM  11/11/2022   COVID-19 Vaccine (5 - Pfizer series) 01/01/2023   HEMOGLOBIN A1C  01/08/2023   Diabetic kidney evaluation - GFR measurement  07/09/2023   Diabetic kidney evaluation - Urine ACR  09/15/2023   FOOT EXAM  09/15/2023   MAMMOGRAM  10/07/2023   TETANUS/TDAP  08/28/2024   COLONOSCOPY (Pts 45-72yr Insurance coverage will need to be confirmed)  09/11/2028   Pneumonia Vaccine 76 Years old  Completed   INFLUENZA VACCINE  Completed   DEXA SCAN  Completed   Hepatitis C Screening  Completed   Zoster Vaccines- Shingrix  Completed   HPV VACCINES  Aged Out    Health Maintenance  Health Maintenance Due  Topic Date Due   Medicare Annual Wellness (AWV)  Never done    Colorectal cancer screening: Type of screening: Colonoscopy. Completed 09/11/18. Repeat every 10 years  Mammogram status: Completed 10/06/21. Repeat every year  Bone Density status: Completed 11/19/20. Results reflect: Bone density results: OSTEOPENIA. Repeat every 2 years.  Lung Cancer Screening: (Low Dose CT Chest recommended if Age 74-80years, 30 pack-year currently smoking OR have quit w/in 15years.) does not qualify.   Lung Cancer Screening Referral: N/a  Additional Screening:  Hepatitis C Screening: does qualify; Completed 09/21/13  Vision Screening: Recommended annual ophthalmology exams for early detection of glaucoma and other disorders of the eye. Is the patient up to date with their annual eye exam?  Yes  Who is the provider or what is the name of the office in which the patient attends annual eye exams? Doesn't remember name of new doctor If pt is not established with a provider, would they like to be referred to a provider to establish care? No .   Dental Screening: Recommended annual dental exams for  proper oral hygiene  Community Resource Referral / Chronic Care Management: CRR required this visit?  No   CCM required this visit?  No      Plan:     I have personally reviewed and noted the following in the patient's chart:   Medical and social history Use of alcohol, tobacco or illicit drugs  Current medications and supplements including opioid prescriptions. Patient is not currently taking opioid prescriptions. Functional ability and status Nutritional status Physical activity Advanced directives List of other physicians Hospitalizations, surgeries, and ER visits in previous 12 months Vitals Screenings to include cognitive, depression, and falls Referrals and appointments  In addition, I have reviewed and discussed with patient certain preventive protocols, quality metrics, and best practice recommendations. A written personalized care plan for preventive services as well as general preventive health recommendations were provided to patient.   Due to this being a telephonic visit, the after visit summary with patients personalized plan was offered to patient via mail or my-chart. Per request, patient was mailed a copy of AVS.  BRebeca Alert Jazzalyn Loewenstein, CMA  09/15/2022   Nurse Notes: None

## 2022-09-15 NOTE — Patient Instructions (Signed)
Natalie Fowler , Thank you for taking time to come for your Medicare Wellness Visit. I appreciate your ongoing commitment to your health goals. Please review the following plan we discussed and let me know if I can assist you in the future.   These are the goals we discussed:  Goals      Patient Stated     Increase activity as tolerated following knee replacement & lose some weight        This is a list of the screening recommended for you and due dates:  Health Maintenance  Topic Date Due   Eye exam for diabetics  11/11/2022   COVID-19 Vaccine (5 - Pfizer series) 01/01/2023   Hemoglobin A1C  01/08/2023   Yearly kidney function blood test for diabetes  07/09/2023   Yearly kidney health urinalysis for diabetes  09/15/2023   Complete foot exam   09/15/2023   Mammogram  10/07/2023   Medicare Annual Wellness Visit  10/16/2023   Tetanus Vaccine  08/28/2024   Colon Cancer Screening  09/11/2028   Pneumonia Vaccine  Completed   Flu Shot  Completed   DEXA scan (bone density measurement)  Completed   Hepatitis C Screening: USPSTF Recommendation to screen - Ages 5-79 yo.  Completed   Zoster (Shingles) Vaccine  Completed   HPV Vaccine  Aged Out     Next appointment: Follow up in one year for your annual wellness visit    Preventive Care 65 Years and Older, Female Preventive care refers to lifestyle choices and visits with your health care provider that can promote health and wellness. What does preventive care include? A yearly physical exam. This is also called an annual well check. Dental exams once or twice a year. Routine eye exams. Ask your health care provider how often you should have your eyes checked. Personal lifestyle choices, including: Daily care of your teeth and gums. Regular physical activity. Eating a healthy diet. Avoiding tobacco and drug use. Limiting alcohol use. Practicing safe sex. Taking low-dose aspirin every day. Taking vitamin and mineral supplements  as recommended by your health care provider. What happens during an annual well check? The services and screenings done by your health care provider during your annual well check will depend on your age, overall health, lifestyle risk factors, and family history of disease. Counseling  Your health care provider may ask you questions about your: Alcohol use. Tobacco use. Drug use. Emotional well-being. Home and relationship well-being. Sexual activity. Eating habits. History of falls. Memory and ability to understand (cognition). Work and work Statistician. Reproductive health. Screening  You may have the following tests or measurements: Height, weight, and BMI. Blood pressure. Lipid and cholesterol levels. These may be checked every 5 years, or more frequently if you are over 66 years old. Skin check. Lung cancer screening. You may have this screening every year starting at age 29 if you have a 30-pack-year history of smoking and currently smoke or have quit within the past 15 years. Fecal occult blood test (FOBT) of the stool. You may have this test every year starting at age 14. Flexible sigmoidoscopy or colonoscopy. You may have a sigmoidoscopy every 5 years or a colonoscopy every 10 years starting at age 3. Hepatitis C blood test. Hepatitis B blood test. Sexually transmitted disease (STD) testing. Diabetes screening. This is done by checking your blood sugar (glucose) after you have not eaten for a while (fasting). You may have this done every 1-3 years. Bone density scan. This  is done to screen for osteoporosis. You may have this done starting at age 60. Mammogram. This may be done every 1-2 years. Talk to your health care provider about how often you should have regular mammograms. Talk with your health care provider about your test results, treatment options, and if necessary, the need for more tests. Vaccines  Your health care provider may recommend certain vaccines, such  as: Influenza vaccine. This is recommended every year. Tetanus, diphtheria, and acellular pertussis (Tdap, Td) vaccine. You may need a Td booster every 10 years. Zoster vaccine. You may need this after age 45. Pneumococcal 13-valent conjugate (PCV13) vaccine. One dose is recommended after age 67. Pneumococcal polysaccharide (PPSV23) vaccine. One dose is recommended after age 27. Talk to your health care provider about which screenings and vaccines you need and how often you need them. This information is not intended to replace advice given to you by your health care provider. Make sure you discuss any questions you have with your health care provider. Document Released: 12/06/2015 Document Revised: 07/29/2016 Document Reviewed: 09/10/2015 Elsevier Interactive Patient Education  2017 [employer] Prevention in the Home Falls can cause injuries. They can happen to people of all ages. There are many things you can do to make your home safe and to help prevent falls. What can I do on the outside of my home? Regularly fix the edges of walkways and driveways and fix any cracks. Remove anything that might make you trip as you walk through a door, such as a raised step or threshold. Trim any bushes or trees on the path to your home. Use bright outdoor lighting. Clear any walking paths of anything that might make someone trip, such as rocks or tools. Regularly check to see if handrails are loose or broken. Make sure that both sides of any steps have handrails. Any raised decks and porches should have guardrails on the edges. Have any leaves, snow, or ice cleared regularly. Use sand or salt on walking paths during winter. Clean up any spills in your garage right away. This includes oil or grease spills. What can I do in the bathroom? Use night lights. Install grab bars by the toilet and in the tub and shower. Do not use towel bars as grab bars. Use non-skid mats or decals in the tub or  shower. If you need to sit down in the shower, use a plastic, non-slip stool. Keep the floor dry. Clean up any water that spills on the floor as soon as it happens. Remove soap buildup in the tub or shower regularly. Attach bath mats securely with double-sided non-slip rug tape. Do not have throw rugs and other things on the floor that can make you trip. What can I do in the bedroom? Use night lights. Make sure that you have a light by your bed that is easy to reach. Do not use any sheets or blankets that are too big for your bed. They should not hang down onto the floor. Have a firm chair that has side arms. You can use this for support while you get dressed. Do not have throw rugs and other things on the floor that can make you trip. What can I do in the kitchen? Clean up any spills right away. Avoid walking on wet floors. Keep items that you use a lot in easy-to-reach places. If you need to reach something above you, use a strong step stool that has a grab bar. Keep electrical cords out  of the way. Do not use floor polish or wax that makes floors slippery. If you must use wax, use non-skid floor wax. Do not have throw rugs and other things on the floor that can make you trip. What can I do with my stairs? Do not leave any items on the stairs. Make sure that there are handrails on both sides of the stairs and use them. Fix handrails that are broken or loose. Make sure that handrails are as long as the stairways. Check any carpeting to make sure that it is firmly attached to the stairs. Fix any carpet that is loose or worn. Avoid having throw rugs at the top or bottom of the stairs. If you do have throw rugs, attach them to the floor with carpet tape. Make sure that you have a light switch at the top of the stairs and the bottom of the stairs. If you do not have them, ask someone to add them for you. What else can I do to help prevent falls? Wear shoes that: Do not have high heels. Have  rubber bottoms. Are comfortable and fit you well. Are closed at the toe. Do not wear sandals. If you use a stepladder: Make sure that it is fully opened. Do not climb a closed stepladder. Make sure that both sides of the stepladder are locked into place. Ask someone to hold it for you, if possible. Clearly mark and make sure that you can see: Any grab bars or handrails. First and last steps. Where the edge of each step is. Use tools that help you move around (mobility aids) if they are needed. These include: Canes. Walkers. Scooters. Crutches. Turn on the lights when you go into a dark area. Replace any light bulbs as soon as they burn out. Set up your furniture so you have a clear path. Avoid moving your furniture around. If any of your floors are uneven, fix them. If there are any pets around you, be aware of where they are. Review your medicines with your doctor. Some medicines can make you feel dizzy. This can increase your chance of falling. Ask your doctor what other things that you can do to help prevent falls. This information is not intended to replace advice given to you by your health care provider. Make sure you discuss any questions you have with your health care provider. Document Released: 09/05/2009 Document Revised: 04/16/2016 Document Reviewed: 12/14/2014 Elsevier Interactive Patient Education  2017 [employer].

## 2022-09-24 ENCOUNTER — Encounter: Payer: Self-pay | Admitting: Family Medicine

## 2022-10-07 ENCOUNTER — Ambulatory Visit: Payer: PPO

## 2022-10-22 DIAGNOSIS — N3010 Interstitial cystitis (chronic) without hematuria: Secondary | ICD-10-CM | POA: Diagnosis not present

## 2022-10-27 DIAGNOSIS — M5451 Vertebrogenic low back pain: Secondary | ICD-10-CM | POA: Diagnosis not present

## 2022-11-04 ENCOUNTER — Ambulatory Visit: Payer: PPO | Admitting: Obstetrics & Gynecology

## 2022-11-06 ENCOUNTER — Ambulatory Visit
Admission: RE | Admit: 2022-11-06 | Discharge: 2022-11-06 | Disposition: A | Discharge status: Home / Self Care | Payer: PPO | Source: Ambulatory Visit | Attending: Family Medicine | Admitting: Family Medicine

## 2022-11-06 DIAGNOSIS — Z1231 Encounter for screening mammogram for malignant neoplasm of breast: Secondary | ICD-10-CM | POA: Diagnosis not present

## 2022-11-10 ENCOUNTER — Other Ambulatory Visit: Payer: Self-pay | Admitting: Family Medicine

## 2022-11-10 DIAGNOSIS — R808 Other proteinuria: Secondary | ICD-10-CM

## 2022-11-11 ENCOUNTER — Encounter: Payer: Self-pay | Admitting: Family Medicine

## 2022-11-11 DIAGNOSIS — R808 Other proteinuria: Secondary | ICD-10-CM

## 2022-11-11 MED ORDER — LISINOPRIL 5 MG PO TABS: 5.0000 mg | ORAL_TABLET | Freq: Every day | ORAL | 3 refills | Status: DC

## 2022-12-10 ENCOUNTER — Other Ambulatory Visit: Payer: Self-pay | Admitting: Family Medicine

## 2022-12-10 DIAGNOSIS — E119 Type 2 diabetes mellitus without complications: Secondary | ICD-10-CM

## 2022-12-29 DIAGNOSIS — K219 Gastro-esophageal reflux disease without esophagitis: Secondary | ICD-10-CM | POA: Diagnosis not present

## 2022-12-29 DIAGNOSIS — R053 Chronic cough: Secondary | ICD-10-CM | POA: Diagnosis not present

## 2022-12-29 DIAGNOSIS — R1013 Epigastric pain: Secondary | ICD-10-CM | POA: Diagnosis not present

## 2022-12-31 DIAGNOSIS — R1013 Epigastric pain: Secondary | ICD-10-CM | POA: Diagnosis not present

## 2022-12-31 DIAGNOSIS — K208 Other esophagitis without bleeding: Secondary | ICD-10-CM | POA: Diagnosis not present

## 2022-12-31 DIAGNOSIS — K449 Diaphragmatic hernia without obstruction or gangrene: Secondary | ICD-10-CM | POA: Diagnosis not present

## 2022-12-31 DIAGNOSIS — R059 Cough, unspecified: Secondary | ICD-10-CM | POA: Diagnosis not present

## 2022-12-31 DIAGNOSIS — K219 Gastro-esophageal reflux disease without esophagitis: Secondary | ICD-10-CM | POA: Diagnosis not present

## 2022-12-31 DIAGNOSIS — K2289 Other specified disease of esophagus: Secondary | ICD-10-CM | POA: Diagnosis not present

## 2022-12-31 DIAGNOSIS — K209 Esophagitis, unspecified without bleeding: Secondary | ICD-10-CM | POA: Diagnosis not present

## 2022-12-31 DIAGNOSIS — K259 Gastric ulcer, unspecified as acute or chronic, without hemorrhage or perforation: Secondary | ICD-10-CM | POA: Diagnosis not present

## 2022-12-31 DIAGNOSIS — K2950 Unspecified chronic gastritis without bleeding: Secondary | ICD-10-CM | POA: Diagnosis not present

## 2022-12-31 DIAGNOSIS — K229 Disease of esophagus, unspecified: Secondary | ICD-10-CM | POA: Diagnosis not present

## 2023-01-09 NOTE — Progress Notes (Unsigned)
[place] at [facility] [address] [place], [place] [address] [phone]  Date:  01/11/2023   Name:  Natalie Fowler   DOB:  Sep 23, 1946   MRN:  [id number]  PCP:  Darreld Mclean, MD    Chief Complaint: No chief complaint on file.   History of Present Illness:  Natalie Fowler is a 77 y.o. very pleasant female patient who presents with the following:  Patient seen today to follow-up on bone density Most recent visit with myself was in [place] of diet-controlled diabetes, dyslipidemia  Bone density completed 12/21-low bone mass, T-score -1.9 Mammogram up-to-date Blood work can be updated-lipid done October  She is taking Evista Metformin 500 once daily Zocor 20 Lisinopril 5 Aspirin 81  Stress Myoview 2021, negative  Eye exam Update A1c today Needs urine microalbumin Recommend COVID booster  Lab Results  Component Value Date   HGBA1C 7.0 (H) 07/08/2022     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   Dyslipidemia 05/02/2021   Diet-controlled diabetes mellitus ([place]) 01/20/2021   Low back pain 11/19/2016   History of vitamin D deficiency 09/30/2016   H/O colonoscopy with polypectomy 09/30/2015   Vaginal atrophy 09/25/2014   Vertigo    Osteopenia    Acid reflux     Past Medical History:  Diagnosis Date   Acid reflux    Arthritis    [name] tumor    benign   Osteopenia    Ovarian cyst, bilateral    Pneumonia    PONV (postoperative nausea and vomiting)    Pre-diabetes    Vertigo    Vitamin D deficiency     Past Surgical History:  Procedure Laterality Date   APPENDECTOMY     CHOLECYSTECTOMY     CYST EXCISION     CHEST    JOINT REPLACEMENT     OOPHORECTOMY  10/23/2006   DIAG LAP WITH BSO   PELVIC LAPAROSCOPY  10/23/2006   DIAG LAP WITH BSO   ROTATOR CUFF REPAIR     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   Procedure: TOTAL  KNEE ARTHROPLASTY;  Surgeon: Paralee Cancel, MD;  Location: [facility];  Service: Orthopedics;  Laterality: Left;    Social History   Tobacco Use   Smoking status: Never   Smokeless tobacco: Never  Vaping Use   Vaping Use: Never used  Substance Use Topics   Alcohol use: No    Alcohol/week: 0.0 standard drinks of alcohol   Drug use: Never    Family History  Problem Relation Age of Onset   Diabetes Mother    Hypertension Mother    Cancer Father        COLON CA   Osteoporosis Sister    Cancer Sister 67       PANCREATIC   Cancer Maternal Aunt        OVARIAN CANCER   Heart disease Maternal Aunt    Hypertension Daughter    Cancer Paternal Uncle        Colon cancer   Breast cancer Neg Hx     Allergies  Allergen Reactions   Vioxx [Rofecoxib] Rash    Medication list has been reviewed and updated.  Current Outpatient Medications on File Prior to Visit  Medication Sig Dispense Refill   albuterol (VENTOLIN HFA) 108 (90 Base) MCG/ACT inhaler Inhale 2 puffs into the lungs every 6 (six) hours as needed  for wheezing or shortness of breath. 8 g 2   aspirin 81 MG chewable tablet Chew by mouth daily.     cholecalciferol (VITAMIN D) 1000 units tablet Take 1,000 Units by mouth daily.     lisinopril (ZESTRIL) 5 MG tablet Take 1 tablet (5 mg total) by mouth daily. 90 tablet 3   meclizine (ANTIVERT) 25 MG tablet Take 1 tablet (25 mg total) by mouth 3 (three) times daily as needed for dizziness. 30 tablet 0   metFORMIN (GLUCOPHAGE) 500 MG tablet Take 1 tablet by mouth once daily 90 tablet 1   raloxifene (EVISTA) 60 MG tablet Take 1 tablet (60 mg total) by mouth daily. 90 tablet 3   simvastatin (ZOCOR) 20 MG tablet Take 1 tablet (20 mg total) by mouth at bedtime. 90 tablet 3   No current facility-administered medications on file prior to visit.    Review of Systems:  As per HPI- otherwise negative.   Physical Examination: There were no vitals filed for this visit. There were no vitals  filed for this visit. There is no height or weight on file to calculate BMI. Ideal Body Weight:    GEN: no acute distress. HEENT: Atraumatic, Normocephalic.  Ears and Nose: No external deformity. CV: RRR, No M/G/R. No JVD. No thrill. No extra heart sounds. PULM: CTA B, no wheezes, crackles, rhonchi. No retractions. No resp. distress. No accessory muscle use. ABD: S, NT, ND, +BS. No rebound. No HSM. EXTR: No c/c/e PSYCH: Normally interactive. Conversant.    Assessment and Plan: ***  Signed Lamar Blinks, MD

## 2023-01-09 NOTE — Patient Instructions (Incomplete)
It was good to see you again today Recommend getting a COVID booster at your pharmacy if needed Eye exam may be needed- scheduled this week! I will be in touch with your labs as soon as possible  I ordered a bone density for you- please stop by the imaging dept on the ground floor today to set up your bone density   We will change lisinopril to losartan to hopefully stop your cough

## 2023-01-11 ENCOUNTER — Ambulatory Visit (INDEPENDENT_AMBULATORY_CARE_PROVIDER_SITE_OTHER): Payer: PPO | Admitting: Family Medicine

## 2023-01-11 ENCOUNTER — Encounter: Payer: Self-pay | Admitting: Family Medicine

## 2023-01-11 VITALS — BP 132/84 | HR 97 | Resp 18 | Ht 60.25 in | Wt 168.4 lb

## 2023-01-11 DIAGNOSIS — M8580 Other specified disorders of bone density and structure, unspecified site: Secondary | ICD-10-CM | POA: Diagnosis not present

## 2023-01-11 DIAGNOSIS — E785 Hyperlipidemia, unspecified: Secondary | ICD-10-CM | POA: Diagnosis not present

## 2023-01-11 DIAGNOSIS — Z5181 Encounter for therapeutic drug level monitoring: Secondary | ICD-10-CM

## 2023-01-11 DIAGNOSIS — M8589 Other specified disorders of bone density and structure, multiple sites: Secondary | ICD-10-CM

## 2023-01-11 DIAGNOSIS — Z1329 Encounter for screening for other suspected endocrine disorder: Secondary | ICD-10-CM | POA: Diagnosis not present

## 2023-01-11 DIAGNOSIS — E119 Type 2 diabetes mellitus without complications: Secondary | ICD-10-CM | POA: Diagnosis not present

## 2023-01-11 MED ORDER — LOSARTAN POTASSIUM 25 MG PO TABS: 25.0000 mg | ORAL_TABLET | Freq: Every day | ORAL | 3 refills | Status: DC

## 2023-01-12 ENCOUNTER — Encounter: Payer: Self-pay | Admitting: Family Medicine

## 2023-01-12 LAB — CBC
HCT: 43.2 % (ref 36.0–46.0)
Hemoglobin: 14.6 g/dL (ref 12.0–15.0)
MCHC: 33.8 g/dL (ref 30.0–36.0)
MCV: 89.2 fl (ref 78.0–100.0)
Platelets: 170 10*3/uL (ref 150.0–400.0)
RBC: 4.84 Mil/uL (ref 3.87–5.11)
RDW: 13.6 % (ref 11.5–15.5)
WBC: 8.8 10*3/uL (ref 4.0–10.5)

## 2023-01-12 LAB — COMPREHENSIVE METABOLIC PANEL
ALT: 36 U/L — ABNORMAL HIGH (ref 0–35)
AST: 25 U/L (ref 0–37)
Albumin: 4.2 g/dL (ref 3.5–5.2)
Alkaline Phosphatase: 51 U/L (ref 39–117)
BUN: 17 mg/dL (ref 6–23)
CO2: 27 mEq/L (ref 19–32)
Calcium: 9.5 mg/dL (ref 8.4–10.5)
Chloride: 105 mEq/L (ref 96–112)
Creatinine, Ser: 1.03 mg/dL (ref 0.40–1.20)
GFR: 53.45 mL/min — ABNORMAL LOW (ref >60.00)
Glucose, Bld: 134 mg/dL — ABNORMAL HIGH (ref 70–99)
Potassium: 4.4 mEq/L (ref 3.5–5.1)
Sodium: 141 mEq/L (ref 135–145)
Total Bilirubin: 0.3 mg/dL (ref 0.2–1.2)
Total Protein: 7 g/dL (ref 6.0–8.3)

## 2023-01-12 LAB — MICROALBUMIN / CREATININE URINE RATIO
Creatinine,U: 163.5 mg/dL
Microalb Creat Ratio: 3.6 mg/g (ref 0.0–30.0)
Microalb, Ur: 5.9 mg/dL — ABNORMAL HIGH (ref 0.0–1.9)

## 2023-01-12 LAB — HEMOGLOBIN A1C: Hgb A1c MFr Bld: 8.2 % — ABNORMAL HIGH (ref 4.6–6.5)

## 2023-01-12 LAB — TSH: TSH: 2.28 u[IU]/mL (ref 0.35–5.50)

## 2023-01-13 ENCOUNTER — Encounter: Payer: Self-pay | Admitting: Family Medicine

## 2023-01-13 MED ORDER — EMPAGLIFLOZIN 10 MG PO TABS: 10.0000 mg | ORAL_TABLET | Freq: Every day | ORAL | 3 refills | Status: DC

## 2023-01-14 ENCOUNTER — Ambulatory Visit (HOSPITAL_BASED_OUTPATIENT_CLINIC_OR_DEPARTMENT_OTHER)
Admission: RE | Admit: 2023-01-14 | Discharge: 2023-01-14 | Disposition: A | Discharge status: Home / Self Care | Payer: PPO | Source: Ambulatory Visit | Attending: Family Medicine | Admitting: Family Medicine

## 2023-01-14 ENCOUNTER — Encounter: Payer: Self-pay | Admitting: Family Medicine

## 2023-01-14 DIAGNOSIS — Z780 Asymptomatic menopausal state: Secondary | ICD-10-CM | POA: Insufficient documentation

## 2023-01-14 DIAGNOSIS — M8580 Other specified disorders of bone density and structure, unspecified site: Secondary | ICD-10-CM

## 2023-01-14 DIAGNOSIS — Z13820 Encounter for screening for osteoporosis: Secondary | ICD-10-CM | POA: Diagnosis not present

## 2023-01-14 DIAGNOSIS — E119 Type 2 diabetes mellitus without complications: Secondary | ICD-10-CM | POA: Diagnosis not present

## 2023-01-14 DIAGNOSIS — M85852 Other specified disorders of bone density and structure, left thigh: Secondary | ICD-10-CM | POA: Insufficient documentation

## 2023-01-15 ENCOUNTER — Encounter: Payer: Self-pay | Admitting: Family Medicine

## 2023-01-22 NOTE — Telephone Encounter (Signed)
Opened up in error.

## 2023-01-25 ENCOUNTER — Other Ambulatory Visit: Payer: Self-pay | Admitting: Family Medicine

## 2023-01-25 DIAGNOSIS — M8589 Other specified disorders of bone density and structure, multiple sites: Secondary | ICD-10-CM

## 2023-02-22 DIAGNOSIS — H2513 Age-related nuclear cataract, bilateral: Secondary | ICD-10-CM | POA: Diagnosis not present

## 2023-02-22 DIAGNOSIS — H43813 Vitreous degeneration, bilateral: Secondary | ICD-10-CM | POA: Diagnosis not present

## 2023-02-22 DIAGNOSIS — H04123 Dry eye syndrome of bilateral lacrimal glands: Secondary | ICD-10-CM | POA: Diagnosis not present

## 2023-02-22 DIAGNOSIS — Z7984 Long term (current) use of oral hypoglycemic drugs: Secondary | ICD-10-CM | POA: Diagnosis not present

## 2023-02-22 DIAGNOSIS — H524 Presbyopia: Secondary | ICD-10-CM | POA: Diagnosis not present

## 2023-02-22 DIAGNOSIS — H5203 Hypermetropia, bilateral: Secondary | ICD-10-CM | POA: Diagnosis not present

## 2023-02-22 DIAGNOSIS — H43393 Other vitreous opacities, bilateral: Secondary | ICD-10-CM | POA: Diagnosis not present

## 2023-02-22 DIAGNOSIS — E119 Type 2 diabetes mellitus without complications: Secondary | ICD-10-CM | POA: Diagnosis not present

## 2023-02-22 DIAGNOSIS — H25013 Cortical age-related cataract, bilateral: Secondary | ICD-10-CM | POA: Diagnosis not present

## 2023-02-22 LAB — HM DIABETES EYE EXAM

## 2023-03-11 ENCOUNTER — Ambulatory Visit (INDEPENDENT_AMBULATORY_CARE_PROVIDER_SITE_OTHER): Payer: PPO | Admitting: Family Medicine

## 2023-03-11 VITALS — BP 122/80 | HR 81 | Temp 98.2°F | Resp 18 | Ht 60.0 in | Wt 168.0 lb

## 2023-03-11 DIAGNOSIS — R051 Acute cough: Secondary | ICD-10-CM | POA: Diagnosis not present

## 2023-03-11 DIAGNOSIS — E119 Type 2 diabetes mellitus without complications: Secondary | ICD-10-CM

## 2023-03-11 LAB — POC COVID19 BINAXNOW: SARS Coronavirus 2 Ag: NEGATIVE

## 2023-03-11 MED ORDER — GLIPIZIDE ER 2.5 MG PO TB24: 2.5000 mg | ORAL_TABLET | Freq: Every day | ORAL | 1 refills | Status: DC

## 2023-03-11 MED ORDER — ALBUTEROL SULFATE HFA 108 (90 BASE) MCG/ACT IN AERS: 2.0000 | INHALATION_SPRAY | Freq: Four times a day (QID) | RESPIRATORY_TRACT | 0 refills | Status: DC | PRN

## 2023-03-11 NOTE — Progress Notes (Signed)
[facility] at [facility] [address], [address] [place], [place] [address] [phone]  Date:  03/11/2023   Name:  Natalie Fowler   DOB:  10/13/1946   MRN:  [id number]  PCP:  Pearline Cables, MD    Chief Complaint: Cough (Cough x 2 days. HA started today. Has taken Tylenol .)   History of Present Illness:  Natalie Fowler is a 77 y.o. very pleasant female patient who presents with the following:  Pt seen today with concern of cough Last visit with myself was in February to follow-up on bone density.  History of osteopenia, not insulin-dependent diabetes, dyslipidemia, gastric ulcer being managed by GI  COVID booster up-to-date Eye exam- she did this 3 weeks ago per Dr Severiano Gilbert with [facility]- 02/22/23  Her last A1c was elevated, we added Jardiance to metformin However she stopped taking it this week; she feels like it made her dizzy so she does not want to take it  Metformin dose is somewhat limited due to mild renal insuf   She notes a cough as of yesterday morning, better today HA today No sneezing No itchy eyes No fever or chills She did check her temperature and it was ok No GI  She is getting endoscopy next week- she was worried she might be sick for her endoscopy  Lab Results  Component Value Date   HGBA1C 8.2 (H) 01/11/2023   Aspirin 81 Jardiance 10 Losartan 25 Metformin 500 evista Simvastatin 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   Dyslipidemia 05/02/2021   Diet-controlled diabetes mellitus 01/20/2021   Low back pain 11/19/2016   History of vitamin D deficiency 09/30/2016   H/O colonoscopy with polypectomy 09/30/2015   Vaginal atrophy 09/25/2014   Vertigo    Osteopenia    Acid reflux     Past Medical History:  Diagnosis Date   Acid reflux    Arthritis    Darnelle Bos tumor    benign   Osteopenia    Ovarian cyst, bilateral    Pneumonia    PONV  (postoperative nausea and vomiting)    Pre-diabetes    Vertigo    Vitamin D deficiency     Past Surgical History:  Procedure Laterality Date   APPENDECTOMY     CHOLECYSTECTOMY     CYST EXCISION     CHEST    JOINT REPLACEMENT     OOPHORECTOMY  10/23/2006   DIAG LAP WITH BSO   PELVIC LAPAROSCOPY  10/23/2006   DIAG LAP WITH BSO   ROTATOR CUFF REPAIR     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   Procedure: TOTAL KNEE ARTHROPLASTY;  Surgeon: Durene Romans, MD;  Location: [facility];  Service: Orthopedics;  Laterality: Left;    Social History   Tobacco Use   Smoking status: Never   Smokeless tobacco: Never  Vaping Use   Vaping Use: Never used  Substance Use Topics   Alcohol use: No    Alcohol/week: 0.0 standard drinks of alcohol   Drug use: Never    Family History  Problem Relation Age of Onset   Diabetes Mother    Hypertension Mother    Cancer Father        COLON CA   Osteoporosis Sister    Cancer Sister 43       PANCREATIC   Cancer Maternal Aunt        OVARIAN CANCER  Heart disease Maternal Aunt    Hypertension Daughter    Cancer Paternal Uncle        Colon cancer   Breast cancer Neg Hx     Allergies  Allergen Reactions   Vioxx [Rofecoxib] Rash    Medication list has been reviewed and updated.  Current Outpatient Medications on File Prior to Visit  Medication Sig Dispense Refill   albuterol (VENTOLIN HFA) 108 (90 Base) MCG/ACT inhaler Inhale 2 puffs into the lungs every 6 (six) hours as needed for wheezing or shortness of breath. 8 g 2   aspirin 81 MG chewable tablet Chew by mouth daily.     cholecalciferol (VITAMIN D) 1000 units tablet Take 1,000 Units by mouth daily.     losartan (COZAAR) 25 MG tablet Take 1 tablet (25 mg total) by mouth daily. 90 tablet 3   meclizine (ANTIVERT) 25 MG tablet Take 1 tablet (25 mg total) by mouth 3 (three) times daily as needed for dizziness. 30 tablet 0   metFORMIN (GLUCOPHAGE) 500 MG tablet Take 1 tablet by mouth once  daily 90 tablet 1   raloxifene (EVISTA) 60 MG tablet Take 1 tablet by mouth once daily 90 tablet 1   simvastatin (ZOCOR) 20 MG tablet Take 1 tablet (20 mg total) by mouth at bedtime. 90 tablet 3   No current facility-administered medications on file prior to visit.    Review of Systems:  As per HPI- otherwise negative.   Physical Examination: Vitals:   03/11/23 0818  BP: 122/80  Pulse: 81  Resp: 18  Temp: 98.2 F (36.8 C)  SpO2: 98%   Vitals:   03/11/23 0818  Weight: 168 lb (76.2 kg)  Height: 5' (1.524 m)   Body mass index is 32.81 kg/m. Ideal Body Weight: Weight in (lb) to have BMI = 25: 127.7  GEN: no acute distress.  Obese, looks well  HEENT: Atraumatic, Normocephalic.  Bilateral TM wnl, oropharynx normal.  PEERL,EOMI.   Ears and Nose: No external deformity. CV: RRR, No M/G/R. No JVD. No thrill. No extra heart sounds. PULM:mild expiratory wheezes bilaterally, no crackles, rhonchi. No retractions. No resp. distress. No accessory muscle use. ABD: S, NT, ND, +BS. No rebound. No HSM. EXTR: No c/c/e PSYCH: Normally interactive. Conversant.   Results for orders placed or performed in visit on 03/11/23  POC COVID-19 BinaxNow  Result Value Ref Range   SARS Coronavirus 2 Ag Negative Negative    Assessment and Plan: Acute cough - Plan: POC COVID-19 BinaxNow, albuterol (VENTOLIN HFA) 108 (90 Base) MCG/ACT inhaler  Controlled type 2 diabetes mellitus without complication, without long-term current use of insulin - Plan: glipiZIDE (GLUCOTROL XL) 2.5 MG 24 hr tablet  Seen today with concern of cough for one day and headache today, better with tylenol  Covid negative Reassured, likely a viral URI or allergies Rx albuterol for wheezing Asked her to please alert me if not getting better in a few days- Sooner if worse.   She stopped SGLT 2 due to SE- will add low dose glipizde ER instead   Signed Abbe Amsterdam, MD

## 2023-03-11 NOTE — Patient Instructions (Addendum)
It was great to see you again today  Let's have you try taking Glipizide 2.5 mg daily (take with first meal of the day) for your diabetes- add to metformin Let's recheck your A1c in about 3 months  For cough- you likely have a cold (viral infection) or allergies Covid test is negative  I sent an inhaler rx to your [facility]- please use this as needed for cough and wheezing You might try adding an OTC allergy med such as zyrtec or claritin as well  Please let me know if you are not feeling better in the next few days-.Sooner if worse.

## 2023-03-17 DIAGNOSIS — K259 Gastric ulcer, unspecified as acute or chronic, without hemorrhage or perforation: Secondary | ICD-10-CM | POA: Diagnosis not present

## 2023-03-17 DIAGNOSIS — K3189 Other diseases of stomach and duodenum: Secondary | ICD-10-CM | POA: Diagnosis not present

## 2023-03-17 DIAGNOSIS — K2950 Unspecified chronic gastritis without bleeding: Secondary | ICD-10-CM | POA: Diagnosis not present

## 2023-03-17 DIAGNOSIS — K449 Diaphragmatic hernia without obstruction or gangrene: Secondary | ICD-10-CM | POA: Diagnosis not present

## 2023-03-17 DIAGNOSIS — Z8711 Personal history of peptic ulcer disease: Secondary | ICD-10-CM | POA: Diagnosis not present

## 2023-03-17 DIAGNOSIS — K208 Other esophagitis without bleeding: Secondary | ICD-10-CM | POA: Diagnosis not present

## 2023-03-25 DIAGNOSIS — N3010 Interstitial cystitis (chronic) without hematuria: Secondary | ICD-10-CM | POA: Diagnosis not present

## 2023-05-22 NOTE — Patient Instructions (Incomplete)
Good to see you today- I will be in touch with your labs asap  You can by imaging on the way out and schedule your mammogram and coronary calcium  Take care!  Congrats on your marathon!!   

## 2023-05-22 NOTE — Progress Notes (Unsigned)
[facility] [address] [place], [place] [address] [phone]  Date:  05/24/2023   Name:  Natalie Fowler   DOB:  13-May-1946   MRN:  [id number]  PCP:  Pearline Cables, MD    Chief Complaint: No chief complaint on file.   History of Present Illness:  Natalie Fowler is a 77 y.o. 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   Dyslipidemia 05/02/2021   Diet-controlled diabetes mellitus (HCC) 01/20/2021   Low back pain 11/19/2016   History of vitamin D deficiency 09/30/2016   H/O colonoscopy with polypectomy 09/30/2015   Vaginal atrophy 09/25/2014   Vertigo    Osteopenia    Acid reflux     Past Medical History:  Diagnosis Date   Acid reflux    Arthritis    Darnelle Bos tumor    benign   Osteopenia    Ovarian cyst, bilateral    Pneumonia    PONV (postoperative nausea and vomiting)    Pre-diabetes    Vertigo    Vitamin D deficiency     Past Surgical History:  Procedure Laterality Date   APPENDECTOMY     CHOLECYSTECTOMY     CYST EXCISION     CHEST    JOINT REPLACEMENT     OOPHORECTOMY  10/23/2006   DIAG LAP WITH BSO   PELVIC LAPAROSCOPY  10/23/2006   DIAG LAP WITH BSO   ROTATOR CUFF REPAIR     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   Procedure: TOTAL KNEE ARTHROPLASTY;  Surgeon: Durene Romans, MD;  Location: [facility];  Service: Orthopedics;  Laterality: Left;    Social History   Tobacco Use   Smoking status: Never   Smokeless tobacco:  Never  Vaping Use   Vaping Use: Never used  Substance Use Topics   Alcohol use: No    Alcohol/week: 0.0 standard drinks of alcohol   Drug use: Never    Family History  Problem Relation Age of Onset   Diabetes Mother    Hypertension Mother    Cancer Father        COLON CA   Osteoporosis Sister    Cancer Sister 10       PANCREATIC   Cancer Maternal Aunt        OVARIAN CANCER   Heart disease Maternal Aunt    Hypertension Daughter    Cancer Paternal Uncle        Colon cancer   Breast cancer Neg Hx     Allergies  Allergen Reactions   Vioxx [Rofecoxib] Rash    Medication list has been reviewed and updated.  Current Outpatient Medications on File Prior to Visit  Medication Sig Dispense Refill   albuterol (VENTOLIN HFA) 108 (90 Base) MCG/ACT inhaler Inhale 2 puffs into the lungs every 6 (six) hours as needed for wheezing or shortness of breath. 8 g 2   albuterol (VENTOLIN HFA) 108 (90 Base) MCG/ACT inhaler Inhale 2 puffs into  the lungs every 6 (six) hours as needed for wheezing or shortness of breath. 1 each 0   aspirin 81 MG chewable tablet Chew by mouth daily.     cholecalciferol (VITAMIN D) 1000 units tablet Take 1,000 Units by mouth daily.     glipiZIDE (GLUCOTROL XL) 2.5 MG 24 hr tablet Take 1 tablet (2.5 mg total) by mouth daily with breakfast. 90 tablet 1   losartan (COZAAR) 25 MG tablet Take 1 tablet (25 mg total) by mouth daily. 90 tablet 3   meclizine (ANTIVERT) 25 MG tablet Take 1 tablet (25 mg total) by mouth 3 (three) times daily as needed for dizziness. 30 tablet 0   metFORMIN (GLUCOPHAGE) 500 MG tablet Take 1 tablet by mouth once daily 90 tablet 1   raloxifene (EVISTA) 60 MG tablet Take 1 tablet by mouth once daily 90 tablet 1   simvastatin (ZOCOR) 20 MG tablet Take 1 tablet (20 mg total) by mouth at bedtime. 90 tablet 3   No current facility-administered medications on file prior to visit.    Review of Systems:  ***  Physical Examination: There were no  vitals filed for this visit. There were no vitals filed for this visit. There is no height or weight on file to calculate BMI. Ideal Body Weight:    ***  Assessment and Plan: ***  Signed Abbe Amsterdam, MD

## 2023-05-24 ENCOUNTER — Ambulatory Visit (INDEPENDENT_AMBULATORY_CARE_PROVIDER_SITE_OTHER): Payer: PPO | Admitting: Family Medicine

## 2023-05-24 ENCOUNTER — Encounter: Payer: Self-pay | Admitting: Family Medicine

## 2023-05-24 VITALS — BP 112/62 | HR 96 | Temp 97.8°F | Resp 18 | Ht 60.0 in | Wt 165.6 lb

## 2023-05-24 DIAGNOSIS — Z7984 Long term (current) use of oral hypoglycemic drugs: Secondary | ICD-10-CM | POA: Diagnosis not present

## 2023-05-24 DIAGNOSIS — E119 Type 2 diabetes mellitus without complications: Secondary | ICD-10-CM | POA: Diagnosis not present

## 2023-05-24 DIAGNOSIS — E785 Hyperlipidemia, unspecified: Secondary | ICD-10-CM

## 2023-05-24 LAB — BASIC METABOLIC PANEL
BUN: 13 mg/dL (ref 6–23)
CO2: 28 mEq/L (ref 19–32)
Calcium: 9.9 mg/dL (ref 8.4–10.5)
Chloride: 103 mEq/L (ref 96–112)
Creatinine, Ser: 1.01 mg/dL (ref 0.40–1.20)
GFR: 54.58 mL/min — ABNORMAL LOW (ref >60.00)
Glucose, Bld: 164 mg/dL — ABNORMAL HIGH (ref 70–99)
Potassium: 5 mEq/L (ref 3.5–5.1)
Sodium: 141 mEq/L (ref 135–145)

## 2023-05-24 LAB — HEMOGLOBIN A1C: Hgb A1c MFr Bld: 7.6 % — ABNORMAL HIGH (ref 4.6–6.5)

## 2023-05-24 MED ORDER — BLOOD GLUCOSE MONITORING SUPPL DEVI: 1.0000 | Freq: Every day | 0 refills | Status: DC | PRN

## 2023-05-24 MED ORDER — LANCETS MISC. MISC: 1.0000 | Freq: Three times a day (TID) | 0 refills | Status: AC

## 2023-05-24 MED ORDER — LANCET DEVICE MISC: 1.0000 | Freq: Three times a day (TID) | 1 refills | Status: AC

## 2023-05-24 MED ORDER — BLOOD GLUCOSE TEST VI STRP: ORAL_STRIP | 2 refills | Status: DC

## 2023-05-29 IMAGING — CR DG CHEST 2V
2 series · 2 of 2 positions shown · non-contrast
Comparison: 09/26/2020

CLINICAL DATA: Cough

EXAM:
CHEST - 2 VIEW

[w chest pa]
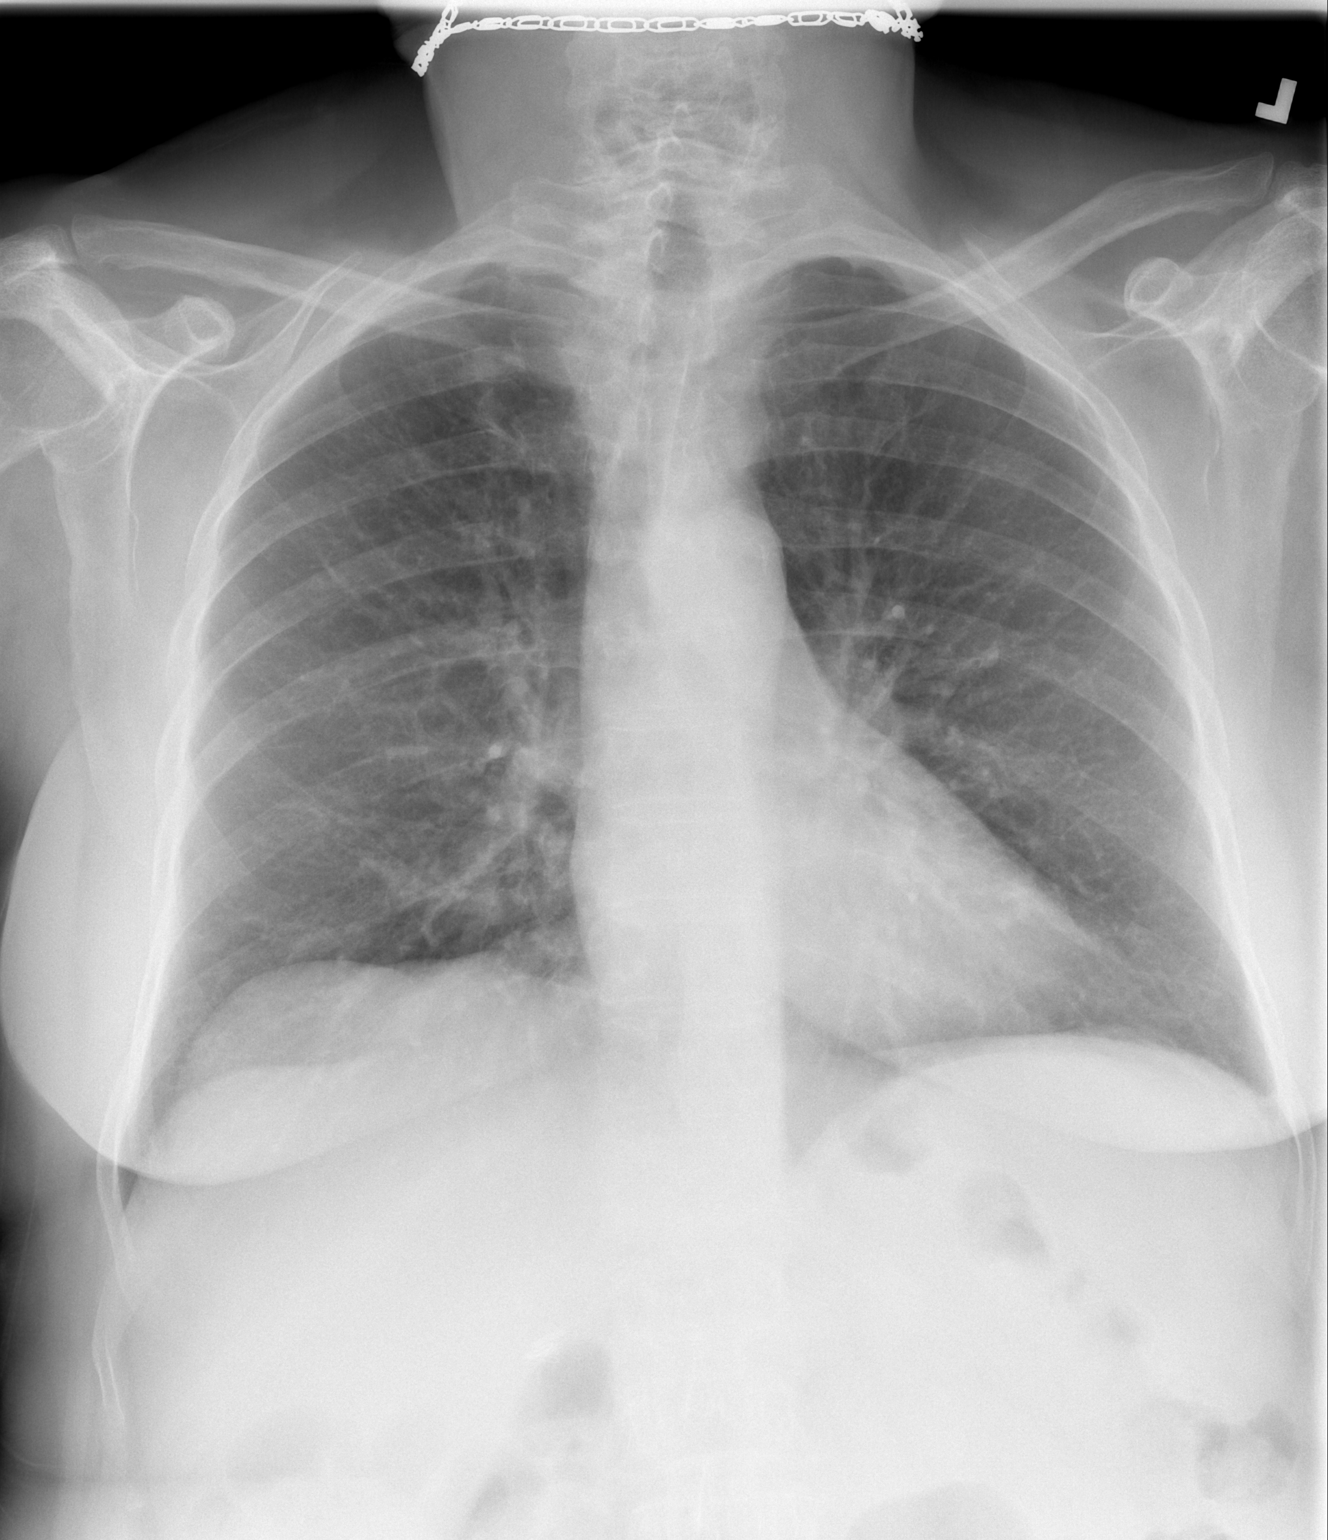

[w chest lat]
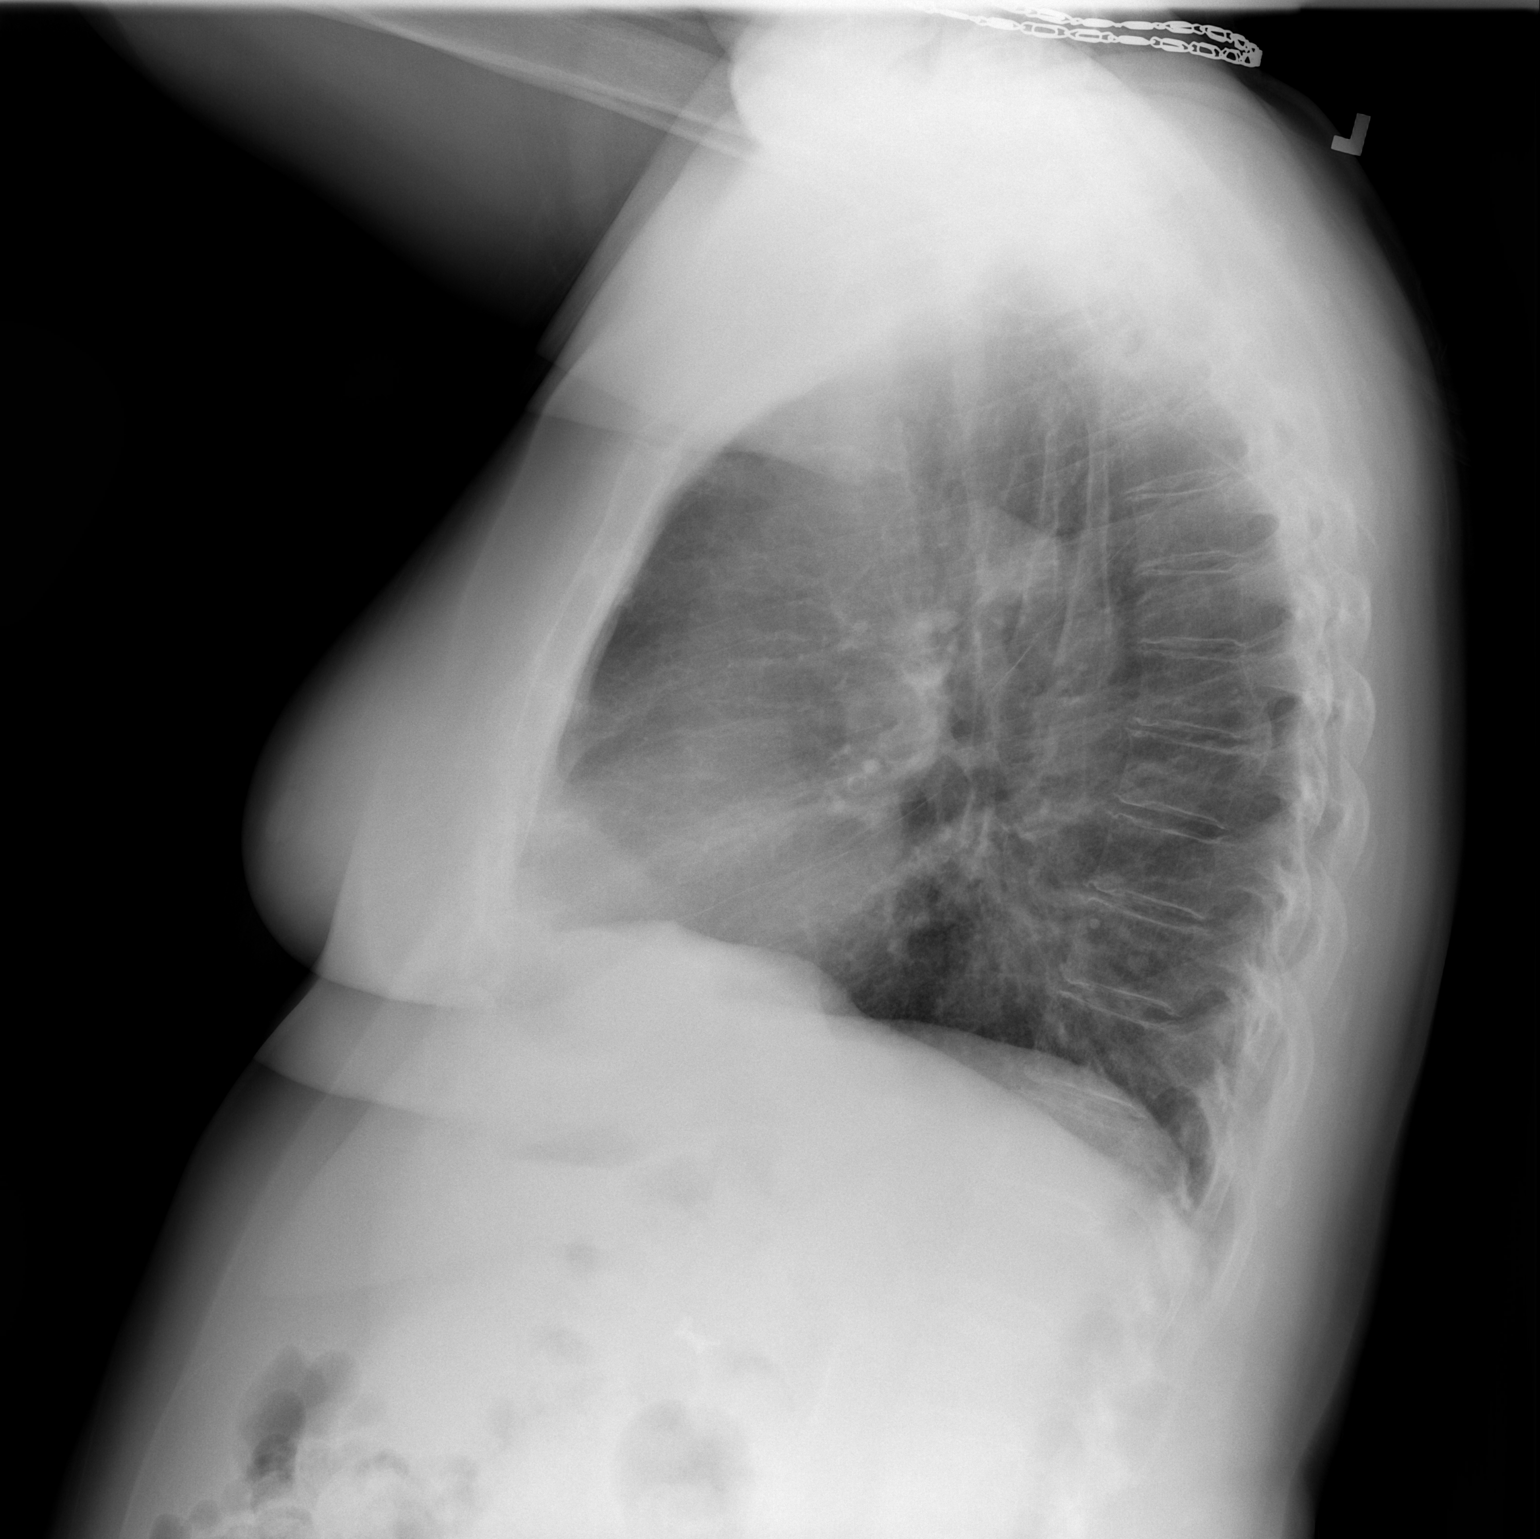

[2 of 2 positions shown; findings below may reference images not displayed]

FINDINGS: The heart size and mediastinal contours are within normal limits.
Both lungs are clear. The visualized skeletal structures are
unremarkable.
IMPRESSION: No active cardiopulmonary disease.

## 2023-06-02 DIAGNOSIS — K2100 Gastro-esophageal reflux disease with esophagitis, without bleeding: Secondary | ICD-10-CM | POA: Diagnosis not present

## 2023-06-02 DIAGNOSIS — R1013 Epigastric pain: Secondary | ICD-10-CM | POA: Diagnosis not present

## 2023-06-02 DIAGNOSIS — G8929 Other chronic pain: Secondary | ICD-10-CM | POA: Diagnosis not present

## 2023-06-02 DIAGNOSIS — Z86010 Personal history of colonic polyps: Secondary | ICD-10-CM | POA: Diagnosis not present

## 2023-06-07 ENCOUNTER — Other Ambulatory Visit: Payer: Self-pay | Admitting: Family Medicine

## 2023-06-07 DIAGNOSIS — E119 Type 2 diabetes mellitus without complications: Secondary | ICD-10-CM

## 2023-06-29 ENCOUNTER — Emergency Department (HOSPITAL_BASED_OUTPATIENT_CLINIC_OR_DEPARTMENT_OTHER)
Admission: EM | Admit: 2023-06-29 | Discharge: 2023-06-29 | Disposition: A | Discharge status: Home / Self Care | Payer: PPO | Attending: Emergency Medicine | Admitting: Emergency Medicine

## 2023-06-29 ENCOUNTER — Other Ambulatory Visit: Payer: Self-pay

## 2023-06-29 ENCOUNTER — Emergency Department (HOSPITAL_BASED_OUTPATIENT_CLINIC_OR_DEPARTMENT_OTHER): Payer: PPO

## 2023-06-29 ENCOUNTER — Telehealth: Payer: Self-pay | Admitting: Family Medicine

## 2023-06-29 ENCOUNTER — Encounter (HOSPITAL_BASED_OUTPATIENT_CLINIC_OR_DEPARTMENT_OTHER): Payer: Self-pay | Admitting: Emergency Medicine

## 2023-06-29 DIAGNOSIS — R519 Headache, unspecified: Secondary | ICD-10-CM | POA: Diagnosis not present

## 2023-06-29 DIAGNOSIS — Z7984 Long term (current) use of oral hypoglycemic drugs: Secondary | ICD-10-CM | POA: Insufficient documentation

## 2023-06-29 DIAGNOSIS — Z79899 Other long term (current) drug therapy: Secondary | ICD-10-CM | POA: Diagnosis not present

## 2023-06-29 DIAGNOSIS — I10 Essential (primary) hypertension: Secondary | ICD-10-CM | POA: Diagnosis not present

## 2023-06-29 DIAGNOSIS — H538 Other visual disturbances: Secondary | ICD-10-CM | POA: Diagnosis not present

## 2023-06-29 DIAGNOSIS — Z7982 Long term (current) use of aspirin: Secondary | ICD-10-CM | POA: Diagnosis not present

## 2023-06-29 DIAGNOSIS — E1165 Type 2 diabetes mellitus with hyperglycemia: Secondary | ICD-10-CM | POA: Diagnosis not present

## 2023-06-29 LAB — CBC WITH DIFFERENTIAL/PLATELET
Abs Immature Granulocytes: 0.02 10*3/uL (ref 0.00–0.07)
Basophils Absolute: 0 10*3/uL (ref 0.0–0.1)
Basophils Relative: 0 %
Eosinophils Absolute: 0.2 10*3/uL (ref 0.0–0.5)
Eosinophils Relative: 2 %
HCT: 41.6 % (ref 36.0–46.0)
Hemoglobin: 14.3 g/dL (ref 12.0–15.0)
Immature Granulocytes: 0 %
Lymphocytes Relative: 37 %
Lymphs Abs: 2.7 10*3/uL (ref 0.7–4.0)
MCH: 30 pg (ref 26.0–34.0)
MCHC: 34.4 g/dL (ref 30.0–36.0)
MCV: 87.4 fL (ref 80.0–100.0)
Monocytes Absolute: 0.7 10*3/uL (ref 0.1–1.0)
Monocytes Relative: 9 %
Neutro Abs: 3.8 10*3/uL (ref 1.7–7.7)
Neutrophils Relative %: 52 %
Platelets: 176 10*3/uL (ref 150–400)
RBC: 4.76 MIL/uL (ref 3.87–5.11)
RDW: 12.9 % (ref 11.5–15.5)
WBC: 7.4 10*3/uL (ref 4.0–10.5)
nRBC: 0 % (ref 0.0–0.2)

## 2023-06-29 LAB — COMPREHENSIVE METABOLIC PANEL
ALT: 23 U/L (ref 0–44)
AST: 20 U/L (ref 15–41)
Albumin: 3.7 g/dL (ref 3.5–5.0)
Alkaline Phosphatase: 46 U/L (ref 38–126)
Anion gap: 9 (ref 5–15)
BUN: 17 mg/dL (ref 8–23)
CO2: 24 mmol/L (ref 22–32)
Calcium: 8.9 mg/dL (ref 8.9–10.3)
Chloride: 104 mmol/L (ref 98–111)
Creatinine, Ser: 0.9 mg/dL (ref 0.44–1.00)
GFR, Estimated: >60 mL/min (ref >60)
Glucose, Bld: 152 mg/dL — ABNORMAL HIGH (ref 70–99)
Potassium: 3.8 mmol/L (ref 3.5–5.1)
Sodium: 137 mmol/L (ref 135–145)
Total Bilirubin: 0.4 mg/dL (ref 0.3–1.2)
Total Protein: 6.5 g/dL (ref 6.5–8.1)

## 2023-06-29 MED ORDER — TETRACAINE HCL 0.5 % OP SOLN
1.0000 [drp] | Freq: Once | OPHTHALMIC | Status: AC
  Administered 2023-06-29: 1 [drp] via OPHTHALMIC
  Filled 2023-06-29: qty 4

## 2023-06-29 MED ORDER — FLUORESCEIN SODIUM 1 MG OP STRP
1.0000 | ORAL_STRIP | Freq: Once | OPHTHALMIC | Status: AC
  Administered 2023-06-29: 1 via OPHTHALMIC
  Filled 2023-06-29: qty 1

## 2023-06-29 NOTE — Discharge Instructions (Signed)
Your head CT today is reassuring.  There is no acute abnormality.  Possibly your vision exam, today was reassuring.  Please follow-up with your primary care doctor, and your optometrist for further evaluation.  This may be secondary to dry eye.  I recommend you take lubricating drops, to help with the blurry vision and see if that improves.

## 2023-06-29 NOTE — Telephone Encounter (Signed)
Pt states she thinks glipizide is causing her to have blurred vision and she does not want to continue taking it. She says that every morning she wakes up with blurred vision and it eventually goes away. Melton Alar made aware and advised triage.

## 2023-06-29 NOTE — ED Provider Notes (Signed)
[facility] Provider Note   CSN: [id number] Arrival date & time: 06/29/23  1241     History  Chief Complaint  Patient presents with   Blurred Vision    Natalie Fowler is a 77 y.o. female, history of diabetes, hypertension, presents to the ED secondary to blurry vision of bilateral eyes, has been going on for the last month and a half.  She states that she intermittently has blurring of her vision, and feels like there is a fog over her both eyes, that comes and goes.  Typically happens for about once a week, and last 10 minutes, and resolves on their own.  She states it happens during the morning and in the afternoon.  Not associated with dry eye.  Does not really do anything to make it better, such as use eyedrops, she notes that it just goes away from its own.  Denies any numbness, tingling, changes in speech, confusion, or dizziness.  Denies any recent head trauma.  States she went and saw the eye doctor about 4 months ago, and she had a negative exam.  She recently was started on glipizide, and does not want to take it anymore as she thinks it is causing the blurring of the vision.  Denies feeling lightheaded, or weak during these episodes.  Sent by urgent care, for a CAT scan of her head for further evaluation.     Home Medications Prior to Admission medications   Medication Sig Start Date End Date Taking? Authorizing Provider  albuterol (VENTOLIN HFA) 108 (90 Base) MCG/ACT inhaler Inhale 2 puffs into the lungs every 6 (six) hours as needed for wheezing or shortness of breath. 07/30/22   Copland, Gwenlyn Found, MD  albuterol (VENTOLIN HFA) 108 (90 Base) MCG/ACT inhaler Inhale 2 puffs into the lungs every 6 (six) hours as needed for wheezing or shortness of breath. 03/11/23   Copland, Gwenlyn Found, MD  aspirin 81 MG chewable tablet Chew by mouth daily.    [provider]  Blood Glucose Monitoring Suppl DEVI 1 each by Does not apply route daily  as needed. May substitute to any manufacturer covered by patient's insurance. 05/24/23   Copland, Gwenlyn Found, MD  cholecalciferol (VITAMIN D) 1000 units tablet Take 1,000 Units by mouth daily.    [provider]  famotidine (PEPCID) 40 MG tablet Take by mouth. 03/26/23 03/25/24  [provider]  glipiZIDE (GLUCOTROL XL) 2.5 MG 24 hr tablet Take 1 tablet (2.5 mg total) by mouth daily with breakfast. 03/11/23   Copland, Gwenlyn Found, MD  Glucose Blood (BLOOD GLUCOSE TEST STRIPS) STRP Use as needed to monitor blood sugar once a day. May substitute to any manufacturer covered by patient's insurance. 05/24/23   Copland, Gwenlyn Found, MD  losartan (COZAAR) 25 MG tablet Take 1 tablet (25 mg total) by mouth daily. 01/11/23   Copland, Gwenlyn Found, MD  meclizine (ANTIVERT) 25 MG tablet Take 1 tablet (25 mg total) by mouth 3 (three) times daily as needed for dizziness. 07/08/22   Copland, Gwenlyn Found, MD  metFORMIN (GLUCOPHAGE) 500 MG tablet Take 1 tablet by mouth once daily 06/08/23   Copland, Gwenlyn Found, MD  raloxifene (EVISTA) 60 MG tablet Take 1 tablet by mouth once daily 01/25/23   Copland, Gwenlyn Found, MD  simvastatin (ZOCOR) 20 MG tablet Take 1 tablet (20 mg total) by mouth at bedtime. 07/13/22   Copland, Gwenlyn Found, MD      Allergies  Pantoprazole and Vioxx [rofecoxib]    Review of Systems   Review of Systems  Eyes:  Positive for visual disturbance. Negative for discharge.    Physical Exam Updated Vital Signs BP 115/63   Pulse 95   Temp 97.7 F (36.5 C)   Resp 15   Ht 5' (1.524 m)   Wt 73.5 kg   SpO2 97%   BMI 31.64 kg/m  Physical Exam Vitals and nursing note reviewed.  Constitutional:      General: She is not in acute distress.    Appearance: She is well-developed.  HENT:     Head: Normocephalic and atraumatic.  Eyes:     General: Lids are normal. Lids are everted, no foreign bodies appreciated. Vision grossly intact. No visual field deficit.    Intraocular pressure: Right eye pressure  is 19 mmHg. Left eye pressure is 18 mmHg.     Extraocular Movements: Extraocular movements intact.     Conjunctiva/sclera: Conjunctivae normal.     Pupils: Pupils are equal, round, and reactive to light.     Comments: Negative fluorescein exam. 20/20 vision of bilateral eyes  Cardiovascular:     Rate and Rhythm: Normal rate and regular rhythm.     Heart sounds: No murmur heard. Pulmonary:     Effort: Pulmonary effort is normal. No respiratory distress.     Breath sounds: Normal breath sounds.  Abdominal:     Palpations: Abdomen is soft.     Tenderness: There is no abdominal tenderness.  Musculoskeletal:        General: No swelling.     Cervical back: Neck supple.  Skin:    General: Skin is warm and dry.     Capillary Refill: Capillary refill takes less than 2 seconds.  Neurological:     Mental Status: She is alert.  Psychiatric:        Mood and Affect: Mood normal.     ED Results / Procedures / Treatments   Labs (all labs ordered are listed, but only abnormal results are displayed) Labs Reviewed  COMPREHENSIVE METABOLIC PANEL - Abnormal; Notable for the following components:      Result Value   Glucose, Bld 152 (*)    All other components within normal limits  CBC WITH DIFFERENTIAL/PLATELET    EKG None  Radiology CT Head Wo Contrast  Result Date: 06/29/2023 CLINICAL DATA:  blurry vision bilateral eyes EXAM: CT HEAD WITHOUT CONTRAST TECHNIQUE: Contiguous axial images were obtained from the base of the skull through the vertex without intravenous contrast. RADIATION DOSE REDUCTION: This exam was performed according to the departmental dose-optimization program which includes automated exposure control, adjustment of the mA and/or kV according to patient size and/or use of iterative reconstruction technique. COMPARISON:  None Available. FINDINGS: Brain: No evidence of large-territorial acute infarction. No parenchymal hemorrhage. No mass lesion. No extra-axial collection. No  mass effect or midline shift. No hydrocephalus. Basilar cisterns are patent. Vascular: No hyperdense vessel. Skull: No acute fracture or focal lesion. Sinuses/Orbits: Paranasal sinuses and mastoid air cells are clear. The orbits are unremarkable. Other: None. IMPRESSION: No acute intracranial abnormality. Electronically Signed   By: Tish Frederickson M.D.   On: 06/29/2023 14:50    Procedures Procedures    Medications Ordered in ED Medications  fluorescein ophthalmic strip 1 strip (1 strip Both Eyes Given by Other 06/29/23 1327)  tetracaine (PONTOCAINE) 0.5 % ophthalmic solution 1 drop (1 drop Both Eyes Given by Other 06/29/23 1327)    ED Course/ Medical  Decision Making/ A&P Clinical Course as of 06/29/23 1527  Tue Jun 29, 2023  4640 77 year old female here with intermittent blurry vision over the last month.  Blood sugars mildly elevated here and nothing specific on the eye exam.  Will recommend returning back to her eye doctor for further evaluation. [MB]    Clinical Course User Index [MB] Terrilee Files, MD                                 Medical Decision Making Patient is a 77 year old female, here for blurry vision, that is intermittent, and random.  Is occurs once a week, and then goes away on its own.  She has no neurodeficits on exam, negative fluorescein exam, as well as Tono-Pen, with the assistance of Dr. Charm Barges, greatly appreciate his assistance.  Although exam was within normal limits.  We obtained a head CT, for further evaluation as well as follow-up blood work.  She has no changes in extraocular movements, and there is no erythema, or injected conjunctiva.  Amount and/or Complexity of Data Reviewed Labs: ordered.    Details: Mild hyperglycemia 150 Radiology: ordered.    Details: CT head, shows no acute findings Discussion of management or test interpretation with external provider(s): Here for intermittent blurry vision. Fluorescein exam and Tono-Pen within normal limits.   CT head unremarkable.  No acute abnormalities of lab, except for hyperglycemia, with known diabetes.  Encouraged her to follow-up with her primary care doctor, and optometry for further evaluation.  She is overall well-appearing on my time, and she has no blurry vision. This may be 2/2 to dry eyes. We discussed lubricating drops and follow-up with PCP and optometry. Return precautions emphasized.   Risk Prescription drug management.    Final Clinical Impression(s) / ED Diagnoses Final diagnoses:  Blurry vision, bilateral    Rx / DC Orders ED Discharge Orders     None         Dolphus Jenny, Harley Alto, PA 06/29/23 1527    Terrilee Files, MD 06/29/23 1730

## 2023-06-29 NOTE — Telephone Encounter (Signed)
Pt is at the ED. °

## 2023-06-29 NOTE — ED Triage Notes (Signed)
Pt POV, ambulatory with steady gait- c/o BL blurred vision ("fog") x1 month, intermittent.   Recently starting taking glipizide.  VAN neg, AOx4.

## 2023-06-29 NOTE — ED Notes (Signed)
ED Provider at bedside. 

## 2023-06-30 NOTE — Telephone Encounter (Signed)
Pt was seen in the ED yesterday 06/29/23  Patient Name Natalie Fowler Gender: Female DOB: 1946-01-06 Age: 77 Y 3 M 10 D [id number] Client [facility] Day - Client Client Site [facility] - Day Provider Copland, Shanda Bumps- MD Contact Type Call Who Is Calling Patient / Member / Family / Caregiver Call Type Triage / Clinical Relationship To Patient Self Return Phone Number [phone] (Primary) Chief Complaint Medication reaction Reason for Call Symptomatic / Request for Health Information Initial Comment Caller states she is on medication and now she is having problems with blurred vision. Translation No Nurse Assessment Nurse: [place], RN, Marchelle Folks Date/Time (Eastern Time): 06/29/2023 10:01:06 AM Confirm and document reason for call. If symptomatic, describe symptoms. ---caller states she started having light blurry vision after started a new medication. does not happen all the time. was thinking she was needing to go to the eye doctor. glipizide 2.5mg  ER and blurry vision is not getting worse. Does the patient have any new or worsening symptoms? ---Yes Will a triage be completed? ---Yes Related visit to physician within the last 2 weeks? ---Yes Does the PT have any chronic conditions? (i.e. diabetes, asthma, this includes High risk factors for pregnancy, etc.) ---Yes List chronic conditions. ---DM Is this a behavioral health or substance abuse call? ---No Guidelines Guideline Title Affirmed Question Affirmed Notes Nurse Date/Time (Eastern Time) Vision Loss or Change [1] Blurred vision or visual changes AND [2] present now AND [3] sudden onset or new (e.g., minutes, hours, days) (Exception: Seeing floaters / black

## 2023-07-18 NOTE — Patient Instructions (Incomplete)
It was good to see you today, recommend flu shot and COVID booster this fall  Since we had to stop glipizide, lets increase your dose of metformin to help keep your blood sugars down  Please take metformin 500 mg twice a day for about 2 weeks.  Assuming you tolerate this well, increase to a whole tab in the morning and half in the evening for 1 to 2 weeks, then can increase to a whole tablet twice a day.  If you have symptoms such as diarrhea or excessive gas please let me know  Lets plan to follow-up in about 4 months to check your A1c  If the vision change does come back, please let me know and also see your eye doctor!

## 2023-07-18 NOTE — Progress Notes (Unsigned)
[facility] [address], [address] [place], [place] [address] [phone]  Date:  07/19/2023   Name:  Natalie Fowler   DOB:  02-Jan-1946   MRN:  [id number]  PCP:  Pearline Cables, MD    Chief Complaint: No chief complaint on file.   History of Present Illness:  Natalie Fowler is a 77 y.o. 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   Dyslipidemia 05/02/2021   Diet-controlled diabetes mellitus (HCC) 01/20/2021   Low back pain 11/19/2016   History of vitamin D deficiency 09/30/2016   H/O colonoscopy with polypectomy 09/30/2015   Vaginal atrophy 09/25/2014   Vertigo    Osteopenia    Acid reflux     Past Medical History:  Diagnosis Date   Acid reflux    Arthritis    Darnelle Bos tumor    benign   Osteopenia    Ovarian cyst, bilateral    Pneumonia    PONV (postoperative nausea and vomiting)    Pre-diabetes    Vertigo    Vitamin D deficiency     Past Surgical History:  Procedure Laterality Date   APPENDECTOMY     CHOLECYSTECTOMY     CYST EXCISION     CHEST    JOINT REPLACEMENT     OOPHORECTOMY  10/23/2006   DIAG LAP WITH BSO   PELVIC LAPAROSCOPY  10/23/2006   DIAG LAP WITH BSO   ROTATOR CUFF REPAIR     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   Procedure: TOTAL KNEE ARTHROPLASTY;  Surgeon: Durene Romans, MD;  Location: [facility];  Service: Orthopedics;  Laterality: Left;    Social History   Tobacco  Use   Smoking status: Never   Smokeless tobacco: Never  Vaping Use   Vaping status: Never Used  Substance Use Topics   Alcohol use: No    Alcohol/week: 0.0 standard drinks of alcohol   Drug use: Never    Family History  Problem Relation Age of Onset   Diabetes Mother    Hypertension Mother    Cancer Father        COLON CA   Osteoporosis Sister    Cancer Sister 42       PANCREATIC   Cancer Maternal Aunt        OVARIAN CANCER   Heart disease Maternal Aunt    Hypertension Daughter    Cancer Paternal Uncle        Colon cancer   Breast cancer Neg Hx     Allergies  Allergen Reactions   Pantoprazole Cough   Vioxx [Rofecoxib] Rash    Medication list has been reviewed and updated.  Current Outpatient Medications on File Prior to Visit  Medication Sig Dispense Refill   albuterol (VENTOLIN HFA) 108 (90 Base) MCG/ACT inhaler Inhale 2 puffs into the lungs every 6 (six) hours as needed for wheezing or shortness of breath. 8 g 2   albuterol (VENTOLIN HFA) 108 (  90 Base) MCG/ACT inhaler Inhale 2 puffs into the lungs every 6 (six) hours as needed for wheezing or shortness of breath. 1 each 0   aspirin 81 MG chewable tablet Chew by mouth daily.     Blood Glucose Monitoring Suppl DEVI 1 each by Does not apply route daily as needed. May substitute to any manufacturer covered by patient's insurance. 1 each 0   cholecalciferol (VITAMIN D) 1000 units tablet Take 1,000 Units by mouth daily.     famotidine (PEPCID) 40 MG tablet Take by mouth.     glipiZIDE (GLUCOTROL XL) 2.5 MG 24 hr tablet Take 1 tablet (2.5 mg total) by mouth daily with breakfast. 90 tablet 1   Glucose Blood (BLOOD GLUCOSE TEST STRIPS) STRP Use as needed to monitor blood sugar once a day. May substitute to any manufacturer covered by patient's insurance. 100 strip 2   losartan (COZAAR) 25 MG tablet Take 1 tablet (25 mg total) by mouth daily. 90 tablet 3   meclizine (ANTIVERT) 25 MG tablet Take 1 tablet (25 mg total) by mouth  3 (three) times daily as needed for dizziness. 30 tablet 0   metFORMIN (GLUCOPHAGE) 500 MG tablet Take 1 tablet by mouth once daily 90 tablet 0   raloxifene (EVISTA) 60 MG tablet Take 1 tablet by mouth once daily 90 tablet 1   simvastatin (ZOCOR) 20 MG tablet Take 1 tablet (20 mg total) by mouth at bedtime. 90 tablet 3   No current facility-administered medications on file prior to visit.    Review of Systems:  As per HPI- otherwise negative.   Physical Examination: There were no vitals filed for this visit. There were no vitals filed for this visit. There is no height or weight on file to calculate BMI. Ideal Body Weight:    GEN: no acute distress. HEENT: Atraumatic, Normocephalic.  Ears and Nose: No external deformity. CV: RRR, No M/G/R. No JVD. No thrill. No extra heart sounds. PULM: CTA B, no wheezes, crackles, rhonchi. No retractions. No resp. distress. No accessory muscle use. ABD: S, NT, ND, +BS. No rebound. No HSM. EXTR: No c/c/e PSYCH: Normally interactive. Conversant.    Assessment and Plan: ***  Signed Abbe Amsterdam, MD

## 2023-07-19 ENCOUNTER — Ambulatory Visit (INDEPENDENT_AMBULATORY_CARE_PROVIDER_SITE_OTHER): Payer: PPO | Admitting: Family Medicine

## 2023-07-19 VITALS — BP 130/80 | HR 89 | Temp 98.1°F | Resp 18 | Ht 60.0 in | Wt 162.8 lb

## 2023-07-19 DIAGNOSIS — H538 Other visual disturbances: Secondary | ICD-10-CM | POA: Diagnosis not present

## 2023-07-19 DIAGNOSIS — Z7984 Long term (current) use of oral hypoglycemic drugs: Secondary | ICD-10-CM

## 2023-07-19 DIAGNOSIS — M8589 Other specified disorders of bone density and structure, multiple sites: Secondary | ICD-10-CM | POA: Diagnosis not present

## 2023-07-19 DIAGNOSIS — E119 Type 2 diabetes mellitus without complications: Secondary | ICD-10-CM

## 2023-07-19 MED ORDER — METFORMIN HCL 1000 MG PO TABS: 1000.0000 mg | ORAL_TABLET | Freq: Two times a day (BID) | ORAL | 3 refills | Status: DC

## 2023-07-19 MED ORDER — RALOXIFENE HCL 60 MG PO TABS: 60.0000 mg | ORAL_TABLET | Freq: Every day | ORAL | 3 refills | Status: DC

## 2023-08-04 ENCOUNTER — Other Ambulatory Visit: Payer: Self-pay | Admitting: Family Medicine

## 2023-08-04 DIAGNOSIS — E785 Hyperlipidemia, unspecified: Secondary | ICD-10-CM

## 2023-08-05 ENCOUNTER — Other Ambulatory Visit: Payer: Self-pay | Admitting: Family Medicine

## 2023-08-05 DIAGNOSIS — Z1231 Encounter for screening mammogram for malignant neoplasm of breast: Secondary | ICD-10-CM

## 2023-09-01 DIAGNOSIS — Z08 Encounter for follow-up examination after completed treatment for malignant neoplasm: Secondary | ICD-10-CM | POA: Diagnosis not present

## 2023-09-01 DIAGNOSIS — L821 Other seborrheic keratosis: Secondary | ICD-10-CM | POA: Diagnosis not present

## 2023-09-01 DIAGNOSIS — L814 Other melanin hyperpigmentation: Secondary | ICD-10-CM | POA: Diagnosis not present

## 2023-09-01 DIAGNOSIS — D225 Melanocytic nevi of trunk: Secondary | ICD-10-CM | POA: Diagnosis not present

## 2023-09-01 DIAGNOSIS — Z85828 Personal history of other malignant neoplasm of skin: Secondary | ICD-10-CM | POA: Diagnosis not present

## 2023-09-06 DIAGNOSIS — M1711 Unilateral primary osteoarthritis, right knee: Secondary | ICD-10-CM | POA: Diagnosis not present

## 2023-09-06 DIAGNOSIS — Z96652 Presence of left artificial knee joint: Secondary | ICD-10-CM | POA: Diagnosis not present

## 2023-09-15 ENCOUNTER — Telehealth: Payer: Self-pay | Admitting: Family Medicine

## 2023-09-15 NOTE — Telephone Encounter (Signed)
Copied from CRM [id number]. Topic: Medicare AWV >> Sep 15, 2023  9:46 AM Payton Doughty wrote: Reason for CRM: Called LVM 09/15/2023 to schedule Annual Wellness Visit  Verlee Rossetti; Care Guide Ambulatory Clinical Support [facility] l [facility] Direct Dial: [phone]

## 2023-09-17 ENCOUNTER — Ambulatory Visit (INDEPENDENT_AMBULATORY_CARE_PROVIDER_SITE_OTHER): Payer: PPO

## 2023-09-17 DIAGNOSIS — Z0000 Encounter for general adult medical examination without abnormal findings: Secondary | ICD-10-CM

## 2023-09-17 NOTE — Patient Instructions (Addendum)
Natalie Fowler , Thank you for taking time to come for your Medicare Wellness Visit. I appreciate your ongoing commitment to your health goals. Please review the following plan we discussed and let me know if I can assist you in the future.   Referrals/Orders/Follow-Ups/Clinician Recommendations: none  This is a list of the screening recommended for you and due dates:  Health Maintenance  Topic Date Due   Flu Shot  06/24/2023   COVID-19 Vaccine (6 - 2023-24 season) 07/25/2023   Complete foot exam   09/15/2023   Hemoglobin A1C  11/24/2023   Yearly kidney health urinalysis for diabetes  01/12/2024   Eye exam for diabetics  02/22/2024   Yearly kidney function blood test for diabetes  06/28/2024   DTaP/Tdap/Td vaccine (3 - Td or Tdap) 08/28/2024   Medicare Annual Wellness Visit  09/16/2024   Colon Cancer Screening  09/12/2028   Pneumonia Vaccine  Completed   DEXA scan (bone density measurement)  Completed   Hepatitis C Screening  Completed   Zoster (Shingles) Vaccine  Completed   HPV Vaccine  Aged Out    Advanced directives: (ACP Link)Information on Advanced Care Planning can be found at [facility] of [place] Advance Health Care Directives Advance Health Care Directives ([email or web address]   Next Medicare Annual Wellness Visit scheduled for next year: Yes   09/19/24 @ 3:30 pm by phone

## 2023-09-17 NOTE — Progress Notes (Signed)
Subjective:   Natalie Fowler is a 77 y.o. female who presents for Medicare Annual (Subsequent) preventive examination.  Visit Complete: Virtual I connected with  Billey Co on 09/17/23 by a audio enabled telemedicine application and verified that I am speaking with the correct person using two identifiers.  Patient Location: Home  Provider Location: Office/Clinic  I discussed the limitations of evaluation and management by telemedicine. The patient expressed understanding and agreed to proceed.  Vital Signs: Because this visit was a virtual/telehealth visit, some criteria may be missing or patient reported. Any vitals not documented were not able to be obtained and vitals that have been documented are patient reported.   Cardiac Risk Factors include: advanced age (>58men, >61 women);dyslipidemia;diabetes mellitus;obesity (BMI >30kg/m2)     Objective:    There were no vitals filed for this visit. There is no height or weight on file to calculate BMI.     09/17/2023    3:42 PM 06/29/2023   12:49 PM 09/15/2022    8:22 AM 09/09/2021    9:07 AM 06/03/2021   12:45 PM 05/28/2021    9:09 AM  Advanced Directives  Does Patient Have a Medical Advance Directive? No Yes Yes Yes Yes Yes  Type of Special educational needs teacher of [place];Living will Healthcare Power of [place];Living will Healthcare Power of [place];Living will Healthcare Power of [place];Living will Living will  Does patient want to make changes to medical advance directive?   No - Patient declined  No - Guardian declined   Copy of Healthcare Power of Attorney in Chart?   No - copy requested No - copy requested    Would patient like information on creating a medical advance directive? No - Patient declined         Current Medications (verified) Outpatient Encounter Medications as of 09/17/2023  Medication Sig   albuterol (VENTOLIN HFA) 108 (90 Base) MCG/ACT inhaler Inhale 2 puffs into the lungs every 6 (six)  hours as needed for wheezing or shortness of breath.   albuterol (VENTOLIN HFA) 108 (90 Base) MCG/ACT inhaler Inhale 2 puffs into the lungs every 6 (six) hours as needed for wheezing or shortness of breath.   Blood Glucose Monitoring Suppl DEVI 1 each by Does not apply route daily as needed. May substitute to any manufacturer covered by patient's insurance.   cholecalciferol (VITAMIN D) 1000 units tablet Take 1,000 Units by mouth daily.   famotidine (PEPCID) 40 MG tablet Take by mouth.   Glucose Blood (BLOOD GLUCOSE TEST STRIPS) STRP Use as needed to monitor blood sugar once a day. May substitute to any manufacturer covered by patient's insurance.   losartan (COZAAR) 25 MG tablet Take 1 tablet (25 mg total) by mouth daily.   meclizine (ANTIVERT) 25 MG tablet Take 1 tablet (25 mg total) by mouth 3 (three) times daily as needed for dizziness.   metFORMIN (GLUCOPHAGE) 1000 MG tablet Take 1 tablet (1,000 mg total) by mouth 2 (two) times daily with a meal.   raloxifene (EVISTA) 60 MG tablet Take 1 tablet (60 mg total) by mouth daily.   simvastatin (ZOCOR) 20 MG tablet Take 1 tablet (20 mg total) by mouth at bedtime.   aspirin 81 MG chewable tablet Chew by mouth daily. (Patient not taking: Reported on 09/17/2023)   No facility-administered encounter medications on file as of 09/17/2023.    Allergies (verified) Pantoprazole and Vioxx [rofecoxib]   History: Past Medical History:  Diagnosis Date   Acid reflux    Arthritis  Brenner tumor    benign   Osteopenia    Ovarian cyst, bilateral    Pneumonia    PONV (postoperative nausea and vomiting)    Pre-diabetes    Vertigo    Vitamin D deficiency    Past Surgical History:  Procedure Laterality Date   APPENDECTOMY     CHOLECYSTECTOMY     CYST EXCISION     CHEST    JOINT REPLACEMENT     OOPHORECTOMY  10/23/2006   DIAG LAP WITH BSO   PELVIC LAPAROSCOPY  10/23/2006   DIAG LAP WITH BSO   ROTATOR CUFF REPAIR     TOTAL KNEE ARTHROPLASTY  Left 06/03/2021   Procedure: TOTAL KNEE ARTHROPLASTY;  Surgeon: Durene Romans, MD;  Location: [facility];  Service: Orthopedics;  Laterality: Left;   Family History  Problem Relation Age of Onset   Diabetes Mother    Hypertension Mother    Cancer Father        COLON CA   Osteoporosis Sister    Cancer Sister 83       PANCREATIC   Cancer Maternal Aunt        OVARIAN CANCER   Heart disease Maternal Aunt    Hypertension Daughter    Cancer Paternal Uncle        Colon cancer   Breast cancer Neg Hx    Social History   Socioeconomic History   Marital status: Married    Spouse name: Not on file   Number of children: Not on file   Years of education: Not on file   Highest education level: Not on file  Occupational History   Not on file  Tobacco Use   Smoking status: Never   Smokeless tobacco: Never  Vaping Use   Vaping status: Never Used  Substance and Sexual Activity   Alcohol use: No    Alcohol/week: 0.0 standard drinks of alcohol   Drug use: Never   Sexual activity: Yes    Partners: Male    Birth control/protection: Post-menopausal    Comment: 1st intercourse- 24, partners- 1, married- 42 yrs   Other Topics Concern   Not on file  Social History Narrative   Not on file   Social Determinants of Health   Financial Resource Strain: Low Risk  (09/17/2023)   Overall Financial Resource Strain (CARDIA)    Difficulty of Paying Living Expenses: Not hard at all  Food Insecurity: No Food Insecurity (09/17/2023)   Hunger Vital Sign    Worried About Running Out of Food in the Last Year: Never true    Ran Out of Food in the Last Year: Never true  Transportation Needs: No Transportation Needs (09/17/2023)   PRAPARE - Administrator, Civil Service (Medical): No    Lack of Transportation (Non-Medical): No  Physical Activity: Sufficiently Active (09/17/2023)   Exercise Vital Sign    Days of Exercise per Week: 5 days    Minutes of Exercise per Session: 30 min  Stress: No  Stress Concern Present (09/17/2023)   [employer] of Occupational Health - Occupational Stress Questionnaire    Feeling of Stress : Not at all  Social Connections: Moderately Integrated (09/17/2023)   Social Connection and Isolation Panel [NHANES]    Frequency of Communication with Friends and Family: More than three times a week    Frequency of Social Gatherings with Friends and Family: Once a week    Attends Religious Services: Never    Database administrator or  Organizations: Yes    Attends Engineer, structural: More than 4 times per year    Marital Status: Married    Tobacco Counseling Counseling given: Not Answered   Clinical Intake:  Pre-visit preparation completed: Yes  Pain : No/denies pain     Nutritional Status: BMI > 30  Obese Nutritional Risks: None Diabetes: Yes CBG done?: No Did pt. bring in CBG monitor from home?: No  How often do you need to have someone help you when you read instructions, pamphlets, or other written materials from your doctor or pharmacy?: 1 - Never  Interpreter Needed?: No  Information entered by :: Kennedy Bucker, LPN   Activities of Daily Living    09/17/2023    3:43 PM  In your present state of health, do you have any difficulty performing the following activities:  Hearing? 0  Vision? 0  Difficulty concentrating or making decisions? 0  Walking or climbing stairs? 0  Dressing or bathing? 0  Doing errands, shopping? 0  Preparing Food and eating ? N  Using the Toilet? N  In the past six months, have you accidently leaked urine? N  Do you have problems with loss of bowel control? N  Managing your Medications? N  Managing your Finances? N  Housekeeping or managing your Housekeeping? N    Patient Care Team: Copland, Gwenlyn Found, MD as PCP - General (Family Medicine)  Indicate any recent Medical Services you may have received from other than [facility] providers in the past year (date may be approximate).      Assessment:   This is a routine wellness examination for [place].  Hearing/Vision screen Hearing Screening - Comments:: No aids Vision Screening - Comments:: Wears glasses- Dr.Papadino   Goals Addressed             This Visit's Progress    DIET - EAT MORE FRUITS AND VEGETABLES         Depression Screen    09/17/2023    3:40 PM 01/11/2023    3:43 PM 09/15/2022    8:21 AM 09/14/2022    8:40 AM 09/09/2021    9:11 AM 05/07/2021    1:13 PM 01/20/2021    9:35 AM  PHQ 2/9 Scores  PHQ - 2 Score 0 0 0 0 0 0 0  PHQ- 9 Score 0          Fall Risk    09/17/2023    3:43 PM 01/11/2023    3:43 PM 09/15/2022    8:21 AM 09/14/2022    8:40 AM 09/09/2021    9:10 AM  Fall Risk   Falls in the past year? 0 0 0 0 0  Number falls in past yr: 0 0 0 0 0  Injury with Fall? 0 0 0 0 0  Risk for fall due to : No Fall Risks No Fall Risks No Fall Risks    Follow up Falls prevention discussed;Falls evaluation completed Falls evaluation completed Falls evaluation completed Falls evaluation completed Falls prevention discussed    MEDICARE RISK AT HOME: Medicare Risk at Home Any stairs in or around the home?: Yes If so, are there any without handrails?: No Home free of loose throw rugs in walkways, pet beds, electrical cords, etc?: Yes Adequate lighting in your home to reduce risk of falls?: Yes Life alert?: No Use of a cane, walker or w/c?: No Grab bars in the bathroom?: Yes Shower chair or bench in shower?: Yes Elevated toilet seat or a handicapped  toilet?: No  TIMED UP AND GO:  Was the test performed?  No    Cognitive Function:        09/17/2023    3:44 PM 09/15/2022    8:30 AM  6CIT Screen  What Year? 0 points 0 points  What month? 0 points 0 points  What time? 0 points 0 points  Count back from 20 0 points 2 points  Months in reverse 0 points 0 points  Repeat phrase 2 points 4 points  Total Score 2 points 6 points    Immunizations Immunization History  Administered  Date(s) Administered   Fluad Quad(high Dose 65+) 09/26/2020, 09/11/2021, 09/14/2022   PFIZER(Purple Top)SARS-COV-2 Vaccination 12/29/2019, 01/23/2020, 08/25/2020, 08/31/2022   Pfizer Covid-19 Vaccine Bivalent Booster 5y-11y 09/15/2021   Pneumococcal Conjugate-13 08/28/2014   Pneumococcal Polysaccharide-23 09/16/2015   Td 08/28/2014   Tdap 09/21/2013   Zoster Recombinant(Shingrix) 11/07/2018, 03/08/2019    TDAP status: Up to date  Flu Vaccine status: Due, Education has been provided regarding the importance of this vaccine. Advised may receive this vaccine at local pharmacy or Health Dept. Aware to provide a copy of the vaccination record if obtained from local pharmacy or Health Dept. Verbalized acceptance and understanding.  Pneumococcal vaccine status: Up to date  Covid-19 vaccine status: Completed vaccines  Qualifies for Shingles Vaccine? Yes   Zostavax completed No   Shingrix Completed?: Yes  Screening Tests Health Maintenance  Topic Date Due   INFLUENZA VACCINE  06/24/2023   COVID-19 Vaccine (6 - 2023-24 season) 07/25/2023   FOOT EXAM  09/15/2023   HEMOGLOBIN A1C  11/24/2023   Diabetic kidney evaluation - Urine ACR  01/12/2024   OPHTHALMOLOGY EXAM  02/22/2024   Diabetic kidney evaluation - eGFR measurement  06/28/2024   DTaP/Tdap/Td (3 - Td or Tdap) 08/28/2024   Medicare Annual Wellness (AWV)  09/16/2024   Colonoscopy  09/12/2028   Pneumonia Vaccine 29+ Years old  Completed   DEXA SCAN  Completed   Hepatitis C Screening  Completed   Zoster Vaccines- Shingrix  Completed   HPV VACCINES  Aged Out    Health Maintenance  Health Maintenance Due  Topic Date Due   INFLUENZA VACCINE  06/24/2023   COVID-19 Vaccine (6 - 2023-24 season) 07/25/2023   FOOT EXAM  09/15/2023    Colorectal cancer screening: Type of screening: Colonoscopy. Completed 09/12/18. Repeat every 10 years  Mammogram status: Completed SCHEDULED FOR 11/09/23. Repeat every year  Bone Density status:  Completed 01/14/23. Results reflect: Bone density results: NORMAL. Repeat every 5 years.  Lung Cancer Screening: (Low Dose CT Chest recommended if Age 27-80 years, 20 pack-year currently smoking OR have quit w/in 15years.) does not qualify.     Additional Screening:  Hepatitis C Screening: does qualify; Completed 09/21/13  Vision Screening: Recommended annual ophthalmology exams for early detection of glaucoma and other disorders of the eye. Is the patient up to date with their annual eye exam?  Yes  Who is the provider or what is the name of the office in which the patient attends annual eye exams? Dr.Papadino If pt is not established with a provider, would they like to be referred to a provider to establish care? No .   Dental Screening: Recommended annual dental exams for proper oral hygiene  Diabetic Foot Exam: Diabetic Foot Exam: Completed 09/14/22  Community Resource Referral / Chronic Care Management: CRR required this visit?  No   CCM required this visit?  No     Plan:  I have personally reviewed and noted the following in the patient's chart:   Medical and social history Use of alcohol, tobacco or illicit drugs  Current medications and supplements including opioid prescriptions. Patient is not currently taking opioid prescriptions. Functional ability and status Nutritional status Physical activity Advanced directives List of other physicians Hospitalizations, surgeries, and ER visits in previous 12 months Vitals Screenings to include cognitive, depression, and falls Referrals and appointments  In addition, I have reviewed and discussed with patient certain preventive protocols, quality metrics, and best practice recommendations. A written personalized care plan for preventive services as well as general preventive health recommendations were provided to patient.     Hal Hope, LPN   [id number]/08/[id number]   After Visit Summary: (MyChart) Due to this being a  telephonic visit, the after visit summary with patients personalized plan was offered to patient via MyChart   Nurse Notes: none

## 2023-09-24 DIAGNOSIS — N3010 Interstitial cystitis (chronic) without hematuria: Secondary | ICD-10-CM | POA: Diagnosis not present

## 2023-09-24 NOTE — Progress Notes (Unsigned)
[facility] [address] [place], [place] [address] [phone]  Date:  09/27/2023   Name:  Natalie Fowler   DOB:  February 09, 1946   MRN:  [id number]  PCP:  Pearline Cables, MD    Chief Complaint: No chief complaint on file.   History of Present Illness:  Natalie Fowler is a 77 y.o. very pleasant female patient who presents with the following:  Patient seen today for periodic follow-up  History of osteopenia, not insulin-dependent diabetes, dyslipidemia, gastric ulcer being managed by GI   Most recent visit with myself was in August-at that time she had recently been in the ER with blurred vision.  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   Dyslipidemia 05/02/2021   Diet-controlled diabetes mellitus (HCC) 01/20/2021   Low back pain 11/19/2016   History of vitamin D deficiency 09/30/2016   H/O colonoscopy with polypectomy 09/30/2015   Vaginal atrophy 09/25/2014   Vertigo    Osteopenia    Acid reflux     Past Medical History:  Diagnosis Date   Acid reflux    Arthritis    Darnelle Bos tumor    benign   Osteopenia    Ovarian cyst, bilateral    Pneumonia    PONV (postoperative nausea and vomiting)    Pre-diabetes    Vertigo    Vitamin D deficiency     Past Surgical History:  Procedure Laterality Date   APPENDECTOMY     CHOLECYSTECTOMY      CYST EXCISION     CHEST    JOINT REPLACEMENT     OOPHORECTOMY  10/23/2006   DIAG LAP WITH BSO   PELVIC LAPAROSCOPY  10/23/2006   DIAG LAP WITH BSO   ROTATOR CUFF REPAIR     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   Procedure: TOTAL KNEE ARTHROPLASTY;  Surgeon: Durene Romans, MD;  Location: [facility];  Service: Orthopedics;  Laterality: Left;    Social History   Tobacco Use   Smoking status: Never   Smokeless tobacco: Never  Vaping Use   Vaping status: Never Used  Substance Use Topics   Alcohol use: No    Alcohol/week: 0.0 standard drinks of alcohol   Drug use: Never    Family History  Problem Relation Age of Onset   Diabetes Mother    Hypertension Mother    Cancer Father        COLON CA   Osteoporosis Sister    Cancer Sister 36       PANCREATIC   Cancer Maternal Aunt        OVARIAN CANCER   Heart disease Maternal Aunt    Hypertension Daughter    Cancer Paternal Uncle        Colon cancer   Breast cancer Neg Hx     Allergies  Allergen Reactions   Pantoprazole  Cough   Vioxx [Rofecoxib] Rash    Medication list has been reviewed and updated.  Current Outpatient Medications on File Prior to Visit  Medication Sig Dispense Refill   albuterol (VENTOLIN HFA) 108 (90 Base) MCG/ACT inhaler Inhale 2 puffs into the lungs every 6 (six) hours as needed for wheezing or shortness of breath. 8 g 2   albuterol (VENTOLIN HFA) 108 (90 Base) MCG/ACT inhaler Inhale 2 puffs into the lungs every 6 (six) hours as needed for wheezing or shortness of breath. 1 each 0   aspirin 81 MG chewable tablet Chew by mouth daily. (Patient not taking: Reported on 09/17/2023)     Blood Glucose Monitoring Suppl DEVI 1 each by Does not apply route daily as needed. May substitute to any manufacturer covered by patient's insurance. 1 each 0   cholecalciferol (VITAMIN D) 1000 units tablet Take 1,000 Units by mouth daily.     famotidine (PEPCID) 40 MG tablet Take by mouth.     Glucose Blood (BLOOD GLUCOSE  TEST STRIPS) STRP Use as needed to monitor blood sugar once a day. May substitute to any manufacturer covered by patient's insurance. 100 strip 2   losartan (COZAAR) 25 MG tablet Take 1 tablet (25 mg total) by mouth daily. 90 tablet 3   meclizine (ANTIVERT) 25 MG tablet Take 1 tablet (25 mg total) by mouth 3 (three) times daily as needed for dizziness. 30 tablet 0   metFORMIN (GLUCOPHAGE) 1000 MG tablet Take 1 tablet (1,000 mg total) by mouth 2 (two) times daily with a meal. 180 tablet 3   raloxifene (EVISTA) 60 MG tablet Take 1 tablet (60 mg total) by mouth daily. 90 tablet 3   simvastatin (ZOCOR) 20 MG tablet Take 1 tablet (20 mg total) by mouth at bedtime. 90 tablet 1   No current facility-administered medications on file prior to visit.    Review of Systems:  As per HPI- otherwise negative.   Physical Examination: There were no vitals filed for this visit. There were no vitals filed for this visit. There is no height or weight on file to calculate BMI. Ideal Body Weight:    GEN: no acute distress. HEENT: Atraumatic, Normocephalic.  Ears and Nose: No external deformity. CV: RRR, No M/G/R. No JVD. No thrill. No extra heart sounds. PULM: CTA B, no wheezes, crackles, rhonchi. No retractions. No resp. distress. No accessory muscle use. ABD: S, NT, ND, +BS. No rebound. No HSM. EXTR: No c/c/e PSYCH: Normally interactive. Conversant.  Foot exam  Assessment and Plan: ***  Signed Abbe Amsterdam, MD

## 2023-09-24 NOTE — Patient Instructions (Incomplete)
It was great to see you again today, I will be in touch with your lab work If not done already recommend seasonal flu shot, COVID booster, RSV  Assuming all is well we can plan to visit in about 6 months

## 2023-09-27 ENCOUNTER — Other Ambulatory Visit (HOSPITAL_BASED_OUTPATIENT_CLINIC_OR_DEPARTMENT_OTHER): Payer: Self-pay

## 2023-09-27 ENCOUNTER — Ambulatory Visit: Payer: PPO | Admitting: Family Medicine

## 2023-09-27 ENCOUNTER — Encounter: Payer: Self-pay | Admitting: Family Medicine

## 2023-09-27 ENCOUNTER — Ambulatory Visit (INDEPENDENT_AMBULATORY_CARE_PROVIDER_SITE_OTHER): Payer: PPO | Admitting: Family Medicine

## 2023-09-27 VITALS — BP 122/60 | HR 79 | Temp 97.9°F | Resp 18 | Ht 60.0 in | Wt 163.2 lb

## 2023-09-27 DIAGNOSIS — E785 Hyperlipidemia, unspecified: Secondary | ICD-10-CM | POA: Diagnosis not present

## 2023-09-27 DIAGNOSIS — Z23 Encounter for immunization: Secondary | ICD-10-CM | POA: Diagnosis not present

## 2023-09-27 DIAGNOSIS — R300 Dysuria: Secondary | ICD-10-CM | POA: Diagnosis not present

## 2023-09-27 DIAGNOSIS — E119 Type 2 diabetes mellitus without complications: Secondary | ICD-10-CM | POA: Diagnosis not present

## 2023-09-27 DIAGNOSIS — Z7984 Long term (current) use of oral hypoglycemic drugs: Secondary | ICD-10-CM

## 2023-09-27 DIAGNOSIS — M8589 Other specified disorders of bone density and structure, multiple sites: Secondary | ICD-10-CM | POA: Diagnosis not present

## 2023-09-27 LAB — POCT URINALYSIS DIP (MANUAL ENTRY)
Bilirubin, UA: NEGATIVE
Glucose, UA: NEGATIVE mg/dL
Ketones, POC UA: NEGATIVE mg/dL
Nitrite, UA: NEGATIVE
Protein Ur, POC: NEGATIVE mg/dL
Spec Grav, UA: 1.02 (ref 1.010–1.025)
Urobilinogen, UA: 0.2 U/dL
pH, UA: 5 (ref 5.0–8.0)

## 2023-09-27 LAB — LIPID PANEL
Cholesterol: 143 mg/dL (ref 0–200)
HDL: 50 mg/dL (ref >39.00)
LDL Cholesterol: 66 mg/dL (ref 0–99)
NonHDL: 92.55
Total CHOL/HDL Ratio: 3
Triglycerides: 132 mg/dL (ref 0.0–149.0)
VLDL: 26.4 mg/dL (ref 0.0–40.0)

## 2023-09-27 LAB — HEMOGLOBIN A1C: Hgb A1c MFr Bld: 6.5 % (ref 4.6–6.5)

## 2023-09-27 LAB — URINALYSIS, MICROSCOPIC ONLY

## 2023-09-27 LAB — BASIC METABOLIC PANEL
BUN: 17 mg/dL (ref 6–23)
CO2: 29 meq/L (ref 19–32)
Calcium: 9.5 mg/dL (ref 8.4–10.5)
Chloride: 104 meq/L (ref 96–112)
Creatinine, Ser: 1.02 mg/dL (ref 0.40–1.20)
GFR: 53.81 mL/min — ABNORMAL LOW (ref >60.00)
Glucose, Bld: 116 mg/dL — ABNORMAL HIGH (ref 70–99)
Potassium: 5 meq/L (ref 3.5–5.1)
Sodium: 141 meq/L (ref 135–145)

## 2023-09-27 MED ORDER — CEPHALEXIN 500 MG PO CAPS
500.0000 mg | ORAL_CAPSULE | Freq: Two times a day (BID) | ORAL | 0 refills | Status: DC
Start: 2023-09-27 — End: 2024-06-19

## 2023-09-28 LAB — URINE CULTURE
MICRO NUMBER:: 15681715
Result:: NO GROWTH
SPECIMEN QUALITY:: ADEQUATE

## 2023-09-29 ENCOUNTER — Encounter: Payer: Self-pay | Admitting: Family Medicine

## 2023-10-27 NOTE — Progress Notes (Signed)
77 y.o. [id number] Married Caucasian female here for a breast and pelvic exam.    Has a little low back pain once in a while.   BMD 01/14/23 at [facility]:  T score spine -1.0, left hip -1.1.  Taking Evista through her PCP.   Husband had a stoke and has some memory issues.  She cooks for her family.   PCP: Copland, Gwenlyn Found, MD   No LMP recorded. Patient is postmenopausal.           Sexually active: Yes.    The current method of family planning is post menopausal status.    Menopausal hormone therapy:  n/a Exercising: Yes.     walking Smoker:  no  OB History     Gravida  3   Para  2   Term      Preterm      AB  1   Living  2      SAB  1   IAB      Ectopic  0   Multiple      Live Births              HEALTH MAINTENANCE: Last 2 paps: 10/30/20 neg, 10/22/17 neg History of abnormal Pap or positive HPV:  no Mammogram: done 11/09/23,  11/06/22 Breast Density Cat B, BI-RADS CAT 1 neg Colonoscopy:  2 years ago per pt Bone Density: 01/14/23  Result  ostopenic   Immunization History  Administered Date(s) Administered   Fluad Quad(high Dose 65+) 09/26/2020, 09/11/2021, 09/14/2022   Fluad Trivalent(High Dose 65+) 09/27/2023   PFIZER(Purple Top)SARS-COV-2 Vaccination 12/29/2019, 01/23/2020, 08/25/2020, 08/31/2022   Pfizer Covid-19 Vaccine Bivalent Booster 5y-11y 09/15/2021   Pneumococcal Conjugate-13 08/28/2014   Pneumococcal Polysaccharide-23 09/16/2015   Td 08/28/2014   Tdap 09/21/2013   Zoster Recombinant(Shingrix) 11/07/2018, 03/08/2019      reports that she has never smoked. She has never used smokeless tobacco. She reports that she does not drink alcohol and does not use drugs.  Past Medical History:  Diagnosis Date   Acid reflux    Arthritis    Darnelle Bos tumor    benign   Osteopenia    Ovarian cyst, bilateral    Pneumonia    PONV (postoperative nausea and vomiting)    Pre-diabetes    Vertigo    Vitamin D deficiency     Past  Surgical History:  Procedure Laterality Date   APPENDECTOMY     CHOLECYSTECTOMY     CYST EXCISION     CHEST    JOINT REPLACEMENT     OOPHORECTOMY  10/23/2006   DIAG LAP WITH BSO   PELVIC LAPAROSCOPY  10/23/2006   DIAG LAP WITH BSO   ROTATOR CUFF REPAIR     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   Procedure: TOTAL KNEE ARTHROPLASTY;  Surgeon: Durene Romans, MD;  Location: [facility];  Service: Orthopedics;  Laterality: Left;    Current Outpatient Medications  Medication Sig Dispense Refill   albuterol (VENTOLIN HFA) 108 (90 Base) MCG/ACT inhaler Inhale 2 puffs into the lungs every 6 (six) hours as needed for wheezing or shortness of breath. 8 g 2   albuterol (VENTOLIN HFA) 108 (90 Base) MCG/ACT inhaler Inhale 2 puffs into the lungs every 6 (six) hours as needed for wheezing or shortness of breath. 1 each 0   aspirin 81 MG chewable tablet Chew by mouth daily.     Blood Glucose Monitoring Suppl DEVI 1 each by Does not apply  route daily as needed. May substitute to any manufacturer covered by patient's insurance. 1 each 0   cephALEXin (KEFLEX) 500 MG capsule Take 1 capsule (500 mg total) by mouth 2 (two) times daily. 14 capsule 0   cholecalciferol (VITAMIN D) 1000 units tablet Take 2,000 Units by mouth daily.     famotidine (PEPCID) 40 MG tablet Take by mouth.     Glucose Blood (BLOOD GLUCOSE TEST STRIPS) STRP Use as needed to monitor blood sugar once a day. May substitute to any manufacturer covered by patient's insurance. 100 strip 2   losartan (COZAAR) 25 MG tablet Take 1 tablet (25 mg total) by mouth daily. 90 tablet 3   meclizine (ANTIVERT) 25 MG tablet Take 1 tablet (25 mg total) by mouth 3 (three) times daily as needed for dizziness. 30 tablet 0   metFORMIN (GLUCOPHAGE) 1000 MG tablet Take 1 tablet (1,000 mg total) by mouth 2 (two) times daily with a meal. 180 tablet 3   pantoprazole (PROTONIX) 20 MG tablet Take 20 mg by mouth daily.     raloxifene (EVISTA) 60 MG tablet Take 1 tablet  (60 mg total) by mouth daily. 90 tablet 3   simvastatin (ZOCOR) 20 MG tablet Take 1 tablet (20 mg total) by mouth at bedtime. 90 tablet 1   No current facility-administered medications for this visit.    ALLERGIES: Pantoprazole and Vioxx [rofecoxib]  Family History  Problem Relation Age of Onset   Diabetes Mother    Hypertension Mother    Colon cancer Father    Osteoporosis Sister    Pancreatic cancer Sister 74   Hypertension Daughter    Heart disease Maternal Aunt    Ovarian cancer Maternal Aunt    Colon cancer Paternal Uncle    Breast cancer Neg Hx    BRCA 1/2 Neg Hx     Review of Systems  All other systems reviewed and are negative.   PHYSICAL EXAM:  BP 126/70 (BP Location: Left Arm, Patient Position: Sitting, Cuff Size: Small)   Pulse 87   Ht 5' 0.25" (1.53 m)   Wt 163 lb (73.9 kg)   SpO2 97%   BMI 31.57 kg/m     General appearance: alert, cooperative and appears stated age Head: normocephalic, without obvious abnormality, atraumatic Neck: no adenopathy, supple, symmetrical, trachea midline and thyroid normal to inspection and palpation Lungs: clear to auscultation bilaterally Breasts: normal appearance, no masses or tenderness, No nipple retraction or dimpling, No nipple discharge or bleeding, No axillary adenopathy Heart: regular rate and rhythm Abdomen: soft, non-tender; no masses, no organomegaly Extremities: extremities normal, atraumatic, no cyanosis or edema Skin: skin color, texture, turgor normal. No rashes or lesions Lymph nodes: cervical, supraclavicular, and axillary nodes normal. Neurologic: grossly normal  Pelvic: External genitalia:  no lesions              No abnormal inguinal nodes palpated.              Urethra:  normal appearing urethra with no masses, tenderness or lesions              Bartholins and Skenes: normal                 Vagina: normal appearing vagina with normal color and discharge, no lesions              Cervix: no lesions               Pap taken: yes Bimanual Exam:  Uterus:  normal size, contour, position, consistency, mobility, non-tender              Adnexa: no mass, fullness, tenderness              Rectal exam: yes.  Confirms.              Anus:  normal sphincter tone, no lesions  Chaperone was present for exam:  Warren Lacy, CMA  ASSESSMENT: Encounter for breast and pelvic exam.  Status post BSO.  Brenner tumor.  Osteopenia.  On Evista through PCP.  BMD is almost normal.   PLAN: Mammogram screening discussed. Self breast awareness reviewed. Pap and reflex HR HRV collected:  Yes.   Guidelines for Calcium, Vitamin D, regular exercise program including cardiovascular and weight bearing exercise. Medication refills:  NA We discussed her BMD report and Evista use.  I educated her that Evista reduces risk of breast cancer.  We reviewed possible discontinuation of Evista after reviewing with her PCP.  Next BMD in about 2 years.  Follow up:  2 years and prn.     15 min  total time was spent for this patient encounter, including preparation, face-to-face counseling with the patient, coordination of care, and documentation of the encounter in addition to doing the breast and pelvic exam and pap collection.

## 2023-10-28 DIAGNOSIS — M1711 Unilateral primary osteoarthritis, right knee: Secondary | ICD-10-CM | POA: Diagnosis not present

## 2023-11-03 DIAGNOSIS — M1711 Unilateral primary osteoarthritis, right knee: Secondary | ICD-10-CM | POA: Diagnosis not present

## 2023-11-09 ENCOUNTER — Ambulatory Visit
Admission: RE | Admit: 2023-11-09 | Discharge: 2023-11-09 | Disposition: A | Discharge status: Home / Self Care | Payer: PPO | Source: Ambulatory Visit | Attending: Family Medicine | Admitting: Family Medicine

## 2023-11-09 DIAGNOSIS — Z1231 Encounter for screening mammogram for malignant neoplasm of breast: Secondary | ICD-10-CM | POA: Diagnosis not present

## 2023-11-10 ENCOUNTER — Ambulatory Visit (INDEPENDENT_AMBULATORY_CARE_PROVIDER_SITE_OTHER): Payer: PPO | Admitting: Obstetrics and Gynecology

## 2023-11-10 ENCOUNTER — Encounter: Payer: Self-pay | Admitting: Obstetrics and Gynecology

## 2023-11-10 ENCOUNTER — Other Ambulatory Visit (HOSPITAL_COMMUNITY)
Admission: RE | Admit: 2023-11-10 | Discharge: 2023-11-10 | Disposition: A | Discharge status: Home / Self Care | Payer: PPO | Source: Ambulatory Visit | Attending: Obstetrics and Gynecology | Admitting: Obstetrics and Gynecology

## 2023-11-10 VITALS — BP 126/70 | HR 87 | Ht 60.25 in | Wt 163.0 lb

## 2023-11-10 DIAGNOSIS — Z124 Encounter for screening for malignant neoplasm of cervix: Secondary | ICD-10-CM

## 2023-11-10 DIAGNOSIS — Z01419 Encounter for gynecological examination (general) (routine) without abnormal findings: Secondary | ICD-10-CM | POA: Diagnosis not present

## 2023-11-10 DIAGNOSIS — M8580 Other specified disorders of bone density and structure, unspecified site: Secondary | ICD-10-CM

## 2023-11-11 DIAGNOSIS — M1711 Unilateral primary osteoarthritis, right knee: Secondary | ICD-10-CM | POA: Diagnosis not present

## 2023-11-11 LAB — CYTOLOGY - PAP: Diagnosis: NEGATIVE

## 2024-01-10 ENCOUNTER — Other Ambulatory Visit: Payer: Self-pay | Admitting: Family Medicine

## 2024-01-10 DIAGNOSIS — E119 Type 2 diabetes mellitus without complications: Secondary | ICD-10-CM

## 2024-02-14 ENCOUNTER — Other Ambulatory Visit: Payer: Self-pay | Admitting: Family Medicine

## 2024-02-14 DIAGNOSIS — E785 Hyperlipidemia, unspecified: Secondary | ICD-10-CM

## 2024-02-23 DIAGNOSIS — H43393 Other vitreous opacities, bilateral: Secondary | ICD-10-CM | POA: Diagnosis not present

## 2024-02-23 DIAGNOSIS — Z7984 Long term (current) use of oral hypoglycemic drugs: Secondary | ICD-10-CM | POA: Diagnosis not present

## 2024-02-23 DIAGNOSIS — H2513 Age-related nuclear cataract, bilateral: Secondary | ICD-10-CM | POA: Diagnosis not present

## 2024-02-23 DIAGNOSIS — H524 Presbyopia: Secondary | ICD-10-CM | POA: Diagnosis not present

## 2024-02-23 DIAGNOSIS — E119 Type 2 diabetes mellitus without complications: Secondary | ICD-10-CM | POA: Diagnosis not present

## 2024-02-23 DIAGNOSIS — H43813 Vitreous degeneration, bilateral: Secondary | ICD-10-CM | POA: Diagnosis not present

## 2024-02-23 DIAGNOSIS — H5203 Hypermetropia, bilateral: Secondary | ICD-10-CM | POA: Diagnosis not present

## 2024-02-23 DIAGNOSIS — H04123 Dry eye syndrome of bilateral lacrimal glands: Secondary | ICD-10-CM | POA: Diagnosis not present

## 2024-02-23 DIAGNOSIS — H25013 Cortical age-related cataract, bilateral: Secondary | ICD-10-CM | POA: Diagnosis not present

## 2024-02-23 LAB — HM DIABETES EYE EXAM

## 2024-03-02 DIAGNOSIS — L821 Other seborrheic keratosis: Secondary | ICD-10-CM | POA: Diagnosis not present

## 2024-03-02 DIAGNOSIS — Z85828 Personal history of other malignant neoplasm of skin: Secondary | ICD-10-CM | POA: Diagnosis not present

## 2024-03-02 DIAGNOSIS — L814 Other melanin hyperpigmentation: Secondary | ICD-10-CM | POA: Diagnosis not present

## 2024-03-02 DIAGNOSIS — D225 Melanocytic nevi of trunk: Secondary | ICD-10-CM | POA: Diagnosis not present

## 2024-03-02 DIAGNOSIS — Z08 Encounter for follow-up examination after completed treatment for malignant neoplasm: Secondary | ICD-10-CM | POA: Diagnosis not present

## 2024-03-02 DIAGNOSIS — L720 Epidermal cyst: Secondary | ICD-10-CM | POA: Diagnosis not present

## 2024-03-22 DIAGNOSIS — M25561 Pain in right knee: Secondary | ICD-10-CM | POA: Diagnosis not present

## 2024-03-23 DIAGNOSIS — N3010 Interstitial cystitis (chronic) without hematuria: Secondary | ICD-10-CM | POA: Diagnosis not present

## 2024-03-23 NOTE — Progress Notes (Unsigned)
 [facility] [address] [place], [place] [address] [phone]  Date:  03/27/2024   Name:  Natalie Fowler   DOB:  [birth year]   MRN:  [id number]  PCP:  Kaylee Partridge, MD    Chief Complaint: No chief complaint on file.   History of Present Illness:  Natalie Fowler is a 78 y.o. very pleasant female patient who presents with the following:  Patient seen today for periodic follow-up.  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   Dyslipidemia 05/02/2021   Diet-controlled diabetes mellitus (HCC) 01/20/2021   Low back pain 11/19/2016   History of vitamin D  deficiency 09/30/2016   H/O colonoscopy with polypectomy 09/30/2015   Vaginal atrophy 09/25/2014   Vertigo    Osteopenia    Acid reflux     Past Medical History:  Diagnosis Date   Acid reflux    Arthritis    Marni Sins tumor    benign   Osteopenia    Ovarian cyst, bilateral    Pneumonia    PONV (postoperative nausea and vomiting)    Pre-diabetes    Vertigo    Vitamin D  deficiency     Past Surgical History:  Procedure Laterality Date   APPENDECTOMY     CHOLECYSTECTOMY     CYST EXCISION     CHEST    JOINT REPLACEMENT     OOPHORECTOMY  10/23/2006   DIAG LAP WITH BSO   PELVIC LAPAROSCOPY  10/23/2006   DIAG LAP WITH BSO   ROTATOR CUFF REPAIR     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   Procedure: TOTAL KNEE ARTHROPLASTY;   Surgeon: Claiborne Crew, MD;  Location: [facility];  Service: Orthopedics;  Laterality: Left;    Social History   Tobacco Use   Smoking status: Never   Smokeless tobacco: Never  Vaping Use   Vaping status: Never Used  Substance Use Topics   Alcohol use: No    Alcohol/week: 0.0 standard drinks of alcohol   Drug use: Never    Family History  Problem Relation Age of Onset   Diabetes Mother    Hypertension Mother    Colon cancer Father    Osteoporosis Sister    Pancreatic cancer Sister 1   Hypertension Daughter    Heart disease Maternal Aunt    Ovarian cancer Maternal Aunt    Colon cancer Paternal Uncle    Breast cancer Neg Hx    BRCA 1/2 Neg Hx     Allergies  Allergen Reactions   Pantoprazole  Cough   Vioxx [Rofecoxib] Rash    Medication list has been reviewed and updated.  Current Outpatient Medications on File Prior to Visit  Medication Sig Dispense Refill   albuterol  (VENTOLIN  HFA) 108 (90 Base) MCG/ACT inhaler Inhale 2 puffs into the lungs every 6 (six) hours as needed for wheezing or shortness of breath. 8 g 2   albuterol  (VENTOLIN  HFA) 108 (90 Base) MCG/ACT  inhaler Inhale 2 puffs into the lungs every 6 (six) hours as needed for wheezing or shortness of breath. 1 each 0   aspirin  81 MG chewable tablet Chew by mouth daily.     Blood Glucose Monitoring Suppl DEVI 1 each by Does not apply route daily as needed. May substitute to any manufacturer covered by patient's insurance. 1 each 0   cephALEXin  (KEFLEX ) 500 MG capsule Take 1 capsule (500 mg total) by mouth 2 (two) times daily. 14 capsule 0   cholecalciferol (VITAMIN D ) 1000 units tablet Take 2,000 Units by mouth daily.     famotidine (PEPCID) 40 MG tablet Take by mouth.     Glucose Blood (BLOOD GLUCOSE TEST STRIPS) STRP Use as needed to monitor blood sugar once a day. May substitute to any manufacturer covered by patient's insurance. 100 strip 2   losartan  (COZAAR ) 25 MG tablet Take 1 tablet by mouth once daily 90  tablet 0   meclizine  (ANTIVERT ) 25 MG tablet Take 1 tablet (25 mg total) by mouth 3 (three) times daily as needed for dizziness. 30 tablet 0   metFORMIN  (GLUCOPHAGE ) 1000 MG tablet Take 1 tablet (1,000 mg total) by mouth 2 (two) times daily with a meal. 180 tablet 3   pantoprazole  (PROTONIX ) 20 MG tablet Take 20 mg by mouth daily.     raloxifene  (EVISTA ) 60 MG tablet Take 1 tablet (60 mg total) by mouth daily. 90 tablet 3   simvastatin  (ZOCOR ) 20 MG tablet TAKE 1 TABLET BY MOUTH AT BEDTIME 90 tablet 0   No current facility-administered medications on file prior to visit.    Review of Systems:  As per HPI- otherwise negative.   Physical Examination: There were no vitals filed for this visit. There were no vitals filed for this visit. There is no height or weight on file to calculate BMI. Ideal Body Weight:    GEN: no acute distress. HEENT: Atraumatic, Normocephalic.  Ears and Nose: No external deformity. CV: RRR, No M/G/R. No JVD. No thrill. No extra heart sounds. PULM: CTA B, no wheezes, crackles, rhonchi. No retractions. No resp. distress. No accessory muscle use. ABD: S, NT, ND, +BS. No rebound. No HSM. EXTR: No c/c/e PSYCH: Normally interactive. Conversant.    Assessment and Plan: ***  Signed Gates Kasal, MD

## 2024-03-27 ENCOUNTER — Encounter: Payer: Self-pay | Admitting: Family Medicine

## 2024-03-27 ENCOUNTER — Ambulatory Visit (INDEPENDENT_AMBULATORY_CARE_PROVIDER_SITE_OTHER): Payer: PPO | Admitting: Family Medicine

## 2024-03-27 VITALS — BP 124/72 | HR 85 | Temp 98.2°F | Ht 60.0 in | Wt 163.2 lb

## 2024-03-27 DIAGNOSIS — R9431 Abnormal electrocardiogram [ECG] [EKG]: Secondary | ICD-10-CM | POA: Diagnosis not present

## 2024-03-27 DIAGNOSIS — E785 Hyperlipidemia, unspecified: Secondary | ICD-10-CM | POA: Diagnosis not present

## 2024-03-27 DIAGNOSIS — Z5181 Encounter for therapeutic drug level monitoring: Secondary | ICD-10-CM

## 2024-03-27 DIAGNOSIS — E119 Type 2 diabetes mellitus without complications: Secondary | ICD-10-CM

## 2024-03-27 DIAGNOSIS — M8589 Other specified disorders of bone density and structure, multiple sites: Secondary | ICD-10-CM | POA: Diagnosis not present

## 2024-03-27 DIAGNOSIS — Z1329 Encounter for screening for other suspected endocrine disorder: Secondary | ICD-10-CM

## 2024-03-27 DIAGNOSIS — R079 Chest pain, unspecified: Secondary | ICD-10-CM

## 2024-03-27 LAB — CBC
HCT: 42.4 % (ref 36.0–46.0)
Hemoglobin: 14.1 g/dL (ref 12.0–15.0)
MCHC: 33.2 g/dL (ref 30.0–36.0)
MCV: 89.4 fl (ref 78.0–100.0)
Platelets: 200 10*3/uL (ref 150.0–400.0)
RBC: 4.74 Mil/uL (ref 3.87–5.11)
RDW: 13.6 % (ref 11.5–15.5)
WBC: 8.2 10*3/uL (ref 4.0–10.5)

## 2024-03-27 LAB — COMPREHENSIVE METABOLIC PANEL WITH GFR
ALT: 18 U/L (ref 0–35)
AST: 12 U/L (ref 0–37)
Albumin: 4.4 g/dL (ref 3.5–5.2)
Alkaline Phosphatase: 50 U/L (ref 39–117)
BUN: 19 mg/dL (ref 6–23)
CO2: 29 meq/L (ref 19–32)
Calcium: 9.7 mg/dL (ref 8.4–10.5)
Chloride: 103 meq/L (ref 96–112)
Creatinine, Ser: 0.97 mg/dL (ref 0.40–1.20)
GFR: 56.96 mL/min — ABNORMAL LOW (ref >60.00)
Glucose, Bld: 131 mg/dL — ABNORMAL HIGH (ref 70–99)
Potassium: 4.7 meq/L (ref 3.5–5.1)
Sodium: 141 meq/L (ref 135–145)
Total Bilirubin: 0.5 mg/dL (ref 0.2–1.2)
Total Protein: 6.8 g/dL (ref 6.0–8.3)

## 2024-03-27 LAB — HEMOGLOBIN A1C: Hgb A1c MFr Bld: 7.1 % — ABNORMAL HIGH (ref 4.6–6.5)

## 2024-03-27 LAB — TSH: TSH: 2.69 u[IU]/mL (ref 0.35–5.50)

## 2024-03-27 MED ORDER — ONETOUCH VERIO FLEX SYSTEM W/DEVICE KIT: PACK | 0 refills | Status: AC

## 2024-03-27 MED ORDER — ASSURE COMFORT LANCETS 28G MISC
1 refills | Status: DC
Start: 2024-03-27 — End: 2024-03-27

## 2024-03-27 MED ORDER — ONETOUCH VERIO VI STRP: ORAL_STRIP | 1 refills | Status: AC

## 2024-03-27 MED ORDER — ONETOUCH DELICA PLUS LANCET33G MISC: 1 refills | Status: AC

## 2024-03-27 MED ORDER — ASSURE PRISM MULTI TEST VI STRP
ORAL_STRIP | 1 refills | Status: DC
Start: 2024-03-27 — End: 2024-03-27

## 2024-03-27 NOTE — Addendum Note (Signed)
 Addended by: Marylou Sobers D on: 03/27/2024 05:23 PM   Modules accepted: Orders

## 2024-03-27 NOTE — Patient Instructions (Addendum)
 It was good to see you again today- I will be in touch with your labs asap Please let me know if anything changes with your chest pain We will get an echocardiogram to make sure your heart is working well prior to planned knee surgery  Recommend RSV, tetanus booster and a covid booster at your pharmacy   Assuming all is well please see me in about 6 months

## 2024-03-29 ENCOUNTER — Encounter: Payer: Self-pay | Admitting: Family Medicine

## 2024-04-05 ENCOUNTER — Other Ambulatory Visit: Payer: Self-pay | Admitting: Family Medicine

## 2024-04-05 ENCOUNTER — Encounter: Payer: Self-pay | Admitting: Family Medicine

## 2024-04-05 DIAGNOSIS — E119 Type 2 diabetes mellitus without complications: Secondary | ICD-10-CM

## 2024-04-05 DIAGNOSIS — N281 Cyst of kidney, acquired: Secondary | ICD-10-CM | POA: Diagnosis not present

## 2024-04-12 DIAGNOSIS — M1711 Unilateral primary osteoarthritis, right knee: Secondary | ICD-10-CM | POA: Diagnosis not present

## 2024-04-17 ENCOUNTER — Encounter: Payer: Self-pay | Admitting: Family Medicine

## 2024-04-17 DIAGNOSIS — R9431 Abnormal electrocardiogram [ECG] [EKG]: Secondary | ICD-10-CM

## 2024-04-17 DIAGNOSIS — R079 Chest pain, unspecified: Secondary | ICD-10-CM

## 2024-04-18 NOTE — Addendum Note (Signed)
 Addended by: Gates Kasal C on: 04/18/2024 02:29 PM   Modules accepted: Orders

## 2024-04-25 ENCOUNTER — Telehealth (HOSPITAL_COMMUNITY): Payer: Self-pay | Admitting: Family Medicine

## 2024-04-25 NOTE — Telephone Encounter (Signed)
 We have attempted to contact the ordering providers office to obtain a prior authorization for the ordered test.  However, we have been unsuccesful. Order will be removed from the active order WQ. Once the prior authorization is obtained we will reinstate the order and schedule patient.     04/24/24 Order removed due to no repsonse from MD office for PA#  04/18/24 Inbasket x 3 sent for PA# 04/03/24 Inbasket sent for PA#  03/27/24 INbakset sent for PA#      Thank you         Thank you

## 2024-04-26 IMAGING — CT CT CHEST HIGH RESOLUTION
2 of 5 series · 15 of 36 positions shown, 18 images · non-contrast
Comparison: 10/10/2009

CLINICAL DATA: Wheezing, concern for tracheobronchomalacia



[Series 2: thorax · axial · 0.77mm/px · z∈[-65,+189]mm · 12 of 141 slices shown, 15 images]
[im 7/141  mediastinal]
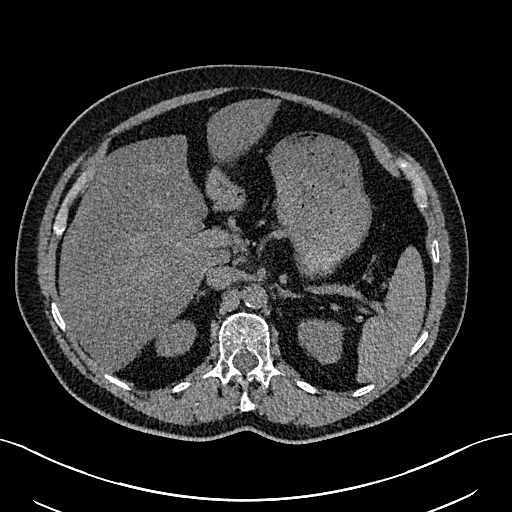
[im 7/141  lung]
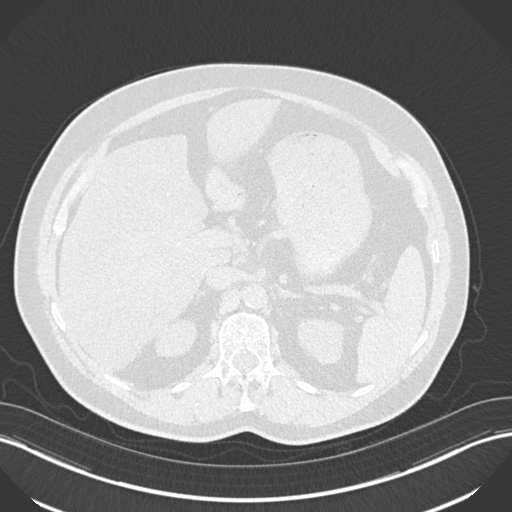
[im 19/141  lung]
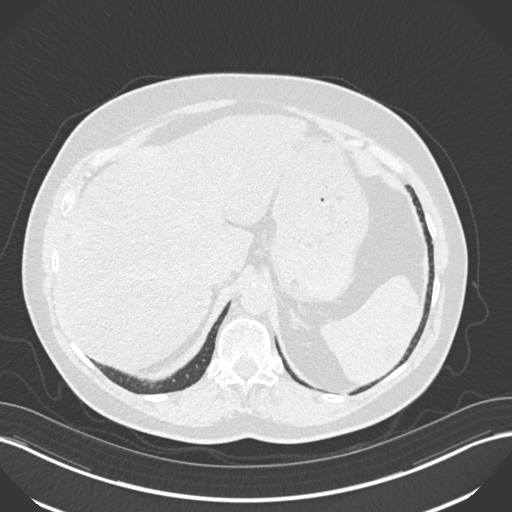
[im 31/141  lung]
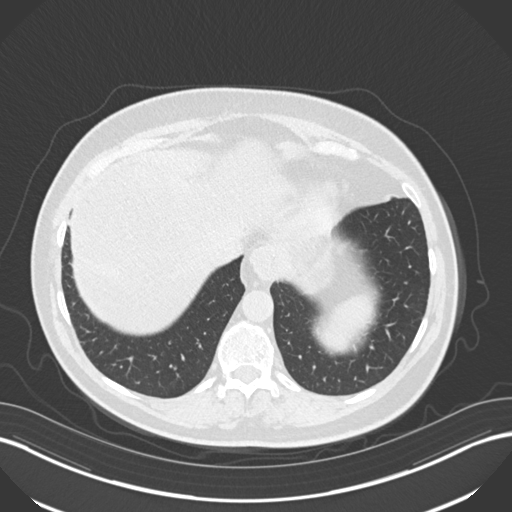
[im 43/141  lung]
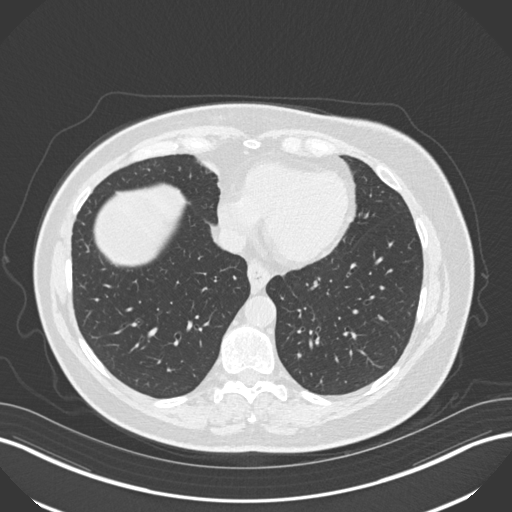
[im 55/141  mediastinal]
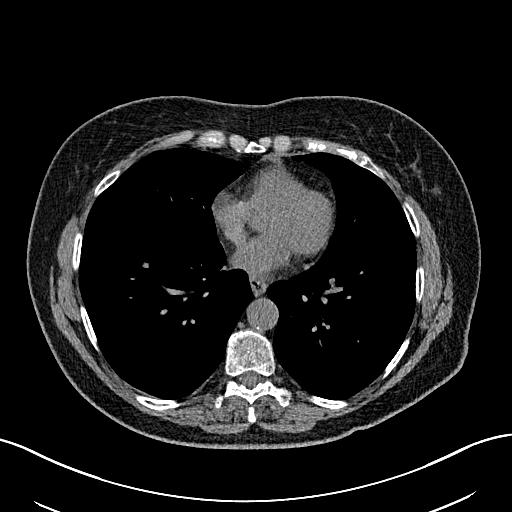
[im 55/141  lung]
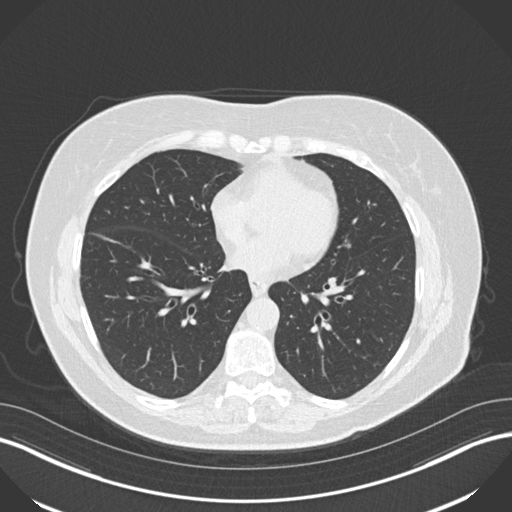
[im 67/141  lung]
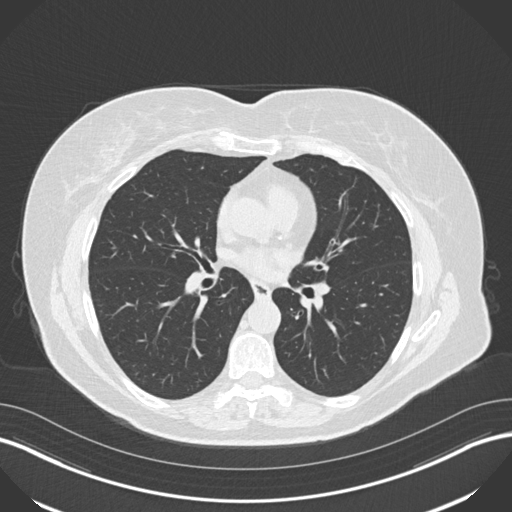
[im 74/141  lung]
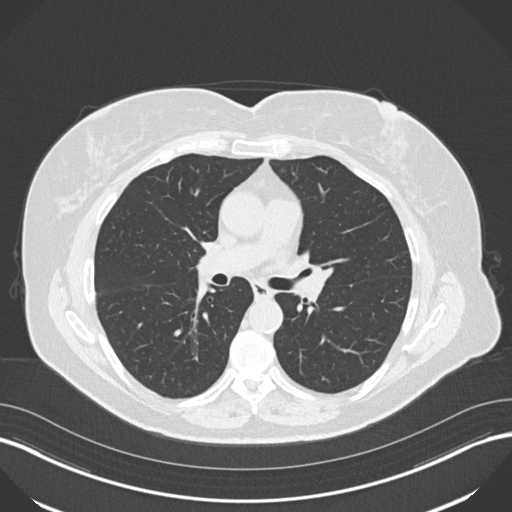
[im 86/141  lung]
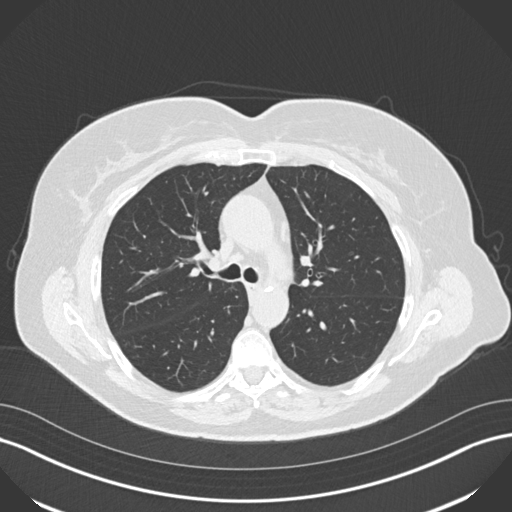
[im 98/141  mediastinal]
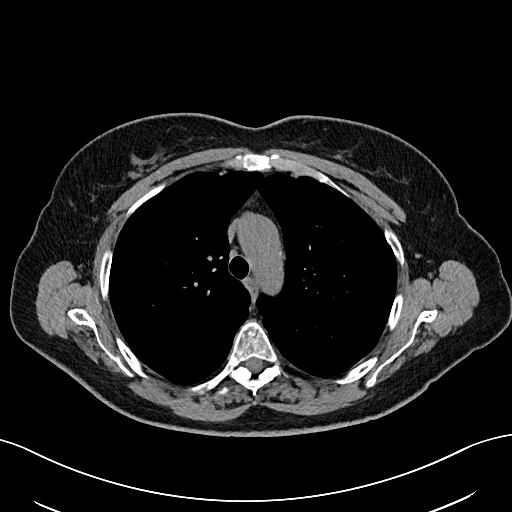
[im 98/141  lung]
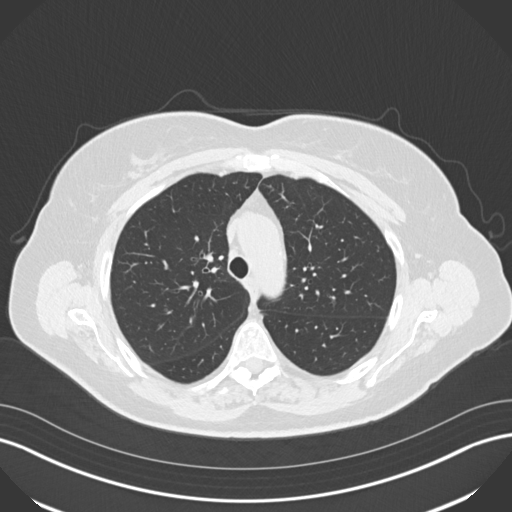
[im 110/141  lung]
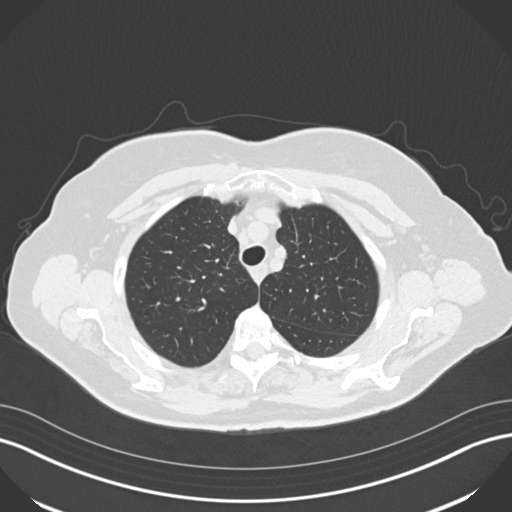
[im 122/141  lung]
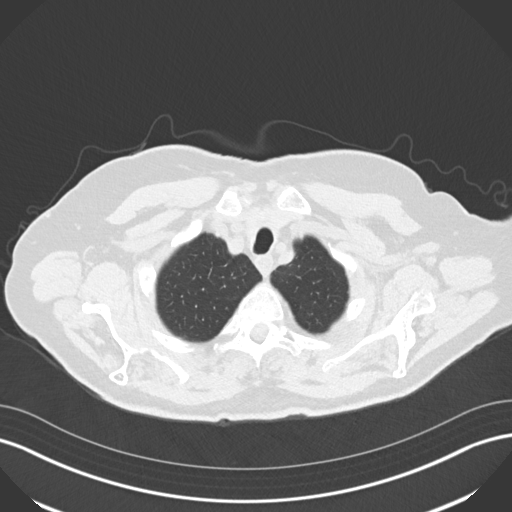
[im 134/141  lung]
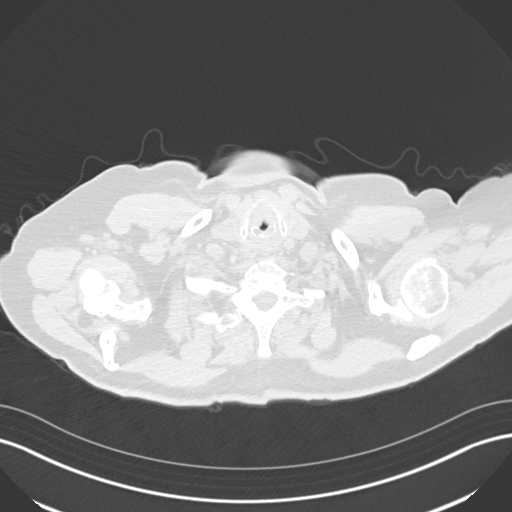

[Series 6: coronal · coronal · 0.58mm/px · 3 of 139 slices shown]
[im 28/139  lung]
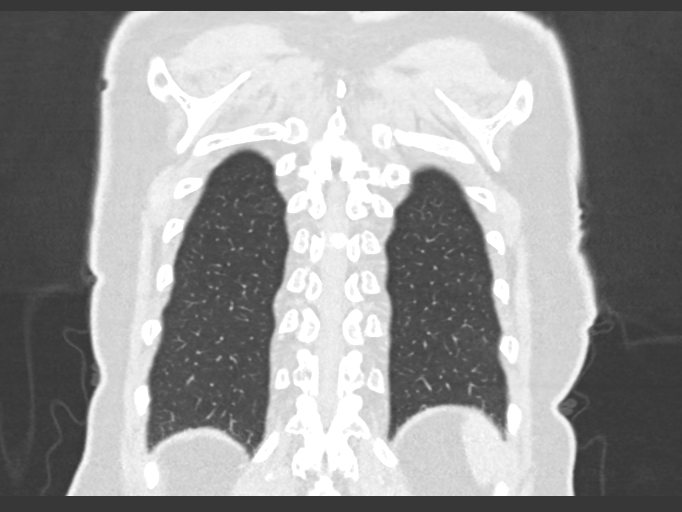
[im 56/139  lung]
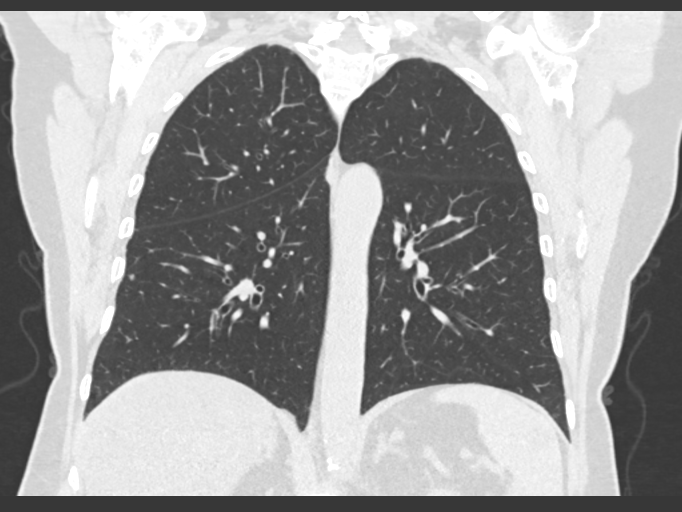
[im 83/139  lung]
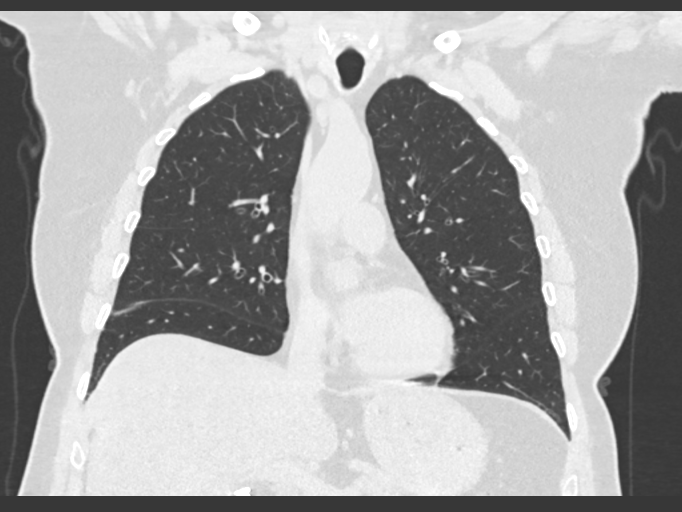

[15 of 36 positions shown; findings below may reference images not displayed]

FINDINGS: Cardiovascular: Aortic atherosclerosis. Normal heart size. Left
coronary artery calcifications. No pericardial effusion.

Mediastinum/Nodes: No enlarged mediastinal, hilar, or axillary lymph
nodes. Small hiatal hernia. Thyroid gland, trachea, and esophagus
demonstrate no significant findings.

Lungs/Pleura: Mild, diffuse bilateral bronchial wall thickening and
scattered bronchiolar plugging. Stable, definitively benign small
nodules of the peripheral right upper lobe measuring 0.5 cm and
smaller, for which no further follow-up or characterization is
required (series 5, image 90). No significant air trapping on
expiratory phase imaging. No significant tracheobronchomalacia. No
pleural effusion or pneumothorax.

Upper Abdomen: No acute abnormality.  Hepatic steatosis.

Musculoskeletal: No chest wall abnormality. No suspicious osseous
lesions identified.
IMPRESSION: 1. No evidence of fibrotic interstitial lung disease.
2. Mild, diffuse bilateral bronchial wall thickening and scattered
bronchiolar plugging, consistent with nonspecific infectious or
inflammatory bronchitis.
3. No significant air trapping on expiratory phase imaging. No
significant tracheobronchomalacia.
4. Coronary artery disease.
5. Small hiatal hernia.
6. Hepatic steatosis.

Aortic Atherosclerosis ([id number]-PSE.E).

## 2024-05-18 ENCOUNTER — Encounter: Payer: Self-pay | Admitting: Cardiovascular Disease

## 2024-05-18 ENCOUNTER — Ambulatory Visit: Attending: Cardiovascular Disease | Admitting: Cardiovascular Disease

## 2024-05-18 VITALS — BP 150/82 | HR 95 | Ht 60.0 in | Wt 165.2 lb

## 2024-05-18 DIAGNOSIS — R9431 Abnormal electrocardiogram [ECG] [EKG]: Secondary | ICD-10-CM

## 2024-05-18 DIAGNOSIS — Z01810 Encounter for preprocedural cardiovascular examination: Secondary | ICD-10-CM | POA: Diagnosis not present

## 2024-05-18 DIAGNOSIS — R079 Chest pain, unspecified: Secondary | ICD-10-CM | POA: Diagnosis not present

## 2024-05-18 NOTE — Addendum Note (Signed)
 Addended by: DARRON GRASS A on: 05/18/2024 02:37 PM   Modules accepted: Orders

## 2024-05-18 NOTE — Progress Notes (Signed)
 Cardiology Office Note   Date:  05/18/2024   ID:  Natalie Fowler, DOB 07/21/1946, MRN [id number]  PCP:  Watt Harlene BROCKS, MD  Cardiologist:   Deatrice Cage, MD   Chief Complaint  Patient presents with   New Patient (Initial Visit)    Pt c/o pain on the left side breast area .      History of Present Illness: Natalie Fowler is a 78 y.o. female who was referred by Dr. Watt for evaluation of chest pain and preop cardiovascular evaluation before knee surgery.  She has no prior cardiac history.  She has known history of type 2 diabetes, essential hypertension, hyperlipidemia and obesity.  She is not a smoker.  She reports family history of coronary artery disease as her aunt died at the age of 38 of myocardial infarction. She reports intermittent episodes of left-sided chest pain described as aching sensation lasting few minutes which happens mostly at rest and not with exertion.  This is not related to food.  She does have known history of GERD and takes Protonix  but the pain on the left side is different from her usual reflux discomfort. She does not exercise on a regular basis and her physical activities are limited by significant knee arthritis.  She is scheduled for right knee replacement next month.    Past Medical History:  Diagnosis Date   Acid reflux    Arthritis    Harrold tumor    benign   Osteopenia    Ovarian cyst, bilateral    Pneumonia    PONV (postoperative nausea and vomiting)    Pre-diabetes    Vertigo    Vitamin D  deficiency     Past Surgical History:  Procedure Laterality Date   APPENDECTOMY     CHOLECYSTECTOMY     CYST EXCISION     CHEST    JOINT REPLACEMENT     OOPHORECTOMY  10/23/2006   DIAG LAP WITH BSO   PELVIC LAPAROSCOPY  10/23/2006   DIAG LAP WITH BSO   ROTATOR CUFF REPAIR     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   Procedure: TOTAL KNEE ARTHROPLASTY;  Surgeon: Ernie Cough, MD;  Location: [facility];  Service: Orthopedics;   Laterality: Left;     Current Outpatient Medications  Medication Sig Dispense Refill   Blood Glucose Monitoring Suppl (ONETOUCH VERIO FLEX SYSTEM) w/Device KIT Use to check blood pressure once a day.  Dx code: E11.9 1 kit 0   cephALEXin  (KEFLEX ) 500 MG capsule Take 1 capsule (500 mg total) by mouth 2 (two) times daily. 14 capsule 0   cholecalciferol (VITAMIN D ) 1000 units tablet Take 2,000 Units by mouth daily.     famotidine (PEPCID) 40 MG tablet Take by mouth.     glucose blood (ONETOUCH VERIO) test strip Use to check sugar once a day.  Dx code: E11.9 100 each 1   Lancets (ONETOUCH DELICA PLUS LANCET33G) MISC Use to check sugar once a day.  Dx Code: E11.9 100 each 1   losartan  (COZAAR ) 25 MG tablet Take 1 tablet (25 mg total) by mouth daily. 90 tablet 1   metFORMIN  (GLUCOPHAGE ) 1000 MG tablet Take 1 tablet (1,000 mg total) by mouth 2 (two) times daily with a meal. 180 tablet 3   pantoprazole  (PROTONIX ) 20 MG tablet Take 20 mg by mouth daily.     raloxifene  (EVISTA ) 60 MG tablet Take 1 tablet (60 mg total) by mouth daily. 90 tablet 3   simvastatin  (ZOCOR ) 20  MG tablet TAKE 1 TABLET BY MOUTH AT BEDTIME 90 tablet 0   meclizine  (ANTIVERT ) 25 MG tablet Take 1 tablet (25 mg total) by mouth 3 (three) times daily as needed for dizziness. (Patient not taking: Reported on 05/18/2024) 30 tablet 0   No current facility-administered medications for this visit.    Allergies:   Pantoprazole  and Vioxx [rofecoxib]    Social History:  The patient  reports that she has never smoked. She has never used smokeless tobacco. She reports that she does not drink alcohol and does not use drugs.   Family History:  The patient's family history includes Colon cancer in her father and paternal uncle; Diabetes in her mother; Heart attack in her maternal uncle; Heart disease in her father and maternal aunt; Heart failure in her father; Hypertension in her daughter and mother; Osteoporosis in her sister; Ovarian cancer in  her maternal aunt; Pancreatic cancer (age of onset: 79) in her sister.    ROS:  Please see the history of present illness.   Otherwise, review of systems are positive for none.   All other systems are reviewed and negative.    PHYSICAL EXAM: VS:  BP (!) 150/82 (BP Location: Right Arm, Patient Position: Sitting, Cuff Size: Normal)   Pulse 95   Ht 5' (1.524 m)   Wt 165 lb 3.2 oz (74.9 kg)   SpO2 98%   BMI 32.26 kg/m  , BMI Body mass index is 32.26 kg/m. GEN: Well nourished, well developed, in no acute distress  HEENT: normal  Neck: no JVD, carotid bruits, or masses Cardiac: RRR; no murmurs, rubs, or gallops,no edema  Respiratory:  clear to auscultation bilaterally, normal work of breathing GI: soft, nontender, nondistended, + BS MS: no deformity or atrophy  Skin: warm and dry, no rash Neuro:  Strength and sensation are intact Psych: euthymic mood, full affect   EKG:  EKG is ordered today. The ekg ordered today demonstrates : Normal sinus rhythm Cannot rule out Anterior infarct , age undetermined       Recent Labs: 03/27/2024: ALT 18; BUN 19; Creatinine, Ser 0.97; Hemoglobin 14.1; Platelets 200.0; Potassium 4.7; Sodium 141; TSH 2.69    Lipid Panel    Component Value Date/Time   CHOL 143 09/27/2023 0842   TRIG 132.0 09/27/2023 0842   HDL 50.00 09/27/2023 0842   CHOLHDL 3 09/27/2023 0842   VLDL 26.4 09/27/2023 0842   LDLCALC 66 09/27/2023 0842   LDLCALC 120 (H) 10/30/2019 1506   LDLDIRECT 156.0 07/08/2022 1207      Wt Readings from Last 3 Encounters:  05/18/24 165 lb 3.2 oz (74.9 kg)  03/27/24 163 lb 3.2 oz (74 kg)  11/10/23 163 lb (73.9 kg)          05/18/2024   10:07 AM  PAD Screen  Previous PAD dx? No  Previous surgical procedure? No  Pain with walking? No  Feet/toe relief with dangling? No  Painful, non-healing ulcers? No  Extremities discolored? No      ASSESSMENT AND PLAN:  1.  Atypical chest pain: Multiple risk factors for coronary artery  disease.  EKG shows poor R wave progression in the precordial leads but no significant ST changes or evidence of prior infarct.  I recommend evaluation with a Lexiscan  Myoview .  She is not able to exercise on a treadmill due to knee arthritis.  2.  Preoperative cardiovascular evaluation for knee replacement.  Given her atypical chest pain, abnormal EKG and poor functional capacity, will obtain  ischemic cardiac workup as outlined.  3.  Hyperlipidemia, well-controlled with simvastatin  with most recent LDL of 66.  Informed Consent   Shared Decision Making/Informed Consent The risks [chest pain, shortness of breath, cardiac arrhythmias, dizziness, blood pressure fluctuations, myocardial infarction, stroke/transient ischemic attack, nausea, vomiting, allergic reaction, radiation exposure, metallic taste sensation and life-threatening complications (estimated to be 1 in 10,000)], benefits (risk stratification, diagnosing coronary artery disease, treatment guidance) and alternatives of a nuclear stress test were discussed in detail with Natalie Fowler and she agrees to proceed.       Disposition:   FU with me as needed if cardiac testing is abnormal  Signed,  Deatrice Cage, MD  05/18/2024 10:32 AM    [facility]

## 2024-05-18 NOTE — Patient Instructions (Addendum)
 Medication Instructions:  No changes *If you need a refill on your cardiac medications before your next appointment, please call your pharmacy*  Lab Work: None ordered If you have labs (blood work) drawn today and your tests are completely normal, you will receive your results only by: MyChart Message (if you have MyChart) OR A paper copy in the mail If you have any lab test that is abnormal or we need to change your treatment, we will call you to review the results.  Testing/Procedures: Your physician has requested that you have a lexiscan  myoview .  Please follow instruction sheet, as given.   How to prepare for your Myocardial Perfusion Test: Do not eat or drink 3 hours prior to your test, except you may have water . Do not consume products containing caffeine (regular or decaffeinated) 12 hours prior to your test. (ex: coffee, chocolate, sodas, tea). Do bring a list of your current medications with you.  If not listed below, you may take your medications as normal. Do wear comfortable clothes (no dresses or overalls) and walking shoes, tennis shoes preferred (No heels or open toe shoes are allowed). Do NOT wear cologne, perfume, aftershave, or lotions (deodorant is allowed). The test will take approximately 3 to 4 hours to complete If these instructions are not followed, your test will have to be rescheduled.  This will [facility] done at [facility],  [address] 4th floor  Follow-Up: At [facility], you and your health needs are our priority.  As part of our continuing mission to provide you with exceptional heart care, our providers are all part of one team.  This team includes your primary Cardiologist (physician) and Advanced Practice Providers or APPs (Physician Assistants and Nurse Practitioners) who all work together to provide you with the care you need, when you need it.  Your next appointment:   Follow up as needed

## 2024-05-23 ENCOUNTER — Telehealth (HOSPITAL_COMMUNITY): Payer: Self-pay | Admitting: *Deleted

## 2024-05-23 ENCOUNTER — Other Ambulatory Visit: Payer: Self-pay | Admitting: Family Medicine

## 2024-05-23 DIAGNOSIS — E785 Hyperlipidemia, unspecified: Secondary | ICD-10-CM

## 2024-05-23 NOTE — Telephone Encounter (Signed)
 Left message on voicemail per DPR in reference to upcoming appointment scheduled on  05/23/24 with detailed instructions given per Myocardial Perfusion Study Information Sheet for the test. LM to arrive 15 minutes early, and that it is imperative to arrive on time for appointment to keep from having the test rescheduled. If you need to cancel or reschedule your appointment, please call the office within 24 hours of your appointment. Failure to do so may result in a cancellation of your appointment, and a $50 no show fee. Phone number given for call back for any questions. Claudene Ronal Quale, RN

## 2024-05-24 ENCOUNTER — Other Ambulatory Visit: Payer: Self-pay | Admitting: Cardiovascular Disease

## 2024-05-24 DIAGNOSIS — R079 Chest pain, unspecified: Secondary | ICD-10-CM

## 2024-05-25 ENCOUNTER — Ambulatory Visit (HOSPITAL_COMMUNITY)
Admission: RE | Admit: 2024-05-25 | Discharge: 2024-05-25 | Disposition: A | Discharge status: Home / Self Care | Source: Ambulatory Visit | Attending: Cardiology | Admitting: Cardiology

## 2024-05-25 DIAGNOSIS — R079 Chest pain, unspecified: Secondary | ICD-10-CM | POA: Diagnosis not present

## 2024-05-25 LAB — MYOCARDIAL PERFUSION IMAGING
LV dias vol: 52 mL (ref 46–106)
LV sys vol: 13 mL (ref 3.8–5.2)
Nuc Stress EF: 75 %
Peak HR: 116 {beats}/min
Rest HR: 85 {beats}/min
Rest Nuclear Isotope Dose: 10.2 mCi
SDS: 0
SRS: 0
SSS: 0
ST Depression (mm): 0 mm
Stress Nuclear Isotope Dose: 31.5 mCi
TID: 1

## 2024-05-25 MED ORDER — REGADENOSON 0.4 MG/5ML IV SOLN
0.4000 mg | Freq: Once | INTRAVENOUS | Status: AC
  Administered 2024-05-25: 0.4 mg via INTRAVENOUS

## 2024-05-25 MED ORDER — TECHNETIUM TC 99M TETROFOSMIN IV KIT
10.2000 | PACK | Freq: Once | INTRAVENOUS | Status: AC | PRN
  Administered 2024-05-25: 10.2 via INTRAVENOUS

## 2024-05-25 MED ORDER — TECHNETIUM TC 99M TETROFOSMIN IV KIT
31.5000 | PACK | Freq: Once | INTRAVENOUS | Status: AC | PRN
  Administered 2024-05-25: 31.5 via INTRAVENOUS

## 2024-05-25 MED ORDER — REGADENOSON 0.4 MG/5ML IV SOLN
INTRAVENOUS | Status: AC
  Filled 2024-05-25: qty 5

## 2024-05-31 ENCOUNTER — Ambulatory Visit: Admitting: Cardiovascular Disease

## 2024-05-31 ENCOUNTER — Ambulatory Visit: Payer: Self-pay | Admitting: Cardiovascular Disease

## 2024-06-05 ENCOUNTER — Telehealth: Payer: Self-pay

## 2024-06-05 DIAGNOSIS — M1711 Unilateral primary osteoarthritis, right knee: Secondary | ICD-10-CM | POA: Diagnosis not present

## 2024-06-05 DIAGNOSIS — M25561 Pain in right knee: Secondary | ICD-10-CM | POA: Diagnosis not present

## 2024-06-05 DIAGNOSIS — G8929 Other chronic pain: Secondary | ICD-10-CM | POA: Diagnosis not present

## 2024-06-05 DIAGNOSIS — M25661 Stiffness of right knee, not elsewhere classified: Secondary | ICD-10-CM | POA: Diagnosis not present

## 2024-06-05 NOTE — Telephone Encounter (Signed)
   Patient Name: Natalie Fowler  DOB: 10/28/1946 MRN: [id number]  Primary Cardiologist: None  Chart reviewed as part of pre-operative protocol coverage. Patient was seen in clinic on 05/18/24 by Dr. Darron for preoperative cardiac evaluation. She underwent nuclear stress test on 05/25/24 which was low risk. Per Dr. Darron, She is at low risk for knee surgery.   Given past medical history and time since last visit, based on ACC/AHA guidelines, Tyjanae Conery is at acceptable risk for the planned procedure without further cardiovascular testing.   I will route this recommendation to the requesting party via Epic fax function and remove from pre-op pool.  Please call with questions.  Brysun Eschmann D Joey Lierman, NP 06/05/2024, 5:00 PM

## 2024-06-05 NOTE — Telephone Encounter (Signed)
   Pre-operative Risk Assessment    Patient Name: Natalie Fowler  DOB: [birth year] MRN: [id number]   Date of last office visit: 05/18/24 DR ARIDA Date of next office visit: NONE   Request for Surgical Clearance    Procedure:  RIGHT TOTAL KNEE ARTHROPLASTY  Date of Surgery:  Clearance 06/20/24                                Surgeon:  DR DONNICE CAR Surgeon's Group or Practice Name:  [facility] Phone number:  [phone] Fax number:  [phone]   Type of Clearance Requested:   - Medical    Type of Anesthesia:  Spinal   Additional requests/questions:    Signed, Jimel Myler   06/05/2024, 4:04 PM

## 2024-06-06 NOTE — Patient Instructions (Addendum)
 SURGICAL WAITING ROOM VISITATION  Patients having surgery or a procedure may have no more than 2 support people in the waiting area - these visitors may rotate.    Children under the age of 63 must have an adult with them who is not the patient.  Visitors with respiratory illnesses are discouraged from visiting and should remain at home.  If the patient needs to stay at the hospital during part of their recovery, the visitor guidelines for inpatient rooms apply. Pre-op nurse will coordinate an appropriate time for 1 support person to accompany patient in pre-op.  This support person may not rotate.    Please refer to the [facility] website for the visitor guidelines for Inpatients (after your surgery is over and you are in a regular room).       Your procedure is scheduled on: 06/20/24   Report to [facility] Main Entrance    Report to admitting at 12:20 PM   Call this number if you have problems the morning of surgery [phone]   Do not eat food :After Midnight.   After Midnight you may have the following liquids until  11:50 AM DAY OF SURGERY  Water  Non-Citrus Juices (without pulp, NO RED-Apple, White grape, White cranberry) Black Coffee (NO MILK/CREAM OR CREAMERS, sugar ok)  Clear Tea (NO MILK/CREAM OR CREAMERS, sugar ok) regular and decaf                             Plain Jell-O (NO RED)                                           Fruit ices (not with fruit pulp, NO RED)                                     Popsicles (NO RED)                                                               Sports drinks like Gatorade (NO RED)                  The day of surgery:  Drink ONE (1) Pre-Surgery G2 at 11:50 AM the morning of surgery. Drink in one sitting. Do not sip.  This drink was given to you during your hospital  pre-op appointment visit. Nothing else to drink after completing the  Pre-Surgery G2.    Oral Hygiene is also important to reduce your risk of infection.                                     Remember - BRUSH YOUR TEETH THE MORNING OF SURGERY WITH YOUR REGULAR TOOTHPASTE  DENTURES WILL BE REMOVED PRIOR TO SURGERY PLEASE DO NOT APPLY Poly grip OR ADHESIVES!!!   Stop all vitamins and herbal supplements 7 days before surgery.   Take these medicines the morning of surgery with A SIP OF WATER :  None  How to Manage  Your Diabetes Before and After Surgery  Why is it important to control my blood sugar before and after surgery? Improving blood sugar levels before and after surgery helps healing and can limit problems. A way of improving blood sugar control is eating a healthy diet by:  Eating less sugar and carbohydrates  Increasing activity/exercise  Talking with your doctor about reaching your blood sugar goals High blood sugars (greater than 180 mg/dL) can raise your risk of infections and slow your recovery, so you will need to focus on controlling your diabetes during the weeks before surgery. Make sure that the doctor who takes care of your diabetes knows about your planned surgery including the date and location.  How do I manage my blood sugar before surgery? Check your blood sugar at least 4 times a day, starting 2 days before surgery, to make sure that the level is not too high or low. Check your blood sugar the morning of your surgery when you wake up and every 2 hours until you get to the Short Stay unit. If your blood sugar is less than 70 mg/dL, you will need to treat for low blood sugar: Do not take insulin. Treat a low blood sugar (less than 70 mg/dL) with  cup of clear juice (cranberry or apple), 4 glucose tablets, OR glucose gel. Recheck blood sugar in 15 minutes after treatment (to make sure it is greater than 70 mg/dL). If your blood sugar is not greater than 70 mg/dL on recheck, call [phone] for further instructions. Report your blood sugar to the short stay nurse when you get to Short Stay.  If you are admitted to the  hospital after surgery: Your blood sugar will be checked by the staff and you will probably be given insulin after surgery (instead of oral diabetes medicines) to make sure you have good blood sugar levels. The goal for blood sugar control after surgery is 80-180 mg/dL.   WHAT DO I DO ABOUT MY DIABETES MEDICATION?  Do not take oral diabetes medicines (pills) the morning of surgery. (Do not take Metformin  morning of surgery)  Reviewed and Endorsed by [facility] Patient Education Committee, August 2015             You may not have any metal on your body including hair pins, jewelry, and body piercing             Do not wear make-up, lotions, powders, perfumes/cologne, or deodorant  Do not wear nail polish including gel and S&S, artificial/acrylic nails, or any other type of covering on natural nails including finger and toenails. If you have artificial nails, gel coating, etc. that needs to be removed by a nail salon please have this removed prior to surgery or surgery may need to be canceled/ delayed if the surgeon/ anesthesia feels like they are unable to be safely monitored.   Do not shave  48 hours prior to surgery.    Do not bring valuables to the hospital. [facility] IS NOT RESPONSIBLE   FOR VALUABLES.   Contacts, glasses, dentures or bridgework may not be worn into surgery.   Bring small overnight bag day of surgery.   DO NOT BRING YOUR HOME MEDICATIONS TO THE HOSPITAL. PHARMACY WILL DISPENSE MEDICATIONS LISTED ON YOUR MEDICATION LIST TO YOU DURING YOUR ADMISSION IN THE HOSPITAL!    Special Instructions: Bring a copy of your healthcare power of attorney and living will documents the day of surgery if you haven't scanned them before.  Please read over the following fact sheets you were given: IF YOU HAVE QUESTIONS ABOUT YOUR PRE-OP INSTRUCTIONS PLEASE CALL [phone] Gwen   If you received a COVID test during your pre-op visit  it is requested that you wear a  mask when out in public, stay away from anyone that may not be feeling well and notify your surgeon if you develop symptoms. If you test positive for Covid or have been in contact with anyone that has tested positive in the last 10 days please notify you surgeon.      Pre-operative 5 CHG Bath Instructions   You can play a key role in reducing the risk of infection after surgery. Your skin needs to be as free of germs as possible. You can reduce the number of germs on your skin by washing with CHG (chlorhexidine  gluconate) soap before surgery. CHG is an antiseptic soap that kills germs and continues to kill germs even after washing.   DO NOT use if you have an allergy to chlorhexidine /CHG or antibacterial soaps. If your skin becomes reddened or irritated, stop using the CHG and notify one of our RNs at [phone].   Please shower with the CHG soap starting 4 days before surgery using the following schedule:     Please keep in mind the following:  DO NOT shave, including legs and underarms, starting the day of your first shower.   You may shave your face at any point before/day of surgery.  Place clean sheets on your bed the day you start using CHG soap. Use a clean washcloth (not used since being washed) for each shower. DO NOT sleep with pets once you start using the CHG.   CHG Shower Instructions:  If you choose to wash your hair and private area, wash first with your normal shampoo/soap.  After you use shampoo/soap, rinse your hair and body thoroughly to remove shampoo/soap residue.  Turn the water  OFF and apply about 3 tablespoons (45 ml) of CHG soap to a CLEAN washcloth.  Apply CHG soap ONLY FROM YOUR NECK DOWN TO YOUR TOES (washing for 3-5 minutes)  DO NOT use CHG soap on face, private areas, open wounds, or sores.  Pay special attention to the area where your surgery is being performed.  If you are having back surgery, having someone wash your back for you may be helpful. Wait 2  minutes after CHG soap is applied, then you may rinse off the CHG soap.  Pat dry with a clean towel  Put on clean clothes/pajamas   If you choose to wear lotion, please use ONLY the CHG-compatible lotions on the back of this paper.     Additional instructions for the day of surgery: DO NOT APPLY any lotions, deodorants, cologne, or perfumes.   Put on clean/comfortable clothes.  Brush your teeth.  Ask your nurse before applying any prescription medications to the skin.      CHG Compatible Lotions   Aveeno Moisturizing lotion  Cetaphil Moisturizing Cream  Cetaphil Moisturizing Lotion  Clairol Herbal Essence Moisturizing Lotion, Dry Skin  Clairol Herbal Essence Moisturizing Lotion, Extra Dry Skin  Clairol Herbal Essence Moisturizing Lotion, Normal Skin  Curel Age Defying Therapeutic Moisturizing Lotion with Alpha Hydroxy  Curel Extreme Care Body Lotion  Curel Soothing Hands Moisturizing Hand Lotion  Curel Therapeutic Moisturizing Cream, Fragrance-Free  Curel Therapeutic Moisturizing Lotion, Fragrance-Free  Curel Therapeutic Moisturizing Lotion, Original Formula  Eucerin Daily Replenishing Lotion  Eucerin Dry Skin Therapy Plus Alpha Hydroxy Crme  Eucerin Dry Skin Therapy Plus Alpha Hydroxy Lotion  Eucerin Original Crme  Eucerin Original Lotion  Eucerin Plus Crme Eucerin Plus Lotion  Eucerin TriLipid Replenishing Lotion  Keri Anti-Bacterial Hand Lotion  Keri Deep Conditioning Original Lotion Dry Skin Formula Softly Scented  Keri Deep Conditioning Original Lotion, Fragrance Free Sensitive Skin Formula  Keri Lotion Fast Absorbing Fragrance Free Sensitive Skin Formula  Keri Lotion Fast Absorbing Softly Scented Dry Skin Formula  Keri Original Lotion  Keri Skin Renewal Lotion Keri Silky Smooth Lotion  Keri Silky Smooth Sensitive Skin Lotion  Nivea Body Creamy Conditioning Oil  Nivea Body Extra Enriched Lotion  Nivea Body Original Lotion  Nivea Body Sheer Moisturizing Lotion  Nivea Crme  Nivea Skin Firming Lotion  NutraDerm 30 Skin Lotion  NutraDerm Skin Lotion  NutraDerm Therapeutic Skin Cream  NutraDerm Therapeutic Skin Lotion  ProShield Protective Hand Cream   Incentive Spirometer  An incentive spirometer is a tool that can help keep your lungs clear and active. This tool measures how well you are filling your lungs with each breath. Taking long deep breaths may help reverse or decrease the chance of developing breathing (pulmonary) problems (especially infection) following: A long period of time when you are unable to move or be active. BEFORE THE PROCEDURE  If the spirometer includes an indicator to show your best effort, your nurse or respiratory therapist will set it to a desired goal. If possible, sit up straight or lean slightly forward. Try not to slouch. Hold the incentive spirometer in an upright position. INSTRUCTIONS FOR USE  Sit on the edge of your bed if possible, or sit up as far as you can in bed or on a chair. Hold the incentive spirometer in an upright position. Breathe out normally. Place the mouthpiece in your mouth and seal your lips tightly around it. Breathe in slowly and as deeply as possible, raising the piston or the ball toward the top of the column. Hold your breath for 3-5 seconds or for as long as possible. Allow the piston or ball to fall to the bottom of the column. Remove the mouthpiece from your mouth and breathe out normally. Rest for a few seconds and repeat Steps 1 through 7 at least 10 times every 1-2 hours when you are awake. Take your time and take a few normal breaths between deep breaths. The spirometer may include an indicator to show your best effort. Use the indicator as a goal to work toward during each repetition. After each set of 10 deep breaths, practice coughing to be sure your lungs are clear. If you have an incision (the cut made at the time of surgery), support your incision when coughing by placing a  pillow or rolled up towels firmly against it. Once you are able to get out of bed, walk around indoors and cough well. You may stop using the incentive spirometer when instructed by your caregiver.  RISKS AND COMPLICATIONS Take your time so you do not get dizzy or light-headed. If you are in pain, you may need to take or ask for pain medication before doing incentive spirometry. It is harder to take a deep breath if you are having pain. AFTER USE Rest and breathe slowly and easily. It can be helpful to keep track of a log of your progress. Your caregiver can provide you with a simple table to help with this. If you are using the spirometer at home, follow these instructions: SEEK MEDICAL CARE IF:  You are having  difficultly using the spirometer. You have trouble using the spirometer as often as instructed. Your pain medication is not giving enough relief while using the spirometer. You develop fever of 100.5 F (38.1 C) or higher. SEEK IMMEDIATE MEDICAL CARE IF:  You cough up bloody sputum that had not been present before. You develop fever of 102 F (38.9 C) or greater. You develop worsening pain at or near the incision site. MAKE SURE YOU:  Understand these instructions. Will watch your condition. Will get help right away if you are not doing well or get worse. Document Released: 03/22/2007 Document Revised: 02/01/2012 Document Reviewed: 05/23/2007

## 2024-06-06 NOTE — Progress Notes (Addendum)
 COVID Vaccine received:  []  No []  Yes Date of any COVID positive Test in last 90 days:  PCP - Dr. Harlene Copland Cardiologist -   Chest x-ray -  EKG -  05/18/24 Epic Stress Test - 05/25/24 Epic ECHO - 03/27/24 Epic Cardiac Cath -   Cardiac clearance-06/05/24-Katlyn [facility] NP  Bowel Prep - []  No  []   Yes ______  Pacemaker / ICD device []  No []  Yes   Spinal Cord Stimulator:[]  No []  Yes       History of Sleep Apnea? []  No []  Yes   CPAP used?- []  No []  Yes    Does the patient monitor blood sugar?          []  No []  Yes  []  N/A  Patient has: []  NO Hx DM   []  Pre-DM                 []  DM1  []   DM2 Does patient have a [employer] or Dexacom? []  No []  Yes   Fasting Blood Sugar Ranges-  Checks Blood Sugar _____ times a day  GLP1 agonist / usual dose -  GLP1 instructions:  SGLT-2 inhibitors / usual dose -  SGLT-2 instructions:   Blood Thinner / Instructions: Aspirin  Instructions:  Comments:   Activity level: Patient is able / unable to climb a flight of stairs without difficulty; []  No CP  []  No SOB, but would have ___   Patient can / can not perform ADLs without assistance.   Anesthesia review:   Patient denies shortness of breath, fever, cough and chest pain at PAT appointment.  Patient verbalized understanding and agreement to the Pre-Surgical Instructions that were given to them at this PAT appointment. Patient was also educated of the need to review these PAT instructions again prior to his/her surgery.I reviewed the appropriate phone numbers to call if they have any and questions or concerns.

## 2024-06-12 ENCOUNTER — Other Ambulatory Visit: Payer: Self-pay

## 2024-06-12 ENCOUNTER — Encounter (HOSPITAL_COMMUNITY)
Admission: RE | Admit: 2024-06-12 | Discharge: 2024-06-12 | Disposition: A | Discharge status: Home / Self Care | Source: Ambulatory Visit | Attending: Orthopedic Surgery | Admitting: Orthopedic Surgery

## 2024-06-12 ENCOUNTER — Encounter (HOSPITAL_COMMUNITY): Payer: Self-pay

## 2024-06-12 VITALS — BP 142/86 | HR 86 | Temp 97.9°F | Resp 12 | Ht 60.0 in | Wt 162.4 lb

## 2024-06-12 DIAGNOSIS — I10 Essential (primary) hypertension: Secondary | ICD-10-CM | POA: Diagnosis not present

## 2024-06-12 DIAGNOSIS — M1711 Unilateral primary osteoarthritis, right knee: Secondary | ICD-10-CM | POA: Insufficient documentation

## 2024-06-12 DIAGNOSIS — Z7984 Long term (current) use of oral hypoglycemic drugs: Secondary | ICD-10-CM | POA: Insufficient documentation

## 2024-06-12 DIAGNOSIS — I700 Atherosclerosis of aorta: Secondary | ICD-10-CM | POA: Diagnosis not present

## 2024-06-12 DIAGNOSIS — Z01812 Encounter for preprocedural laboratory examination: Secondary | ICD-10-CM | POA: Insufficient documentation

## 2024-06-12 DIAGNOSIS — K449 Diaphragmatic hernia without obstruction or gangrene: Secondary | ICD-10-CM | POA: Diagnosis not present

## 2024-06-12 DIAGNOSIS — R42 Dizziness and giddiness: Secondary | ICD-10-CM | POA: Diagnosis not present

## 2024-06-12 DIAGNOSIS — E119 Type 2 diabetes mellitus without complications: Secondary | ICD-10-CM | POA: Diagnosis not present

## 2024-06-12 DIAGNOSIS — K219 Gastro-esophageal reflux disease without esophagitis: Secondary | ICD-10-CM | POA: Diagnosis not present

## 2024-06-12 DIAGNOSIS — Z01818 Encounter for other preprocedural examination: Secondary | ICD-10-CM

## 2024-06-12 HISTORY — DX: Essential (primary) hypertension: I10

## 2024-06-12 HISTORY — DX: Type 2 diabetes mellitus without complications: E11.9

## 2024-06-12 LAB — HEMOGLOBIN A1C
Hgb A1c MFr Bld: 6.6 % — ABNORMAL HIGH (ref 4.8–5.6)
Mean Plasma Glucose: 142.72 mg/dL

## 2024-06-12 LAB — CBC
HCT: 41.4 % (ref 36.0–46.0)
Hemoglobin: 13.6 g/dL (ref 12.0–15.0)
MCH: 30 pg (ref 26.0–34.0)
MCHC: 32.9 g/dL (ref 30.0–36.0)
MCV: 91.4 fL (ref 80.0–100.0)
Platelets: 206 K/uL (ref 150–400)
RBC: 4.53 MIL/uL (ref 3.87–5.11)
RDW: 12.7 % (ref 11.5–15.5)
WBC: 7.4 K/uL (ref 4.0–10.5)
nRBC: 0 % (ref 0.0–0.2)

## 2024-06-12 LAB — BASIC METABOLIC PANEL WITH GFR
Anion gap: 9 (ref 5–15)
BUN: 18 mg/dL (ref 8–23)
CO2: 26 mmol/L (ref 22–32)
Calcium: 9.4 mg/dL (ref 8.9–10.3)
Chloride: 105 mmol/L (ref 98–111)
Creatinine, Ser: 0.97 mg/dL (ref 0.44–1.00)
GFR, Estimated: >60 mL/min (ref >60)
Glucose, Bld: 151 mg/dL — ABNORMAL HIGH (ref 70–99)
Potassium: 4.3 mmol/L (ref 3.5–5.1)
Sodium: 140 mmol/L (ref 135–145)

## 2024-06-12 LAB — SURGICAL PCR SCREEN
MRSA, PCR: NEGATIVE
Staphylococcus aureus: NEGATIVE

## 2024-06-12 LAB — GLUCOSE, CAPILLARY: Glucose-Capillary: 147 mg/dL — ABNORMAL HIGH (ref 70–99)

## 2024-06-12 NOTE — Progress Notes (Addendum)
 COVID Vaccine Completed:  Date of COVID positive in last 90 days:  No  PCP - Harlene Schroeder, MD Cardiologist - Deatrice Cage, MD  Cardiac clearance in Epic dated 06-05-24  Chest x-ray - N/A EKG - 05-18-24 Epic Stress Test - 05-25-24 Epic ECHO - N/A Cardiac Cath - N/A Pacemaker/ICD device last checked:  N/A Spinal Cord Stimulator: N/A  Bowel Prep - N/A  Sleep Study -  N/A CPAP -   Fasting Blood Sugar -  Checks Blood Sugar - does not check   Last dose of GLP1 agonist-  N/A GLP1 instructions:  Do not take after     Last dose of SGLT-2 inhibitors-  N/A SGLT-2 instructions:  Do not take after    Blood Thinner Instructions:  N/A Aspirin  Instructions: Last Dose:  Activity level:  Can go up a flight of stairs and perform activities of daily living without stopping and without symptoms of chest pain or shortness of breath.  Anesthesia review:  Chest pain evaluated by cardiology.   Patient states that she occasionally has some chest discomfort but thinks related to GERD  Patient denies shortness of breath, fever, cough and chest pain at PAT appointment  Patient verbalized understanding of instructions that were given to them at the PAT appointment. Patient was also instructed that they will need to review over the PAT instructions again at home before surgery.

## 2024-06-13 ENCOUNTER — Encounter (HOSPITAL_COMMUNITY): Payer: Self-pay

## 2024-06-13 NOTE — Progress Notes (Signed)
 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 PMH of HTN, aortic atherosclerosis, small hiatal hernia, GERD, vertigo, DM (A1c 6.6)  Patient evaluated by Cardiology on 05/18/24 for pre op clearance due to reported chest pain. Stress test was recommended although CP thought to be atypical. Stress test came back low risk. Per Dr. Darron, patient is low risk for surgery.  VS: BP (!) 142/86   Pulse 86   Temp 36.6 C (Oral)   Resp 12   Ht 5' (1.524 m)   Wt 73.7 kg   SpO2 98%   BMI 31.72 kg/m   PROVIDERS: Copland, Harlene BROCKS, MD   LABS: Labs reviewed: Acceptable for surgery. (all labs ordered are listed, but only abnormal results are displayed)  Labs Reviewed  HEMOGLOBIN A1C - Abnormal; Notable for the following components:      Result Value   Hgb A1c MFr Bld 6.6 (*)    All other components within normal limits  BASIC METABOLIC PANEL WITH GFR - Abnormal; Notable for the following components:   Glucose, Bld 151 (*)    All other components within normal limits  GLUCOSE, CAPILLARY - Abnormal; Notable for the following components:   Glucose-Capillary 147 (*)    All other components within normal limits  SURGICAL PCR SCREEN  CBC     IMAGES:   EKG: Normal sinus rhythm, rate 95 Cannot rule out Anterior infarct , age undetermined  CV:  Stress test 05/25/24:    Lexiscan  stress test without EKG changes from baseline   Perfusion scan shows Minimal thinning in the distal septal wall Overall normal perfusion. No signficant ischemia or scar.   Nuclear stress EF: 75%. The left ventricular ejection fraction is hyperdynamic (>65%). End diastolic cavity  size is normal.   CT images were obtained for attenuation correction and were examined for the presence of coronary calcium when appropriate.   Coronary calcium was present on the attenuation correction CT images. Very mild coronary calcifications were present. Coronary calcifications were present in the right coronary artery distribution(s).  Calcifications also present in aorta.   Prior study not available for comparison.   Overall low risk study   Past Medical History:  Diagnosis Date   Acid reflux    Arthritis    Brenner tumor    benign   Diabetes mellitus without complication (HCC)    Hypertension    Osteopenia    Ovarian cyst, bilateral    Pneumonia    PONV (postoperative nausea and vomiting)    Pre-diabetes    Vertigo    Vitamin D  deficiency     Past Surgical History:  Procedure Laterality Date   APPENDECTOMY     CHOLECYSTECTOMY     CYST EXCISION     CHEST    JOINT REPLACEMENT     OOPHORECTOMY  10/23/2006   DIAG LAP WITH BSO   PELVIC LAPAROSCOPY  10/23/2006   DIAG LAP WITH BSO   ROTATOR CUFF REPAIR     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   Procedure: TOTAL KNEE ARTHROPLASTY;  Surgeon: Ernie Cough, MD;  Location: [facility];  Service: Orthopedics;  Laterality: Left;  MEDICATIONS:  Blood Glucose Monitoring Suppl (ONETOUCH VERIO FLEX SYSTEM) w/Device KIT   cephALEXin  (KEFLEX ) 500 MG capsule   Cholecalciferol (VITAMIN D ) 50 MCG (2000 UT) tablet   glucose blood (ONETOUCH VERIO) test strip   Lancets (ONETOUCH DELICA PLUS LANCET33G) MISC   losartan  (COZAAR ) 25 MG tablet   meclizine  (ANTIVERT ) 25 MG tablet   metFORMIN  (GLUCOPHAGE ) 1000 MG tablet   raloxifene  (EVISTA ) 60 MG tablet   simvastatin  (ZOCOR ) 20 MG tablet   No current facility-administered medications for this encounter.   Burnard CHRISTELLA Odis DEVONNA [facility] Short Stay/Anesthesiology [facility] Phone [phone] 06/13/2024 11:34 AM

## 2024-06-13 NOTE — Anesthesia Preprocedure Evaluation (Signed)
 Anesthesia Evaluation    Airway        Dental   Pulmonary           Cardiovascular hypertension,      Neuro/Psych    GI/Hepatic   Endo/Other  diabetes    Renal/GU      Musculoskeletal   Abdominal   Peds  Hematology   Anesthesia Other Findings   Reproductive/Obstetrics                              Anesthesia Physical Anesthesia Plan  ASA:   Anesthesia Plan:    Post-op Pain Management:    Induction:   PONV Risk Score and Plan:   Airway Management Planned:   Additional Equipment:   Intra-op Plan:   Post-operative Plan:   Informed Consent:   Plan Discussed with:   Anesthesia Plan Comments: (See PAT note from 7/21)         Anesthesia Quick Evaluation

## 2024-06-19 ENCOUNTER — Emergency Department (HOSPITAL_BASED_OUTPATIENT_CLINIC_OR_DEPARTMENT_OTHER)

## 2024-06-19 ENCOUNTER — Encounter (HOSPITAL_BASED_OUTPATIENT_CLINIC_OR_DEPARTMENT_OTHER): Payer: Self-pay

## 2024-06-19 ENCOUNTER — Emergency Department (HOSPITAL_BASED_OUTPATIENT_CLINIC_OR_DEPARTMENT_OTHER)
Admission: EM | Admit: 2024-06-19 | Discharge: 2024-06-19 | Disposition: A | Discharge status: Home / Self Care | Attending: Emergency Medicine | Admitting: Emergency Medicine

## 2024-06-19 ENCOUNTER — Other Ambulatory Visit: Payer: Self-pay

## 2024-06-19 DIAGNOSIS — R1012 Left upper quadrant pain: Secondary | ICD-10-CM | POA: Diagnosis present

## 2024-06-19 DIAGNOSIS — K449 Diaphragmatic hernia without obstruction or gangrene: Secondary | ICD-10-CM | POA: Diagnosis not present

## 2024-06-19 DIAGNOSIS — R1032 Left lower quadrant pain: Secondary | ICD-10-CM | POA: Diagnosis not present

## 2024-06-19 DIAGNOSIS — K5730 Diverticulosis of large intestine without perforation or abscess without bleeding: Secondary | ICD-10-CM | POA: Diagnosis not present

## 2024-06-19 DIAGNOSIS — K5792 Diverticulitis of intestine, part unspecified, without perforation or abscess without bleeding: Secondary | ICD-10-CM

## 2024-06-19 DIAGNOSIS — K5732 Diverticulitis of large intestine without perforation or abscess without bleeding: Secondary | ICD-10-CM | POA: Insufficient documentation

## 2024-06-19 DIAGNOSIS — N281 Cyst of kidney, acquired: Secondary | ICD-10-CM | POA: Diagnosis not present

## 2024-06-19 DIAGNOSIS — I10 Essential (primary) hypertension: Secondary | ICD-10-CM | POA: Diagnosis not present

## 2024-06-19 LAB — COMPREHENSIVE METABOLIC PANEL WITH GFR
ALT: 19 U/L (ref 0–44)
AST: 18 U/L (ref 15–41)
Albumin: 4.1 g/dL (ref 3.5–5.0)
Alkaline Phosphatase: 54 U/L (ref 38–126)
Anion gap: 12 (ref 5–15)
BUN: 13 mg/dL (ref 8–23)
CO2: 24 mmol/L (ref 22–32)
Calcium: 9.5 mg/dL (ref 8.9–10.3)
Chloride: 103 mmol/L (ref 98–111)
Creatinine, Ser: 1.13 mg/dL — ABNORMAL HIGH (ref 0.44–1.00)
GFR, Estimated: 50 mL/min — ABNORMAL LOW (ref >60)
Glucose, Bld: 151 mg/dL — ABNORMAL HIGH (ref 70–99)
Potassium: 4.3 mmol/L (ref 3.5–5.1)
Sodium: 139 mmol/L (ref 135–145)
Total Bilirubin: 0.5 mg/dL (ref 0.0–1.2)
Total Protein: 6.8 g/dL (ref 6.5–8.1)

## 2024-06-19 LAB — URINALYSIS, ROUTINE W REFLEX MICROSCOPIC
Bilirubin Urine: NEGATIVE
Glucose, UA: NEGATIVE mg/dL
Ketones, ur: NEGATIVE mg/dL
Nitrite: NEGATIVE
Protein, ur: NEGATIVE mg/dL
Specific Gravity, Urine: 1.015 (ref 1.005–1.030)
pH: 5.5 (ref 5.0–8.0)

## 2024-06-19 LAB — CBC
HCT: 39 % (ref 36.0–46.0)
Hemoglobin: 13.2 g/dL (ref 12.0–15.0)
MCH: 29.7 pg (ref 26.0–34.0)
MCHC: 33.8 g/dL (ref 30.0–36.0)
MCV: 87.8 fL (ref 80.0–100.0)
Platelets: 187 K/uL (ref 150–400)
RBC: 4.44 MIL/uL (ref 3.87–5.11)
RDW: 13 % (ref 11.5–15.5)
WBC: 7.4 K/uL (ref 4.0–10.5)
nRBC: 0 % (ref 0.0–0.2)

## 2024-06-19 LAB — TROPONIN T, HIGH SENSITIVITY: Troponin T High Sensitivity: <15 ng/L (ref <19)

## 2024-06-19 LAB — URINALYSIS, MICROSCOPIC (REFLEX)

## 2024-06-19 LAB — LIPASE, BLOOD: Lipase: 33 U/L (ref 11–51)

## 2024-06-19 MED ORDER — SODIUM CHLORIDE 0.9 % IV BOLUS
1000.0000 mL | Freq: Once | INTRAVENOUS | Status: AC
  Administered 2024-06-19: 1000 mL via INTRAVENOUS

## 2024-06-19 MED ORDER — IOHEXOL 300 MG/ML  SOLN
100.0000 mL | Freq: Once | INTRAMUSCULAR | Status: AC | PRN
  Administered 2024-06-19: 100 mL via INTRAVENOUS

## 2024-06-19 MED ORDER — AMOXICILLIN-POT CLAVULANATE 875-125 MG PO TABS: 1.0000 | ORAL_TABLET | Freq: Two times a day (BID) | ORAL | 0 refills | Status: DC

## 2024-06-19 NOTE — Discharge Instructions (Addendum)
 Call Dr. Lyndle office today to let them know that you do have diverticulitis and are on antibiotics.  Follow-up with your primary care provider to ensure resolution.  If you develop new or worsening abdominal pain, fever, vomiting, or any other new/concerning symptoms then return to the ER.

## 2024-06-19 NOTE — ED Notes (Signed)
RN provided AVS using Teachback Method. Patient verbalizes understanding of Discharge Instructions. Opportunity for Questioning and Answers were provided by RN. Patient Discharged from ED ambulatory to home with family. ? ?

## 2024-06-19 NOTE — ED Triage Notes (Signed)
 Patient here POV from Home.  Endorses Pain to Left Sided ABD. Began a few days ago; Constant and waxes and wanes in intensity. Radiates to Lower ABD and Back. No N/V/D. Thought constipation but relieved with Dulcolax. No Fevers.   NAD noted during triage. A&Ox4. GCS 15. Ambulatory.

## 2024-06-19 NOTE — ED Notes (Signed)
 Patient transported to Radiology

## 2024-06-19 NOTE — ED Provider Notes (Signed)
 [name] Provider Note   CSN: [id number] Arrival date & time: 06/19/24  9290     Patient presents with: Abdominal Pain   Natalie Fowler is a 78 y.o. female.   HPI 78 year old female presents with left-sided abdominal pain.  Symptoms started 2 days ago but was mild.  However those symptoms have been getting worse yesterday and continued this morning.  She is due to have knee surgery tomorrow and wants to make sure everything is okay and that she can proceed or whether she should cancel.  Patient states that the pain is primarily when she walks around or if she palpates her left abdomen.  She has not noticed a rash.  No shortness of breath, nausea, vomiting, back pain, urinary symptoms, diarrhea.  She thought she was constipated and took some Dulcolax and had a bowel movement but had no change in the symptoms.  Right now the pain is about a 8.  No change with eating.  Prior to Admission medications   Medication Sig Start Date End Date Taking? Authorizing Provider  amoxicillin -clavulanate (AUGMENTIN ) 875-125 MG tablet Take 1 tablet by mouth every 12 (twelve) hours. 06/19/24  Yes Freddi Hamilton, MD  Blood Glucose Monitoring Suppl (ONETOUCH VERIO FLEX SYSTEM) w/Device KIT Use to check blood pressure once a day.  Dx code: E11.9 03/27/24   Copland, Harlene BROCKS, MD  Cholecalciferol (VITAMIN D ) 50 MCG (2000 UT) tablet Take 2,000 Units by mouth daily.    [provider]  glucose blood (ONETOUCH VERIO) test strip Use to check sugar once a day.  Dx code: E11.9 03/27/24   Copland, Harlene BROCKS, MD  Lancets [facility] DELICA PLUS [place]) MISC Use to check sugar once a day.  Dx Code: E11.9 03/27/24   Copland, Harlene BROCKS, MD  losartan  (COZAAR ) 25 MG tablet Take 1 tablet (25 mg total) by mouth daily. 04/05/24   Copland, Harlene BROCKS, MD  meclizine  (ANTIVERT ) 25 MG tablet Take 1 tablet (25 mg total) by mouth 3 (three) times daily as needed for dizziness. Patient  not taking: Reported on 05/18/2024 07/08/22   Copland, Harlene BROCKS, MD  metFORMIN  (GLUCOPHAGE ) 1000 MG tablet Take 1 tablet (1,000 mg total) by mouth 2 (two) times daily with a meal. Patient taking differently: Take 500-1,000 mg by mouth See admin instructions. Take 1000 mg by mouth in the morning and 500 mg in the evening 07/19/23   Copland, Harlene BROCKS, MD  raloxifene  (EVISTA ) 60 MG tablet Take 1 tablet (60 mg total) by mouth daily. 07/19/23   Copland, Harlene BROCKS, MD  simvastatin  (ZOCOR ) 20 MG tablet Take 1 tablet (20 mg total) by mouth at bedtime. 05/23/24   Copland, Harlene BROCKS, MD    Allergies: Pantoprazole  and Vioxx [rofecoxib]    Review of Systems  Constitutional:  Negative for fever.  Respiratory:  Negative for shortness of breath.   Cardiovascular:  Negative for chest pain.  Gastrointestinal:  Positive for abdominal pain. Negative for diarrhea, nausea and vomiting.  Genitourinary:  Negative for dysuria and hematuria.    Updated Vital Signs BP (!) 161/73 (BP Location: Right Arm)   Pulse 95   Temp 98.3 F (36.8 C) (Oral)   Resp 18   Wt 77.2 kg   SpO2 98%   BMI 33.24 kg/m   Physical Exam Vitals and nursing note reviewed.  Constitutional:      General: She is not in acute distress.    Appearance: She is well-developed. She is not ill-appearing  or diaphoretic.  HENT:     Head: Normocephalic and atraumatic.  Cardiovascular:     Rate and Rhythm: Normal rate and regular rhythm.     Heart sounds: Normal heart sounds.  Pulmonary:     Effort: Pulmonary effort is normal.     Breath sounds: Normal breath sounds.  Abdominal:     Palpations: Abdomen is soft.     Tenderness: There is abdominal tenderness in the left upper quadrant and left lower quadrant. There is no right CVA tenderness or left CVA tenderness.  Skin:    General: Skin is warm and dry.     Findings: No rash.  Neurological:     Mental Status: She is alert.     (all labs ordered are listed, but only abnormal results are  displayed) Labs Reviewed  COMPREHENSIVE METABOLIC PANEL WITH GFR - Abnormal; Notable for the following components:      Result Value   Glucose, Bld 151 (*)    Creatinine, Ser 1.13 (*)    GFR, Estimated 50 (*)    All other components within normal limits  LIPASE, BLOOD  CBC  URINALYSIS, ROUTINE W REFLEX MICROSCOPIC  TROPONIN T, HIGH SENSITIVITY    EKG: EKG Interpretation Date/Time:  Monday June 19 2024 07:47:03 EDT Ventricular Rate:  90 PR Interval:  136 QRS Duration:  77 QT Interval:  354 QTC Calculation: 434 R Axis:   43  Text Interpretation: Sinus rhythm Low voltage, precordial leads no acute ST/T changes similar to June 2025 Confirmed by Freddi Hamilton [phone]) on 06/19/2024 7:49:15 AM  Radiology: CT ABDOMEN PELVIS W CONTRAST Result Date: 06/19/2024 EXAM: CT ABDOMEN AND PELVIS WITH CONTRAST 06/19/2024 08:23:01 AM TECHNIQUE: CT of the abdomen and pelvis was performed with the administration of intravenous contrast. Multiplanar reformatted images are provided for review. Automated exposure control, iterative reconstruction, and/or weight based adjustment of the mA/kV was utilized to reduce the radiation dose to as low as reasonably achievable. COMPARISON: 04/17/2022 and 08/12/2018 CLINICAL HISTORY: LLQ abdominal pain. Endorses pain to left sided abdomen. Began a few days ago; constant and waxes and wanes in intensity. Radiates to lower abdomen and back. No N/V/D. Thought constipation but relieved with Dulcolax. No fevers. FINDINGS: LOWER CHEST: Right lower lobe lung nodule measures 3 mm and is unchanged from 04/17/2022. Right lower lobe lung nodule measures 4 mm and is unchanged from 04/17/2022. LIVER: Subjective diffuse hepatic steatosis. 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 No suspicious liver lesions. GALLBLADDER AND BILE DUCTS: Status post cholecystectomy. No  significant bile duct dilatation. SPLEEN: Scattered cysts are identified within the spleen, unchanged in size and multiplicity when compared with the previous exam. The largest measures 1.4 cm. PANCREAS: No acute abnormality. ADRENAL GLANDS: No acute abnormality. KIDNEYS, URETERS AND BLADDER: Bosniak class 1 and 2 kidney cysts are identified, the largest is of the upper pole of the right kidney measuring 2.1 cm. No follow up imaging recommended for these kidney cysts unless otherwise clinically indicated. No kidney stones or signs of obstructive uropathy. GI AND BOWEL: Small hiatal hernia. The appendix is not visualized and is presumed to be surgically absent. No pathologic dilatation of the large or small bowel loops. Colonic diverticulosis. Focal inflammatory changes are identified involving the distal half of the sigmoid colon with surrounding inflammatory soft tissue stranding and thickening of the adjacent peritoneal reflection. No abscess identified. PERITONEUM AND RETROPERITONEUM: No ascites. No free air.  VASCULATURE: Aortic atherosclerosis. No aneurysm. Patent abdominal vascularity. LYMPH NODES: No signs of abdominal or pelvic adenopathy. REPRODUCTIVE ORGANS: No acute abnormality. BONES AND SOFT TISSUES: Multilevel lumbar spondylosis within the lumbar spine. No acute osseous abnormality. No focal soft tissue abnormality. IMPRESSION: 1. Colonic diverticulosis acute diverticulitis involving the descending colon. No signs of free perforation or abscess. 2. Aortic Electronically signed by: Waddell Calk MD 06/19/2024 08:43 AM EDT RP Workstation: [id number]     Procedures   Medications Ordered in the ED  sodium chloride  0.9 % bolus 1,000 mL (1,000 mLs Intravenous New Bag/Given 06/19/24 0827)  iohexol  (OMNIPAQUE ) 300 MG/ML solution 100 mL (100 mLs Intravenous Contrast Given 06/19/24 0810)                                    Medical Decision Making Amount and/or Complexity of Data Reviewed External  Data Reviewed: notes. Labs: ordered.    Details: Mild bump in creatinine.  Normal WBC Radiology: ordered and independent interpretation performed.    Details: Diverticulitis  Risk Prescription drug management.   CT shows diverticulitis which would explain her symptoms.  I did send an EKG as she states he gets worse when she walks but I suspect this is more from irritation.  No dyspnea.  I doubt this is a cardiac cause.  Otherwise she has declined pain meds and is comfortable here.  Will treat as an outpatient with Augmentin .  No red flags such as abscess or perforation.  Will have her call her orthopedist office today in regards to having an active infection since she is due to have surgery tomorrow.  Otherwise can follow-up with PCP.  Discussed and will give return precautions.     Final diagnoses:  Acute diverticulitis    ED Discharge Orders          Ordered    amoxicillin -clavulanate (AUGMENTIN ) 875-125 MG tablet  Every 12 hours        06/19/24 0854               Freddi Hamilton, MD 06/19/24 [phone]

## 2024-06-20 ENCOUNTER — Ambulatory Visit (INDEPENDENT_AMBULATORY_CARE_PROVIDER_SITE_OTHER): Admitting: Family Medicine

## 2024-06-20 ENCOUNTER — Ambulatory Visit (HOSPITAL_COMMUNITY): Admission: RE | Admit: 2024-06-20 | Source: Home / Self Care | Admitting: Orthopedic Surgery

## 2024-06-20 ENCOUNTER — Encounter: Payer: Self-pay | Admitting: Family Medicine

## 2024-06-20 ENCOUNTER — Encounter (HOSPITAL_COMMUNITY): Admission: RE | Payer: Self-pay | Source: Home / Self Care

## 2024-06-20 ENCOUNTER — Encounter (HOSPITAL_COMMUNITY): Payer: Self-pay | Admitting: Medical

## 2024-06-20 ENCOUNTER — Ambulatory Visit: Payer: Self-pay

## 2024-06-20 VITALS — BP 132/74 | HR 97 | Temp 98.0°F | Resp 16 | Ht 61.0 in | Wt 163.8 lb

## 2024-06-20 DIAGNOSIS — K5792 Diverticulitis of intestine, part unspecified, without perforation or abscess without bleeding: Secondary | ICD-10-CM | POA: Diagnosis not present

## 2024-06-20 SURGERY — ARTHROPLASTY, KNEE, TOTAL
Anesthesia: Spinal | Site: Knee | Laterality: Right

## 2024-06-20 NOTE — Patient Instructions (Addendum)
 OK to take Tylenol  1000 mg (2 extra strength tabs) or 975 mg (3 regular strength tabs) every 6 hours as needed.  There is no data to suggest certain foods should be universally avoided with diverticulosis.   I would avoid anything with seeds or nuts until after your knee surgery to be on the safe side though.   Take Metamucil or Benefiber daily. Stay hydrated.   Let us  know if you need anything.

## 2024-06-20 NOTE — Progress Notes (Signed)
 Chief Complaint  Patient presents with   Follow-up    Follow Up Diverticulitis     Subjective Natalie Fowler is a 78 y.o. female who presents with LLQ abd pain.  Symptoms began 3 d ago.  CT yesterday showed diverticulitis.  Patient has L sided abd pain.  Patient denies vomiting, diarrhea, fever, and nausea Treatment to date: Augmentin  started yesterday  Past Medical History:  Diagnosis Date   Acid reflux    Arthritis    Brenner tumor    benign   Diabetes mellitus without complication (HCC)    Hypertension    Osteopenia    Ovarian cyst, bilateral    Pneumonia    PONV (postoperative nausea and vomiting)    Vertigo    Vitamin D  deficiency     Exam BP 132/74 (BP Location: Left Arm, Patient Position: Sitting)   Pulse 97   Temp 98 F (36.7 C) (Oral)   Resp 16   Ht 5' 1 (1.549 m)   Wt 163 lb 12.8 oz (74.3 kg)   SpO2 95%   BMI 30.95 kg/m  General:  well developed, well hydrated, in no apparent distress Skin:  warm, no pallor or diaphoresis, no rashes Throat/Pharynx:  lips and gingiva without lesion; tongue and uvula midline; non-inflamed pharynx; no exudates or postnasal drainage Lungs:  clear to auscultation, breath sounds equal bilaterally, no respiratory distress, no wheezes Cardio:  RRR Abdomen:  abdomen soft, TTP over L side of abd; bowel sounds normal; no masses or organomegaly Psych: Appropriate judgement/insight  Assessment and Plan  Acute diverticulitis  Finish abx. Tylenol  prn. Stay hydrated.  Avoid aggravating foods, discussed advancing diet. F/u if symptoms fail to improve, sooner if worsening. The patient voiced understanding and agreement to the plan.  Mabel [place], DO 06/20/24  4:56 PM

## 2024-06-20 NOTE — Telephone Encounter (Signed)
 FYI Only or Action Required?: FYI only for provider.  Patient was last seen in primary care on 03/27/2024 by Copland, Harlene BROCKS, MD.  Called Nurse Triage reporting Abdominal Pain.  Symptoms began several days ago.  Interventions attempted: Prescription medications: Amoxicillin .  Symptoms are: gradually improving.  Triage Disposition: See HCP Within 4 Hours (Or PCP Triage)  Patient/caregiver understands and will follow disposition?: Yes     pt was in er . She has acute diverticulitis please follow up with pt.        Reason for Disposition  [1] MILD-MODERATE pain AND [2] constant AND [3] present > 2 hours  Answer Assessment - Initial Assessment Questions 1. LOCATION: Where does it hurt?      Left side abdominal pain  2. RADIATION: Does the pain shoot anywhere else? (e.g., chest, back)     No 3. ONSET: When did the pain begin? (e.g., minutes, hours or days ago)      3 days ago  4. SUDDEN: Gradual or sudden onset?     Gradual  5. PATTERN Does the pain come and go, or is it constant?     Constant  6. SEVERITY: How bad is the pain?  (e.g., Scale 1-10; mild, moderate, or severe)     Moderate  7. RECURRENT SYMPTOM: Have you ever had this type of stomach pain before? If Yes, ask: When was the last time? and What happened that time?      No 8. CAUSE: What do you think is causing the stomach pain? (e.g., gallstones, recent abdominal surgery)     Diverticulitis  10. OTHER SYMPTOMS: Do you have any other symptoms? (e.g., back pain, diarrhea, fever, urination pain, vomiting)       No  Protocols used: Abdominal Pain - Female-A-AH

## 2024-06-23 NOTE — Progress Notes (Signed)
 GASTROENTEROLOGY OUTPATIENT OFFICE NOTE  PCP PHYSICIAN: Harlene Elaine Schroeder, MD  CC:  diverticulitis No chief complaint on file.   HISTORY OF PRESENT ILLNESS: Natalie Fowler is a 78 y.o. female with a significant PMHx including DM2, HLD, chest pain (neg stress test 2022), LE edema, LBP, vertigo, PUD, and colon polyps (TA, SSP), obesity, s/p cholecystectomy and appendectomy  who presents to the office today for ER fup. Her chart reviewed and followed by Ronal BIRCH. Shearin, MD ; last seen in 7/24 for fup of chronic cough, chronic epigastric pain and heartburn which improved w/ famotidine  40mg  bid.  Pt wanted to try lower dose.  To try 20mg  bid and if recurrent sxs, increase to 40mg  bid again.  Seen at [facility] ER 06/19/24 for LLQ pain x 2d.   Normal cbc and cmp unremarkable/ GFR 50.  Ct w diverticulitis. Treated w 7d of Augmentin .   Has completed abx. Feeling better.  No abd pain. Denies bloody stools or wt loss. Frustrated that could not be seen at our office for the pain and then post ER visit it took awhile to get into our office; then could not see Dr. Marvis.  Has upcoming knee surgery.  Also wants refills of pepcid  20mg  bid.  This is controlling any ugi series.    Pantoprazole   caused her to have cough and SOB when tried in the past.    06/19/24 ct a/p w IMPRESSION:  1. Colonic diverticulosis acute diverticulitis involving the descending colon.  No signs of free perforation or abscess.  2. Aortic  Electronically signed by: Waddell Calk MD 06/19/2024 08:43 AM EDT        4/24 EGD for chronic cough, chronic epigastric pain and GERD: Impression Grade C esophagitis in the GE junction; performed cold forceps biopsy Medium type I hiatal hernia Mild erythematous mucosa in the stomach; performed cold forceps biopsy The pylorus appeared normal. The duodenal bulb, 1st part of the duodenum and 2nd part of the duodenum appeared normal. Recommendation   Await pathology results     Avoid NSAIDS    Resume regular diet     Discontinue Pepcid , and instead take Pantoprazole  40 mg each morning, before breakfast. Rx was submitted electronically to pharmacy of record.  Please schedule [facility] office follow up in 3 months.       A.  GASTRIC, BIOPSY:                GASTRIC ANTRAL AND OXYNTIC/BODY TYPE MUCOSA WITH MILD CHRONIC INFLAMMATION              AND MUCOSAL CONGESTION.                NO EVIDENCE OF ACUTE INFLAMMATION OR MALIGNANCY.   B.  GE JUNCTION, BIOPSY:                JUNCTIONAL CARDIAL MUCOSA WITH ABUNDANT ACUTE, CHRONIC INFLAMMATION AND              REACTIVE EPITHELIAL CHANGES.                NO EVIDENCE OF INTESTINAL METAPLASIA, DYSPLASIA OR MALIGNANCY. Pt told to follow gerd diet and take pantoprazole  40mg /d. She reports she was unable to take pantoprazole  because it caused her to have cough and SOB. She was switched to famotidine  40mg  bid.   4/22 colonoscopy due to personal h/o colon polyps and family h/o colon cancer. Findings included moderate diffuse diverticulosis and medium internal/external hemorrhoids. CABA recommended 4/27 after 2 day  prep.    (CT chest) 5/23 revealed no acute abnormality in upper abdomen, although hepatic steatosis and small HH were noted.    Last CTAP with contrast was performed 9/19 and was nondiagnostic for source of chronic pain.    ALLERGIES: Allergies[1]  MEDICATIONS: Current Medications[2]  PAST MEDICAL HISTORY: Problem List[3]  PAST SURGICAL HISTORY: Surgical History[4]  SOCIAL HISTORY: Tobacco Use History[5] Social History   Substance and Sexual Activity  Alcohol Use Never   Social History   Substance and Sexual Activity  Drug Use Not on file    FAMILY HISTORY: Family History[6]  ROS: Complete ROS performed and pertinent positive and negatives are noted in HPI.     LABS: Pertinent labs per HPI  IMAGING:  Pertinent GI imaging per HPI  VITAL SIGNS:  Vitals:   07/03/24 1258  BP: 120/78   Pulse: 78  Resp: 12  SpO2: 96%    PHYSICAL EXAM: Well developed, well nourished.  No acute distress.   Skin warm and dry.  HEENT: Normocephalic, atraumatic. No scleral icterus.  No oropharyngeal lesions, mucosa pink and moist. Neck supple. Lungs:Respiratory effort unlabored. Clear to auscultation.  Cardiac: Rhythm regular with no murmur. Abdomen:  Active bowel sounds,soft, nondistended, nontender  Extremities without  clubbing, cyanosis, edema.   ASSESSMENT  1. History of colonic diverticulitis   2. Gastroesophageal reflux disease with esophagitis without hemorrhage   Resolved- needs fup colonoscopy On bid pepcid  20mg   Orders Placed This Encounter  Procedures  . Endo Colon    PLAN Colonoscopy w/ 2d prep; wants to schedule in December d/t upcoming knee surgery DM med adjustments  Preparation: Miralax  2d Endoscopist: Ronal BIRCH. Shearin, MD ASA: 3 Mallampati: 2 Anesthesia plan: Propofol  at [facility]  I discussed the nature of the recommended Colonoscopy , as well as the indications, risks, alternatives and potential complications including, but not limited to, bleeding, infection, reaction to medication, damage to internal organs, cardiac and/or pulmonary problems, and perforation requiring surgery (1 to 2 in 1000). The possibility that significant findings could be missed was explained. Any questions the patient had were answered. The patient gives consent for the procedure.       [1] Allergies Allergen Reactions  . Pantoprazole  Cough  . Rofecoxib Rash  [2]  Current Outpatient Medications:  .  albuterol  HFA (PROVENTIL  HFA;VENTOLIN  HFA;PROAIR  HFA) 90 mcg/actuation inhaler, INHALE 2 PUFFS BY MOUTH EVERY 6 HOURS AS NEEDED FOR WHEEZING FOR SHORTNESS OF BREATH, Disp: , Rfl:  .  amoxicillin  (AMOXIL ) 500 mg capsule, , Disp: , Rfl:  .  benzonatate  (TESSALON ) 100 mg capsule, , Disp: , Rfl:  .  cholecalciferol (VITAMIN D3) 1,000 unit (25 mcg) tablet, Take  1,000 Units by mouth Once Daily., Disp: , Rfl:  .  famotidine  (PEPCID ) 20 mg tablet, Take 1 tablet (20 mg total) by mouth 2 (two) times a day., Disp: 180 tablet, Rfl: 3 .  gabapentin (NEURONTIN) 100 mg capsule, TAKE 1 CAPSULE BY MOUTH THREE TIMES DAILY AS DIRECTED MAY INCREASE DOSE TO 3 TABLETS AT BEDTIME, Disp: , Rfl:  .  glipiZIDE  (GLUCOTROL  XL) 2.5 mg 24 hr tablet, Take 2.5 mg by mouth., Disp: , Rfl:  .  lisinopriL  (PRINIVIL ) 5 mg tablet, Take 1 tablet by mouth Once Daily., Disp: , Rfl:  .  losartan  (COZAAR ) 25 mg tablet, Take 25 mg by mouth daily., Disp: , Rfl:  .  lovastatin  (MEVACOR ) 20 mg tablet, lovastatin  20 mg tablet TAKE 1 TABLET BY MOUTH AT BEDTIME, Disp: ,  Rfl:  .  meclizine  (ANTIVERT ) 25 mg tablet, Take 1 tablet by mouth 3 (three) times a day as needed., Disp: , Rfl:  .  metFORMIN  (GLUCOPHAGE ) 500 mg tablet, Take 1 tablet by mouth Once Daily., Disp: , Rfl:  .  OneTouch Delica Plus Lancet 33 gauge misc, USE 1 LANCET TO CHECK GLUCOSE IN THE MORNING, AT NOON, AND AT BEDTIME., Disp: , Rfl:  .  OneTouch Ultra Test test strip, USE ONE STRIP TO CHECK GLUCOSE AS NEEDED ONCE DAILY, Disp: , Rfl:  .  OneTouch Ultra2 Meter misc, USE TO CHECK GLUCOSE ONCE DAILY AS NEEDED, Disp: , Rfl:  .  raloxifene  (EVISTA ) 60 mg tablet, Take 1 tablet by mouth Once Daily., Disp: , Rfl:  .  simvastatin  (ZOCOR ) 20 mg tablet, Take 1 tablet by mouth nightly., Disp: , Rfl:  .  tiotropium bromide (Spiriva  Respimat) 1.25 mcg/actuation mist inhaler, INHALE 2 SPRAY(S) BY MOUTH ONCE DAILY, Disp: , Rfl:  [3] Patient Active Problem List Diagnosis  . Chronic cough  . Gastroesophageal reflux disease  . Epigastric pain  . Dyslipidemia  . History of vitamin D  deficiency  . Osteopenia  [4] Past Surgical History: Procedure Laterality Date  . APPENDECTOMY    . CHOLECYSTECTOMY    [5] Social History Tobacco Use  Smoking Status Never  Smokeless Tobacco Not on file  [6] Family History Problem Relation Name Age of  Onset  . Glaucoma Neg Hx    . Macular degeneration Neg Hx

## 2024-07-03 DIAGNOSIS — Z8719 Personal history of other diseases of the digestive system: Secondary | ICD-10-CM | POA: Diagnosis not present

## 2024-07-03 DIAGNOSIS — K2100 Gastro-esophageal reflux disease with esophagitis, without bleeding: Secondary | ICD-10-CM | POA: Diagnosis not present

## 2024-07-03 NOTE — Telephone Encounter (Signed)
 This pt wanted to talk to a manager re: her difficulties w/ scheduling appointments.

## 2024-07-04 NOTE — H&P (Signed)
 TOTAL KNEE ADMISSION H&P  Patient is being admitted for right total knee arthroplasty.  Therapy Plans: HHPT x 2 weeks, then [facility] 2 weeks later Disposition: Home with husband, daughter helping some Planned DVT Prophylaxis: aspirin  81mg  BID DME needed: walker PCP: clearance received Cardio: Dr. [place] - TXA: Allergies: Vioxx - rash, rofecoxib - rash Anesthesia Concerns: vomiting BMI: 31.3 Last HgbA1c: will recheck   Other: - Tylenol , low dose celebrex/toradol , robaxin , oxycodone , zofran  - Significant pain for a year after the last surgery - Limited help at home from husband, daughter lives 30 mins away - Please talk only to daughter post op, husband has memory issues - no hx of VTE or cancer - [facility] - staying overnight  - Daughter's number: Verneita Blush [phone]  Subjective:  Chief Complaint:right knee pain.  HPI: Natalie Fowler, 78 y.o. female, has a history of pain and functional disability in the right knee due to arthritis and has failed non-surgical conservative treatments for greater than 12 weeks to includeNSAID's and/or analgesics, corticosteriod injections, viscosupplementation injections, and activity modification.  Onset of symptoms was gradual, starting 2 years ago with gradually worsening course since that time. The patient noted no past surgery on the right knee(s).  Patient currently rates pain in the right knee(s) at 8 out of 10 with activity. Patient has worsening of pain with activity and weight bearing, pain that interferes with activities of daily living, and pain with passive range of motion.  Patient has evidence of joint space narrowing by imaging studies. There is no active infection.  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   Dyslipidemia 05/02/2021   Diet-controlled diabetes mellitus (HCC) 01/20/2021   Low back pain 11/19/2016   History of vitamin D  deficiency  09/30/2016   H/O colonoscopy with polypectomy 09/30/2015   Vaginal atrophy 09/25/2014   Vertigo    Osteopenia    Acid reflux    Past Medical History:  Diagnosis Date   Acid reflux    Arthritis    Harrold tumor    benign   Diabetes mellitus without complication (HCC)    Hypertension    Osteopenia    Ovarian cyst, bilateral    Pneumonia    PONV (postoperative nausea and vomiting)    Vertigo    Vitamin D  deficiency     Past Surgical History:  Procedure Laterality Date   APPENDECTOMY     CHOLECYSTECTOMY     CYST EXCISION     CHEST    JOINT REPLACEMENT     OOPHORECTOMY  10/23/2006   DIAG LAP WITH BSO   PELVIC LAPAROSCOPY  10/23/2006   DIAG LAP WITH BSO   ROTATOR CUFF REPAIR     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   Procedure: TOTAL KNEE ARTHROPLASTY;  Surgeon: Ernie Cough, MD;  Location: [facility];  Service: Orthopedics;  Laterality: Left;    No current facility-administered medications for this encounter.   Current Outpatient Medications  Medication Sig Dispense Refill Last Dose/Taking   amoxicillin -clavulanate (AUGMENTIN ) 875-125 MG tablet Take 1 tablet by mouth every 12 (twelve) hours. 14 tablet 0    Blood Glucose Monitoring Suppl (ONETOUCH VERIO FLEX SYSTEM) w/Device KIT Use to check blood pressure once a day.  Dx code: E11.9 1 kit 0    Cholecalciferol (VITAMIN D ) 50 MCG (2000 UT) tablet Take 2,000 Units by mouth daily.      glucose blood (ONETOUCH VERIO) test strip Use to  check sugar once a day.  Dx code: E11.9 100 each 1    Lancets (ONETOUCH DELICA PLUS LANCET33G) MISC Use to check sugar once a day.  Dx Code: E11.9 100 each 1    losartan  (COZAAR ) 25 MG tablet Take 1 tablet (25 mg total) by mouth daily. 90 tablet 1    meclizine  (ANTIVERT ) 25 MG tablet Take 1 tablet (25 mg total) by mouth 3 (three) times daily as needed for dizziness. (Patient not taking: Reported on 05/18/2024) 30 tablet 0    metFORMIN  (GLUCOPHAGE ) 1000 MG tablet Take 1 tablet (1,000 mg total) by  mouth 2 (two) times daily with a meal. (Patient taking differently: Take 500-1,000 mg by mouth See admin instructions. Take 1000 mg by mouth in the morning and 500 mg in the evening) 180 tablet 3    raloxifene  (EVISTA ) 60 MG tablet Take 1 tablet (60 mg total) by mouth daily. 90 tablet 3    simvastatin  (ZOCOR ) 20 MG tablet Take 1 tablet (20 mg total) by mouth at bedtime. 90 tablet 1    Allergies  Allergen Reactions   Pantoprazole  Cough   Vioxx [Rofecoxib] Rash    Social History   Tobacco Use   Smoking status: Never   Smokeless tobacco: Never  Substance Use Topics   Alcohol use: No    Alcohol/week: 0.0 standard drinks of alcohol    Family History  Problem Relation Age of Onset   Diabetes Mother    Hypertension Mother    Colon cancer Father    Heart failure Father    Heart disease Father    Osteoporosis Sister    Pancreatic cancer Sister 34   Heart disease Maternal Aunt    Ovarian cancer Maternal Aunt    Heart attack Maternal Uncle    Colon cancer Paternal Uncle    Hypertension Daughter    Breast cancer Neg Hx    BRCA 1/2 Neg Hx      Review of Systems  Constitutional:  Negative for chills and fever.  Respiratory:  Negative for cough and shortness of breath.   Cardiovascular:  Negative for chest pain.  Gastrointestinal:  Negative for nausea and vomiting.  Musculoskeletal:  Positive for arthralgias.     Objective:  Physical Exam Well nourished and well developed. General: Alert and oriented x3, cooperative and pleasant, no acute distress.  Musculoskeletal: Right knee exam: No palpable effusion, warmth erythema Slight flexion contracture with flexion over 110 degrees Tenderness over the medial and anterior aspect knee Stable medial and lateral collateral ligaments  Vital signs in last 24 hours:    Labs:   Estimated body mass index is 30.95 kg/m as calculated from the following:   Height as of 06/20/24: 5' 1 (1.549 m).   Weight as of 06/20/24: 74.3  kg.   Imaging Review Plain radiographs demonstrate severe degenerative joint disease of the right knee(s). The overall alignment isneutral. The bone quality appears to be adequate for age and reported activity level.      Assessment/Plan:  End stage arthritis, right knee   The patient history, physical examination, clinical judgment of the provider and imaging studies are consistent with end stage degenerative joint disease of the right knee(s) and total knee arthroplasty is deemed medically necessary. The treatment options including medical management, injection therapy arthroscopy and arthroplasty were discussed at length. The risks and benefits of total knee arthroplasty were presented and reviewed. The risks due to aseptic loosening, infection, stiffness, patella tracking problems, thromboembolic complications and other imponderables  were discussed. The patient acknowledged the explanation, agreed to proceed with the plan and consent was signed. Patient is being admitted for inpatient treatment for surgery, pain control, PT, OT, prophylactic antibiotics, VTE prophylaxis, progressive ambulation and ADL's and discharge planning. The patient is planning to be discharged home.     Patient's anticipated LOS is less than 2 midnights, meeting these requirements: - Younger than 7 - Lives within 1 hour of care - Has a competent adult at home to recover with post-op recover - NO history of  - Chronic pain requiring opiods  - Diabetes  - Coronary Artery Disease  - Heart failure  - Heart attack  - Stroke  - DVT/VTE  - Cardiac arrhythmia  - Respiratory Failure/COPD  - Renal failure  - Anemia  - Advanced Liver disease  Rosina Calin, PA-C Orthopedic Surgery [facility] [place] [phone]

## 2024-07-05 NOTE — Progress Notes (Addendum)
 PT was rescheduled from 06/20/24 to 07/11/24 due ot exacerbation of diverticulitis.  PT went to ER on 06/19/24.  For acute diverticulitis.   then was followed up with PCP, Mabel Pry on 06/20/24  then GI Rosaline Odea on 07/03/24.  All notes in epic.  PT states Dr Ernie is aware.   Reviewed preop instructions with pt to include date and time and review of liquids and showers. PT prefers not to dirnk G2 lower sugar Gatorade.  PT states she only drink water .  Instructed pt that she could only drink water  that am before 0640am,.

## 2024-07-06 NOTE — Anesthesia Preprocedure Evaluation (Signed)
 Anesthesia Evaluation  Patient identified by MRN, date of birth, ID band Patient awake    Reviewed: Allergy & Precautions, H&P , NPO status , Patient's Chart, lab work & pertinent test results  History of Anesthesia Complications (+) PONV and history of anesthetic complications  Airway Mallampati: II   Neck ROM: full    Dental   Pulmonary neg pulmonary ROS   breath sounds clear to auscultation       Cardiovascular hypertension,  Rhythm:regular Rate:Normal     Neuro/Psych    GI/Hepatic ,GERD  ,,  Endo/Other  diabetes, Type 2    Renal/GU      Musculoskeletal  (+) Arthritis ,    Abdominal   Peds  Hematology   Anesthesia Other Findings   Reproductive/Obstetrics                              Anesthesia Physical Anesthesia Plan  ASA: 3  Anesthesia Plan: MAC and Spinal   Post-op Pain Management: Regional block*   Induction: Intravenous  PONV Risk Score and Plan: 3 and Ondansetron , Propofol  infusion and Treatment may vary due to age or medical condition  Airway Management Planned: Simple Face Mask  Additional Equipment:   Intra-op Plan:   Post-operative Plan:   Informed Consent: I have reviewed the patients History and Physical, chart, labs and discussed the procedure including the risks, benefits and alternatives for the proposed anesthesia with the patient or authorized representative who has indicated his/her understanding and acceptance.     Dental advisory given  Plan Discussed with: CRNA, Anesthesiologist and Surgeon  Anesthesia Plan Comments: (See note from 7/21. Pt rescheduled due to diverticulitis. Now recovered.)         Anesthesia Quick Evaluation

## 2024-07-11 ENCOUNTER — Encounter (HOSPITAL_COMMUNITY)
Admission: RE | Disposition: A | Discharge status: Home / Self Care | Payer: Self-pay | Source: Home / Self Care | Attending: Orthopedic Surgery

## 2024-07-11 ENCOUNTER — Ambulatory Visit (HOSPITAL_BASED_OUTPATIENT_CLINIC_OR_DEPARTMENT_OTHER): Payer: Self-pay | Admitting: Medical

## 2024-07-11 ENCOUNTER — Other Ambulatory Visit: Payer: Self-pay

## 2024-07-11 ENCOUNTER — Encounter (HOSPITAL_COMMUNITY): Payer: Self-pay | Admitting: Medical

## 2024-07-11 ENCOUNTER — Observation Stay (HOSPITAL_COMMUNITY)
Admission: RE | Admit: 2024-07-11 | Discharge: 2024-07-12 | Disposition: A | Discharge status: Home / Self Care | Attending: Orthopedic Surgery | Admitting: Orthopedic Surgery

## 2024-07-11 ENCOUNTER — Encounter (HOSPITAL_COMMUNITY): Payer: Self-pay | Admitting: Orthopedic Surgery

## 2024-07-11 DIAGNOSIS — Z79899 Other long term (current) drug therapy: Secondary | ICD-10-CM | POA: Insufficient documentation

## 2024-07-11 DIAGNOSIS — Z96652 Presence of left artificial knee joint: Secondary | ICD-10-CM | POA: Diagnosis not present

## 2024-07-11 DIAGNOSIS — M25561 Pain in right knee: Secondary | ICD-10-CM | POA: Diagnosis present

## 2024-07-11 DIAGNOSIS — Z7982 Long term (current) use of aspirin: Secondary | ICD-10-CM | POA: Insufficient documentation

## 2024-07-11 DIAGNOSIS — I10 Essential (primary) hypertension: Secondary | ICD-10-CM | POA: Diagnosis not present

## 2024-07-11 DIAGNOSIS — Z7984 Long term (current) use of oral hypoglycemic drugs: Secondary | ICD-10-CM | POA: Diagnosis not present

## 2024-07-11 DIAGNOSIS — M1711 Unilateral primary osteoarthritis, right knee: Secondary | ICD-10-CM

## 2024-07-11 DIAGNOSIS — E119 Type 2 diabetes mellitus without complications: Secondary | ICD-10-CM | POA: Insufficient documentation

## 2024-07-11 DIAGNOSIS — Z96651 Presence of right artificial knee joint: Principal | ICD-10-CM

## 2024-07-11 DIAGNOSIS — E785 Hyperlipidemia, unspecified: Secondary | ICD-10-CM

## 2024-07-11 DIAGNOSIS — G8918 Other acute postprocedural pain: Secondary | ICD-10-CM | POA: Diagnosis not present

## 2024-07-11 HISTORY — PX: TOTAL KNEE ARTHROPLASTY: SHX125

## 2024-07-11 LAB — GLUCOSE, CAPILLARY
Glucose-Capillary: 115 mg/dL — ABNORMAL HIGH (ref 70–99)
Glucose-Capillary: 146 mg/dL — ABNORMAL HIGH (ref 70–99)
Glucose-Capillary: 205 mg/dL — ABNORMAL HIGH (ref 70–99)
Glucose-Capillary: 216 mg/dL — ABNORMAL HIGH (ref 70–99)
Glucose-Capillary: 308 mg/dL — ABNORMAL HIGH (ref 70–99)

## 2024-07-11 SURGERY — ARTHROPLASTY, KNEE, TOTAL
Anesthesia: Monitor Anesthesia Care | Site: Knee | Laterality: Right

## 2024-07-11 MED ORDER — TRANEXAMIC ACID-NACL 1000-0.7 MG/100ML-% IV SOLN
1000.0000 mg | Freq: Once | INTRAVENOUS | Status: AC
  Administered 2024-07-11: 1000 mg via INTRAVENOUS
  Filled 2024-07-11: qty 100

## 2024-07-11 MED ORDER — ONDANSETRON HCL 4 MG/2ML IJ SOLN
INTRAMUSCULAR | Status: AC
  Filled 2024-07-11: qty 2

## 2024-07-11 MED ORDER — FENTANYL CITRATE (PF) 100 MCG/2ML IJ SOLN
INTRAMUSCULAR | Status: DC | PRN
  Administered 2024-07-11 (×2): 50 ug via INTRAVENOUS

## 2024-07-11 MED ORDER — STERILE WATER FOR IRRIGATION IR SOLN
Status: DC | PRN
  Administered 2024-07-11: 2000 mL

## 2024-07-11 MED ORDER — METHOCARBAMOL 500 MG PO TABS
500.0000 mg | ORAL_TABLET | Freq: Four times a day (QID) | ORAL | Status: DC | PRN
  Administered 2024-07-11: 500 mg via ORAL
  Filled 2024-07-11: qty 1

## 2024-07-11 MED ORDER — RALOXIFENE HCL 60 MG PO TABS
60.0000 mg | ORAL_TABLET | Freq: Every day | ORAL | Status: DC
  Administered 2024-07-12: 60 mg via ORAL
  Filled 2024-07-11 (×2): qty 1

## 2024-07-11 MED ORDER — ROPIVACAINE HCL 5 MG/ML IJ SOLN
INTRAMUSCULAR | Status: DC | PRN
  Administered 2024-07-11: 25 mL via PERINEURAL

## 2024-07-11 MED ORDER — BISACODYL 10 MG RE SUPP: 10.0000 mg | Freq: Every day | RECTAL | Status: DC | PRN

## 2024-07-11 MED ORDER — SODIUM CHLORIDE (PF) 0.9 % IJ SOLN
INTRAMUSCULAR | Status: DC | PRN
  Administered 2024-07-11: 61 mL

## 2024-07-11 MED ORDER — OXYCODONE HCL 5 MG PO TABS
5.0000 mg | ORAL_TABLET | ORAL | Status: DC | PRN
  Administered 2024-07-11: 5 mg via ORAL
  Filled 2024-07-11: qty 1

## 2024-07-11 MED ORDER — ACETAMINOPHEN 500 MG PO TABS
1000.0000 mg | ORAL_TABLET | Freq: Four times a day (QID) | ORAL | Status: DC
  Administered 2024-07-11 – 2024-07-12 (×5): 1000 mg via ORAL
  Filled 2024-07-11 (×5): qty 2

## 2024-07-11 MED ORDER — ONDANSETRON HCL 4 MG/2ML IJ SOLN: 4.0000 mg | Freq: Four times a day (QID) | INTRAMUSCULAR | Status: DC | PRN

## 2024-07-11 MED ORDER — CHLORHEXIDINE GLUCONATE 0.12 % MT SOLN
15.0000 mL | Freq: Once | OROMUCOSAL | Status: AC
  Administered 2024-07-11: 15 mL via OROMUCOSAL

## 2024-07-11 MED ORDER — FENTANYL CITRATE PF 50 MCG/ML IJ SOSY
PREFILLED_SYRINGE | INTRAMUSCULAR | Status: AC
Start: 2024-07-11 — End: 2024-07-11
  Filled 2024-07-11: qty 3

## 2024-07-11 MED ORDER — SODIUM CHLORIDE 0.9 % IR SOLN
Status: DC | PRN
  Administered 2024-07-11: 1000 mL

## 2024-07-11 MED ORDER — ALUM & MAG HYDROXIDE-SIMETH 200-200-20 MG/5ML PO SUSP: 30.0000 mL | ORAL | Status: DC | PRN

## 2024-07-11 MED ORDER — DEXAMETHASONE SODIUM PHOSPHATE 10 MG/ML IJ SOLN
10.0000 mg | Freq: Once | INTRAMUSCULAR | Status: AC
  Administered 2024-07-12: 10 mg via INTRAVENOUS
  Filled 2024-07-11: qty 1

## 2024-07-11 MED ORDER — OXYCODONE HCL 5 MG PO TABS: 5.0000 mg | ORAL_TABLET | Freq: Once | ORAL | Status: DC | PRN

## 2024-07-11 MED ORDER — METFORMIN HCL 500 MG PO TABS: 500.0000 mg | ORAL_TABLET | Freq: Every day | ORAL | Status: DC

## 2024-07-11 MED ORDER — FENTANYL CITRATE (PF) 100 MCG/2ML IJ SOLN
INTRAMUSCULAR | Status: AC
  Filled 2024-07-11: qty 2

## 2024-07-11 MED ORDER — PROPOFOL 1000 MG/100ML IV EMUL
INTRAVENOUS | Status: AC
  Filled 2024-07-11: qty 100

## 2024-07-11 MED ORDER — MENTHOL 3 MG MT LOZG: 1.0000 | LOZENGE | OROMUCOSAL | Status: DC | PRN

## 2024-07-11 MED ORDER — DEXAMETHASONE SODIUM PHOSPHATE 10 MG/ML IJ SOLN: 8.0000 mg | Freq: Once | INTRAMUSCULAR | Status: DC

## 2024-07-11 MED ORDER — INSULIN ASPART 100 UNIT/ML IJ SOLN
0.0000 [IU] | Freq: Three times a day (TID) | INTRAMUSCULAR | Status: DC
  Administered 2024-07-11 (×2): 5 [IU] via SUBCUTANEOUS
  Administered 2024-07-12: 3 [IU] via SUBCUTANEOUS

## 2024-07-11 MED ORDER — LOSARTAN POTASSIUM 25 MG PO TABS
25.0000 mg | ORAL_TABLET | Freq: Every day | ORAL | Status: DC
  Administered 2024-07-12: 25 mg via ORAL
  Filled 2024-07-11: qty 1

## 2024-07-11 MED ORDER — DEXAMETHASONE SODIUM PHOSPHATE 10 MG/ML IJ SOLN
INTRAMUSCULAR | Status: AC
  Filled 2024-07-11: qty 1

## 2024-07-11 MED ORDER — OXYCODONE HCL 5 MG/5ML PO SOLN: 5.0000 mg | Freq: Once | ORAL | Status: DC | PRN

## 2024-07-11 MED ORDER — METFORMIN HCL 500 MG PO TABS
1000.0000 mg | ORAL_TABLET | Freq: Every day | ORAL | Status: DC
  Administered 2024-07-12: 1000 mg via ORAL
  Filled 2024-07-11: qty 2

## 2024-07-11 MED ORDER — METOCLOPRAMIDE HCL 5 MG/ML IJ SOLN: 5.0000 mg | Freq: Three times a day (TID) | INTRAMUSCULAR | Status: DC | PRN

## 2024-07-11 MED ORDER — POLYETHYLENE GLYCOL 3350 17 G PO PACK
17.0000 g | PACK | Freq: Two times a day (BID) | ORAL | Status: DC
  Administered 2024-07-11 – 2024-07-12 (×2): 17 g via ORAL
  Filled 2024-07-11 (×2): qty 1

## 2024-07-11 MED ORDER — CEFAZOLIN SODIUM-DEXTROSE 2-4 GM/100ML-% IV SOLN
2.0000 g | Freq: Four times a day (QID) | INTRAVENOUS | Status: AC
  Administered 2024-07-11 (×2): 2 g via INTRAVENOUS
  Filled 2024-07-11 (×2): qty 100

## 2024-07-11 MED ORDER — POVIDONE-IODINE 10 % EX SWAB: 2.0000 | Freq: Once | CUTANEOUS | Status: DC

## 2024-07-11 MED ORDER — TRANEXAMIC ACID-NACL 1000-0.7 MG/100ML-% IV SOLN
1000.0000 mg | INTRAVENOUS | Status: AC
  Administered 2024-07-11: 1000 mg via INTRAVENOUS
  Filled 2024-07-11: qty 100

## 2024-07-11 MED ORDER — SIMVASTATIN 20 MG PO TABS
20.0000 mg | ORAL_TABLET | Freq: Every day | ORAL | Status: DC
  Administered 2024-07-11: 20 mg via ORAL
  Filled 2024-07-11: qty 1

## 2024-07-11 MED ORDER — KETOROLAC TROMETHAMINE 30 MG/ML IJ SOLN
INTRAMUSCULAR | Status: AC
  Filled 2024-07-11: qty 1

## 2024-07-11 MED ORDER — ONDANSETRON HCL 4 MG PO TABS: 4.0000 mg | ORAL_TABLET | Freq: Four times a day (QID) | ORAL | Status: DC | PRN

## 2024-07-11 MED ORDER — METHOCARBAMOL 1000 MG/10ML IJ SOLN: 500.0000 mg | Freq: Four times a day (QID) | INTRAMUSCULAR | Status: DC | PRN

## 2024-07-11 MED ORDER — PROPOFOL 500 MG/50ML IV EMUL
INTRAVENOUS | Status: DC | PRN
  Administered 2024-07-11: 120 ug/kg/min via INTRAVENOUS

## 2024-07-11 MED ORDER — FAMOTIDINE 20 MG PO TABS
20.0000 mg | ORAL_TABLET | Freq: Two times a day (BID) | ORAL | Status: DC
  Administered 2024-07-11 – 2024-07-12 (×2): 20 mg via ORAL
  Filled 2024-07-11 (×2): qty 1

## 2024-07-11 MED ORDER — METOCLOPRAMIDE HCL 5 MG PO TABS: 5.0000 mg | ORAL_TABLET | Freq: Three times a day (TID) | ORAL | Status: DC | PRN

## 2024-07-11 MED ORDER — CEFAZOLIN SODIUM-DEXTROSE 2-4 GM/100ML-% IV SOLN
2.0000 g | INTRAVENOUS | Status: AC
  Administered 2024-07-11: 2 g via INTRAVENOUS
  Filled 2024-07-11: qty 100

## 2024-07-11 MED ORDER — INSULIN ASPART 100 UNIT/ML IJ SOLN: 0.0000 [IU] | INTRAMUSCULAR | Status: DC | PRN

## 2024-07-11 MED ORDER — LACTATED RINGERS IV SOLN
INTRAVENOUS | Status: DC | PRN
Start: 2024-07-11 — End: 2024-07-11

## 2024-07-11 MED ORDER — MIDAZOLAM HCL 2 MG/2ML IJ SOLN
INTRAMUSCULAR | Status: AC
  Filled 2024-07-11: qty 2

## 2024-07-11 MED ORDER — LACTATED RINGERS IV SOLN: INTRAVENOUS | Status: DC

## 2024-07-11 MED ORDER — SODIUM CHLORIDE (PF) 0.9 % IJ SOLN
INTRAMUSCULAR | Status: AC
  Filled 2024-07-11: qty 30

## 2024-07-11 MED ORDER — FENTANYL CITRATE PF 50 MCG/ML IJ SOSY
25.0000 ug | PREFILLED_SYRINGE | INTRAMUSCULAR | Status: DC | PRN
  Administered 2024-07-11 (×3): 50 ug via INTRAVENOUS

## 2024-07-11 MED ORDER — KETOROLAC TROMETHAMINE 15 MG/ML IJ SOLN
7.5000 mg | Freq: Four times a day (QID) | INTRAMUSCULAR | Status: AC
  Administered 2024-07-11 – 2024-07-12 (×4): 7.5 mg via INTRAVENOUS
  Filled 2024-07-11 (×4): qty 1

## 2024-07-11 MED ORDER — ONDANSETRON HCL 4 MG/2ML IJ SOLN
INTRAMUSCULAR | Status: DC | PRN
  Administered 2024-07-11: 4 mg via INTRAVENOUS

## 2024-07-11 MED ORDER — BUPIVACAINE IN DEXTROSE 0.75-8.25 % IT SOLN
INTRATHECAL | Status: DC | PRN
  Administered 2024-07-11: 1.6 mL via INTRATHECAL

## 2024-07-11 MED ORDER — ASPIRIN 81 MG PO CHEW
81.0000 mg | CHEWABLE_TABLET | Freq: Two times a day (BID) | ORAL | Status: DC
  Administered 2024-07-11 – 2024-07-12 (×2): 81 mg via ORAL
  Filled 2024-07-11 (×2): qty 1

## 2024-07-11 MED ORDER — METHOCARBAMOL 500 MG PO TABS
ORAL_TABLET | ORAL | Status: AC
  Filled 2024-07-11: qty 1

## 2024-07-11 MED ORDER — SODIUM CHLORIDE 0.9 % IV SOLN: INTRAVENOUS | Status: DC

## 2024-07-11 MED ORDER — HYDROMORPHONE HCL 1 MG/ML IJ SOLN: 0.5000 mg | INTRAMUSCULAR | Status: DC | PRN

## 2024-07-11 MED ORDER — 0.9 % SODIUM CHLORIDE (POUR BTL) OPTIME
TOPICAL | Status: DC | PRN
Start: 2024-07-11 — End: 2024-07-11
  Administered 2024-07-11: 1000 mL

## 2024-07-11 MED ORDER — DEXAMETHASONE SODIUM PHOSPHATE 10 MG/ML IJ SOLN
INTRAMUSCULAR | Status: DC | PRN
  Administered 2024-07-11: 8 mg via INTRAVENOUS

## 2024-07-11 MED ORDER — OXYCODONE HCL 5 MG PO TABS
10.0000 mg | ORAL_TABLET | ORAL | Status: DC | PRN
  Administered 2024-07-12: 10 mg via ORAL
  Filled 2024-07-11: qty 2

## 2024-07-11 MED ORDER — PROPOFOL 10 MG/ML IV BOLUS
INTRAVENOUS | Status: AC
  Filled 2024-07-11: qty 20

## 2024-07-11 MED ORDER — PROPOFOL 10 MG/ML IV BOLUS
INTRAVENOUS | Status: DC | PRN
  Administered 2024-07-11: 20 mg via INTRAVENOUS

## 2024-07-11 MED ORDER — SENNA 8.6 MG PO TABS
2.0000 | ORAL_TABLET | Freq: Every day | ORAL | Status: DC
  Administered 2024-07-11: 17.2 mg via ORAL
  Filled 2024-07-11: qty 2

## 2024-07-11 MED ORDER — PHENOL 1.4 % MT LIQD
1.0000 | OROMUCOSAL | Status: DC | PRN
Start: 2024-07-11 — End: 2024-07-12

## 2024-07-11 MED ORDER — DIPHENHYDRAMINE HCL 12.5 MG/5ML PO ELIX: 12.5000 mg | ORAL_SOLUTION | ORAL | Status: DC | PRN

## 2024-07-11 MED ORDER — BUPIVACAINE-EPINEPHRINE (PF) 0.25% -1:200000 IJ SOLN
INTRAMUSCULAR | Status: AC
  Filled 2024-07-11: qty 30

## 2024-07-11 MED ORDER — MIDAZOLAM HCL 5 MG/5ML IJ SOLN
INTRAMUSCULAR | Status: DC | PRN
Start: 2024-07-11 — End: 2024-07-11
  Administered 2024-07-11 (×2): 1 mg via INTRAVENOUS

## 2024-07-11 SURGICAL SUPPLY — 48 items
ATTUNE MED ANAT PAT 35 KNEE (Knees) IMPLANT
BAG COUNTER SPONGE SURGICOUNT (BAG) IMPLANT
BAG ZIPLOCK 12X15 (MISCELLANEOUS) ×1 IMPLANT
BASEPLATE TIB CMT FB PCKT SZ4 (Stem) IMPLANT
BLADE SAW SGTL 11.0X1.19X90.0M (BLADE) IMPLANT
BLADE SAW SGTL 13.0X1.19X90.0M (BLADE) ×1 IMPLANT
BNDG ELASTIC 6INX 5YD STR LF (GAUZE/BANDAGES/DRESSINGS) ×1 IMPLANT
BOWL SMART MIX CTS (DISPOSABLE) ×1 IMPLANT
CEMENT HV SMART SET (Cement) ×2 IMPLANT
COMPONENT FEM CMT ATTUNE 3 RT (Joint) IMPLANT
COOLER ICEMAN CLASSIC (MISCELLANEOUS) IMPLANT
COVER SURGICAL LIGHT HANDLE (MISCELLANEOUS) ×1 IMPLANT
CUFF TRNQT CYL 34X4.125X (TOURNIQUET CUFF) ×1 IMPLANT
DERMABOND ADVANCED .7 DNX12 (GAUZE/BANDAGES/DRESSINGS) ×1 IMPLANT
DRAPE U-SHAPE 47X51 STRL (DRAPES) ×1 IMPLANT
DRESSING AQUACEL AG SP 3.5X10 (GAUZE/BANDAGES/DRESSINGS) ×1 IMPLANT
DURAPREP 26ML APPLICATOR (WOUND CARE) ×2 IMPLANT
ELECT REM PT RETURN 15FT ADLT (MISCELLANEOUS) ×1 IMPLANT
GLOVE BIO SURGEON STRL SZ 6 (GLOVE) ×1 IMPLANT
GLOVE BIOGEL PI IND STRL 6.5 (GLOVE) ×1 IMPLANT
GLOVE BIOGEL PI IND STRL 7.5 (GLOVE) ×1 IMPLANT
GLOVE ORTHO TXT STRL SZ7.5 (GLOVE) ×2 IMPLANT
GOWN STRL REUS W/ TWL LRG LVL3 (GOWN DISPOSABLE) ×2 IMPLANT
HOLDER FOLEY CATH W/STRAP (MISCELLANEOUS) IMPLANT
INSERT TIB CMT ATTUNE 3X6 RT (Insert) IMPLANT
KIT TURNOVER KIT A (KITS) ×1 IMPLANT
MANIFOLD NEPTUNE II (INSTRUMENTS) ×1 IMPLANT
NDL SAFETY ECLIPSE 18X1.5 (NEEDLE) IMPLANT
NS IRRIG 1000ML POUR BTL (IV SOLUTION) ×1 IMPLANT
PACK TOTAL KNEE CUSTOM (KITS) ×1 IMPLANT
PAD COLD SHLDR WRAP-ON (PAD) IMPLANT
PENCIL SMOKE EVACUATOR (MISCELLANEOUS) ×1 IMPLANT
PIN FIX SIGMA LCS THRD HI (PIN) IMPLANT
PROTECTOR NERVE ULNAR (MISCELLANEOUS) ×1 IMPLANT
SET HNDPC FAN SPRY TIP SCT (DISPOSABLE) ×1 IMPLANT
SET PAD KNEE POSITIONER (MISCELLANEOUS) ×1 IMPLANT
SPIKE FLUID TRANSFER (MISCELLANEOUS) ×2 IMPLANT
SUT MNCRL AB 4-0 PS2 18 (SUTURE) ×1 IMPLANT
SUT STRATAFIX PDS+ 0 24IN (SUTURE) ×1 IMPLANT
SUT VIC AB 1 CT1 36 (SUTURE) ×1 IMPLANT
SUT VIC AB 2-0 CT1 TAPERPNT 27 (SUTURE) ×2 IMPLANT
SYR 3ML LL SCALE MARK (SYRINGE) ×1 IMPLANT
TOWEL GREEN STERILE FF (TOWEL DISPOSABLE) ×1 IMPLANT
TRAY FOL W/BAG SLVR 16FR STRL (SET/KITS/TRAYS/PACK) IMPLANT
TRAY FOLEY MTR SLVR 16FR STAT (SET/KITS/TRAYS/PACK) ×1 IMPLANT
TUBE SUCTION HIGH CAP CLEAR NV (SUCTIONS) ×1 IMPLANT
WATER STERILE IRR 1000ML POUR (IV SOLUTION) ×2 IMPLANT
WRAP KNEE MAXI GEL POST OP (GAUZE/BANDAGES/DRESSINGS) ×1 IMPLANT

## 2024-07-11 NOTE — Evaluation (Signed)
 Physical Therapy Evaluation Patient Details Name: Epifania Littrell MRN: [id number] DOB: 1946/09/09 Today's Date: 07/11/2024  History of Present Illness  s/p RTKA 07/11/24, PMH; DM, vertigo, RCR  Clinical Impression  Pt admitted with above diagnosis. Patient  tolerated standing and stepping to recliner, rated 8/10 pain so limited activity and requested pain medication. Patient should progress to Dc  tomorrow if pain controlled. Pt currently with functional limitations due to the deficits listed below (see PT Problem List). Pt will benefit from acute skilled PT to increase their independence and safety with mobility to allow discharge.       Patient reports that she is to have HHPT x 2 weeks      If plan is discharge home, recommend the following: A little help with walking and/or transfers;A little help with bathing/dressing/bathroom   Can travel by private vehicle        Equipment Recommendations Rolling walker (2 wheels) (youth)  Recommendations for Other Services       Functional Status Assessment Patient has had a recent decline in their functional status and demonstrates the ability to make significant improvements in function in a reasonable and predictable amount of time.     Precautions / Restrictions Precautions Precautions: Knee;Fall Precaution Booklet Issued: Yes (comment) Restrictions Weight Bearing Restrictions Per Provider Order: No      Mobility  Bed Mobility Overal bed mobility: Needs Assistance Bed Mobility: Supine to Sit     Supine to sit: Min assist     General bed mobility comments: assist withn right  leg    Transfers Overall transfer level: Needs assistance Equipment used: Rolling walker (2 wheels) Transfers: Sit to/from Stand, Bed to chair/wheelchair/BSC Sit to Stand: Min assist   Step pivot transfers: Min assist       General transfer comment: Ceis for safety/ RW    Ambulation/Gait                  Stairs             Wheelchair Mobility     Tilt Bed    Modified Rankin (Stroke Patients Only)       Balance Overall balance assessment: Mild deficits observed, not formally tested                                           Pertinent Vitals/Pain Pain Assessment Pain Assessment: 0-10 Pain Score: 8  Pain Location: right knee Pain Descriptors / Indicators: Discomfort Pain Intervention(s): Ice applied, Monitored during session, Limited activity within patient's tolerance, Patient requesting pain meds-RN notified    Home Living Family/patient expects to be discharged to:: Private residence Living Arrangements: Spouse/significant other Available Help at Discharge: Family;Available 24 hours/day Type of Home: House Home Access: Stairs to enter   [employer] of Steps: 1 Alternate Level Stairs-Number of Steps: flight Home Layout: Two level;Bed/bath upstairs Home Equipment: None Additional Comments: plans to sleep in recliner, first floor    Prior Function Prior Level of Function : Independent/Modified Independent                     Extremity/Trunk Assessment   Upper Extremity Assessment Upper Extremity Assessment: Overall WFL for tasks assessed    Lower Extremity Assessment Lower Extremity Assessment: RLE deficits/detail RLE Deficits / Details: + SLR    Cervical / Trunk Assessment Cervical / Trunk Assessment: Normal  Communication  Communication Communication: No apparent difficulties    Cognition Arousal: Alert Behavior During Therapy: WFL for tasks assessed/performed   PT - Cognitive impairments: No apparent impairments                         Following commands: Intact       Cueing       General Comments      Exercises     Assessment/Plan    PT Assessment Patient needs continued PT services  PT Problem List Decreased strength;Decreased mobility;Decreased safety awareness;Decreased range of motion;Decreased  knowledge of precautions;Decreased activity tolerance;Decreased balance;Pain       PT Treatment Interventions DME instruction;Therapeutic activities;Gait training;Therapeutic exercise;Patient/family education;Stair training;Functional mobility training    PT Goals (Current goals can be found in the Care Plan section)  Acute Rehab PT Goals Patient Stated Goal: go home, HHPT PT Goal Formulation: With patient/family Time For Goal Achievement: 07/18/24 Potential to Achieve Goals: Good    Frequency 7X/week     Co-evaluation               AM-PAC PT 6 Clicks Mobility  Outcome Measure Help needed turning from your back to your side while in a flat bed without using bedrails?: A Little Help needed moving from lying on your back to sitting on the side of a flat bed without using bedrails?: A Little Help needed moving to and from a bed to a chair (including a wheelchair)?: A Little Help needed standing up from a chair using your arms (e.g., wheelchair or bedside chair)?: A Little Help needed to walk in hospital room?: A Little Help needed climbing 3-5 steps with a railing? : A Little 6 Click Score: 18    End of Session Equipment Utilized During Treatment: Gait belt Activity Tolerance: Patient tolerated treatment well Patient left: in chair;with call bell/phone within reach;with chair alarm set;with family/visitor present Nurse Communication: Mobility status PT Visit Diagnosis: Unsteadiness on feet (R26.81);Difficulty in walking, not elsewhere classified (R26.2);Pain    Time: 8443-8376 PT Time Calculation (min) (ACUTE ONLY): 27 min   Charges:   PT Evaluation $PT Eval Low Complexity: 1 Low PT Treatments $Gait Training: 8-22 mins PT General Charges $$ ACUTE PT VISIT: 1 Visit         Darice Potters PT Acute Rehabilitation Services Office [phone]   Potters Darice Norris 07/11/2024, 5:19 PM

## 2024-07-11 NOTE — Anesthesia Procedure Notes (Signed)
 Anesthesia Regional Block: Adductor canal block   Pre-Anesthetic Checklist: , timeout performed,  Correct Patient, Correct Site, Correct Laterality,  Correct Procedure, Correct Position, site marked,  Risks and benefits discussed,  Surgical consent,  Pre-op evaluation,  At surgeon's request and post-op pain management  Laterality: Right  Prep: chloraprep       Needles:  Injection technique: Single-shot  Needle Type: Echogenic Needle     Needle Length: 9cm  Needle Gauge: 21     Additional Needles:   Narrative:  Start time: 07/11/2024 6:56 AM End time: 07/11/2024 7:02 AM Injection made incrementally with aspirations every 5 mL.  Performed by: Personally  Anesthesiologist: Maryclare Cornet, MD  Additional Notes: Pt tolerated the procedure well.

## 2024-07-11 NOTE — Plan of Care (Signed)
   Problem: Activity: Goal: Risk for activity intolerance will decrease Outcome: Progressing   Problem: Nutrition: Goal: Adequate nutrition will be maintained Outcome: Progressing

## 2024-07-11 NOTE — Anesthesia Procedure Notes (Signed)
 Spinal  Patient location during procedure: OR Start time: 07/11/2024 7:22 AM End time: 07/11/2024 7:24 AM Reason for block: surgical anesthesia Staffing Performed: anesthesiologist  Anesthesiologist: Maryclare Cornet, MD Performed by: Maryclare Cornet, MD Authorized by: Maryclare Cornet, MD   Preanesthetic Checklist Completed: patient identified, IV checked, risks and benefits discussed, surgical consent, monitors and equipment checked, pre-op evaluation and timeout performed Spinal Block Patient position: sitting Prep: DuraPrep Patient monitoring: cardiac monitor, continuous pulse ox and blood pressure Approach: midline Location: L3-4 Injection technique: single-shot Needle Needle type: Pencan  Needle gauge: 24 G Needle length: 9 cm Assessment Sensory level: T10 Events: CSF return Additional Notes Functioning IV was confirmed and monitors were applied. Sterile prep and drape, including hand hygiene and sterile gloves were used. The patient was positioned and the spine was prepped. The skin was anesthetized with lidocaine.  Free flow of clear CSF was obtained prior to injecting local anesthetic into the CSF.  The spinal needle aspirated freely following injection.  The needle was carefully withdrawn.  The patient tolerated the procedure well.

## 2024-07-11 NOTE — Interval H&P Note (Signed)
 History and Physical Interval Note:  07/11/2024 7:07 AM  Natalie Fowler  has presented today for surgery, with the diagnosis of Right knee osteoarthritis.  The various methods of treatment have been discussed with the patient and family. After consideration of risks, benefits and other options for treatment, the patient has consented to  Procedure(s): ARTHROPLASTY, KNEE, TOTAL (Right) as a surgical intervention.  The patient's history has been reviewed, patient examined, no change in status, stable for surgery.  I have reviewed the patient's chart and labs.  Questions were answered to the patient's satisfaction.     [name]

## 2024-07-11 NOTE — Op Note (Signed)
 NAME:  Natalie Fowler                      MEDICAL RECORD NO.:  [id number]                             FACILITY:  [facility]      PHYSICIAN:  Natalie Fowler, M.D.  DATE OF BIRTH:  [birth year]      DATE OF PROCEDURE:  07/11/2024                                     OPERATIVE REPORT         PREOPERATIVE DIAGNOSIS:  Right knee osteoarthritis.      POSTOPERATIVE DIAGNOSIS:  Right knee osteoarthritis.      FINDINGS:  The patient was noted to have complete loss of cartilage and   bone-on-bone arthritis with associated osteophytes in the medial and patellofemoral compartments of   the knee with a significant synovitis and associated effusion.  The patient had failed months of conservative treatment including medications, injection therapy, activity modification.     PROCEDURE:  Right total knee replacement.      COMPONENTS USED:  DePuy Attune FB CR MS knee   system, a size 3 femur, 4 tibia, size 6 mm CR MS AOX insert, and 35 anatomic patellar   button.      SURGEON:  Natalie Fowler, M.D.      ASSISTANT:  Rosina Calin, PA-C.      ANESTHESIA:  Regional and Spinal.      SPECIMENS:  None.      COMPLICATION:  None.      DRAINS:  None.  EBL: <100 cc      TOURNIQUET TIME:  27 min at 225 mmHg     The patient was stable to the recovery room.      INDICATION FOR PROCEDURE:  [name] is a 78 y.o. female patient of   mine.  The patient had been seen, evaluated, and treated for months conservatively in the   office with medication, activity modification, and injections.  The patient had   radiographic changes of bone-on-bone arthritis with endplate sclerosis and osteophytes noted.  Based on the radiographic changes and failed conservative measures, the patient   decided to proceed with definitive treatment, total knee replacement.  Risks of infection, DVT, component failure, need for revision surgery, neurovascular injury were reviewed in the office setting.  The postop course was  reviewed stressing the efforts to maximize post-operative satisfaction and function.  Consent was obtained for benefit of pain   relief.      PROCEDURE IN DETAIL:  The patient was brought to the operative theater.   Once adequate anesthesia, preoperative antibiotics, 2 gm of Ancef ,1 gm of Tranexamic Acid , and 10 mg of Decadron  administered, the patient was positioned supine with a right thigh tourniquet placed.  The  right lower extremity was prepped and draped in sterile fashion.  A time-   out was performed identifying the patient, planned procedure, and the appropriate extremity.      The right lower extremity was placed in the [facility] leg holder.  The leg was   exsanguinated, tourniquet elevated to 225 mmHg.  A midline incision was   made followed by median parapatellar arthrotomy.  Following initial   exposure, attention was first directed  to the patella.  Precut   measurement was noted to be 24 mm.  I resected down to 14 mm and used a   35 anatomic patellar button to restore patellar height as well as cover the cut surface.      The lug holes were drilled and a metal shim was placed to protect the   patella from retractors and saw blade during the procedure.      At this point, attention was now directed to the femur.  The femoral   canal was opened with a drill, irrigated to try to prevent fat emboli.  An   intramedullary rod was passed at 3 degrees valgus, 9 mm of bone was   resected off the distal femur.  Following this resection, the tibia was   subluxated anteriorly.  Using the extramedullary guide, 2 mm of bone was resected off   the proximal medial tibia.  We confirmed the gap would be   stable medially and laterally with a size 5 spacer block as well as confirmed that the tibial cut was perpendicular in the coronal plane, checking with an alignment rod.      Once this was done, I sized the femur to be a size 3 in the anterior-   posterior dimension, chose a standard component  based on medial and   lateral dimension.  The size 3 rotation block was then pinned in   position anterior referenced using the C-clamp to set rotation.  The   anterior, posterior, and  chamfer cuts were made without difficulty nor   notching making certain that I was along the anterior cortex to help   with flexion gap stability.      The final femoral shim cut was made off the lateral aspect of distal femur.      At this point, the tibia was sized to be a size 4.  The size 4 tray was   then pinned in position through the medial third of the tubercle,   drilled, and keel punched.  Trial reduction was now carried with a 3 femur,  4 tibia, a size 6 mm CR MS insert, and the 35 anatomic patella botton.  The knee was brought to full extension with good flexion stability with the patella   tracking through the trochlea without application of pressure.  Given   all these findings the trial components removed.  Final components were   opened and cement was mixed.  The knee was irrigated with normal saline solution and pulse lavage.  The synovial lining was   then injected with 30 cc of 0.25% Marcaine  with epinephrine , 1 cc of Toradol  and 30 cc of NS for a total of 61 cc.     Final implants were then cemented onto cleaned and dried cut surfaces of bone with the knee brought to extension with a size 6 mm CR MS trial insert.      Once the cement had fully cured, excess cement was removed   throughout the knee.  I confirmed that I was satisfied with the range of   motion and stability, and the final size 6 mm CR MS AOX insert was chosen.  It was   placed into the knee.      The tourniquet had been let down at 27 minutes.  No significant   hemostasis was required.  The extensor mechanism was then reapproximated using #1 Vicryl and #1 Stratafix sutures with the knee   in flexion.  The   remaining wound was closed with 2-0 Vicryl and running 4-0 Monocryl.   The knee was cleaned, dried, dressed  sterilely using Dermabond and   Aquacel dressing.  The patient was then   brought to recovery room in stable condition, tolerating the procedure   well.   Please note that Physician Assistant, Rosina Soho, PA-C was present for the entirety of the case, and was utilized for pre-operative positioning, peri-operative retractor management, general facilitation of the procedure and for primary wound closure at the end of the case.              Natalie CORDOBA Fowler, M.D.    07/11/2024 7:20 AM

## 2024-07-11 NOTE — Discharge Instructions (Signed)

## 2024-07-11 NOTE — Anesthesia Postprocedure Evaluation (Signed)
 Anesthesia Post Note  Patient: Natalie Fowler  Procedure(s) Performed: ARTHROPLASTY, KNEE, TOTAL (Right: Knee)     Patient location during evaluation: PACU Anesthesia Type: MAC, Spinal and Regional Level of consciousness: oriented and awake and alert Pain management: pain level controlled Vital Signs Assessment: post-procedure vital signs reviewed and stable Respiratory status: spontaneous breathing, respiratory function stable and patient connected to nasal cannula oxygen Cardiovascular status: blood pressure returned to baseline and stable Postop Assessment: no headache, no backache and no apparent nausea or vomiting Anesthetic complications: no   No notable events documented.  Last Vitals:  Vitals:   07/11/24 1033 07/11/24 1303  BP: 126/73 137/89  Pulse: 80 88  Resp: 18 18  Temp: 36.6 C   SpO2: 96% 95%    Last Pain:  Vitals:   07/11/24 1158  TempSrc:   PainSc: 7                  Shashwat Cleary S

## 2024-07-11 NOTE — Transfer of Care (Signed)
 Immediate Anesthesia Transfer of Care Note  Patient: Natalie Fowler  Procedure(s) Performed: ARTHROPLASTY, KNEE, TOTAL (Right: Knee)  Patient Location: PACU  Anesthesia Type:MAC, Regional, and Spinal  Level of Consciousness: drowsy  Airway & Oxygen Therapy: Patient Spontanous Breathing and Patient connected to face mask oxygen  Post-op Assessment: Report given to RN and Post -op Vital signs reviewed and stable  Post vital signs: Reviewed and stable  Last Vitals:  Vitals Value Taken Time  BP 116/62 07/11/24 08:52  Temp    Pulse 93 07/11/24 08:53  Resp 14 07/11/24 08:53  SpO2 100 % 07/11/24 08:53  Vitals shown include unfiled device data.  Last Pain:  Vitals:   07/11/24 0556  TempSrc:   PainSc: 0-No pain         Complications: No notable events documented.

## 2024-07-11 NOTE — Care Plan (Signed)
 Ortho Bundle Case Management Note  Patient Details  Name: Natalie Fowler MRN: [id number] Date of Birth: 1946/10/04  RT TKA on 07/11/24  DCP: Home with daughter DME: RW, ordered through [facility] PT: HHPT x2wks ([facility]) then [facility] PT                   DME Arranged:  Vannie rolling DME Agency:  [employer]  HH Arranged:    HH Agency:     Additional Comments: Please contact me with any questions of if this plan should need to change.  Burnard Dross, Case Manager [facility] [phone]  Ext. [phone]   07/11/2024, 8:14 AM

## 2024-07-12 ENCOUNTER — Other Ambulatory Visit (HOSPITAL_COMMUNITY): Payer: Self-pay

## 2024-07-12 ENCOUNTER — Encounter (HOSPITAL_COMMUNITY): Payer: Self-pay | Admitting: Orthopedic Surgery

## 2024-07-12 DIAGNOSIS — M1711 Unilateral primary osteoarthritis, right knee: Secondary | ICD-10-CM | POA: Diagnosis not present

## 2024-07-12 LAB — GLUCOSE, CAPILLARY
Glucose-Capillary: 118 mg/dL — ABNORMAL HIGH (ref 70–99)
Glucose-Capillary: 168 mg/dL — ABNORMAL HIGH (ref 70–99)

## 2024-07-12 LAB — BASIC METABOLIC PANEL WITH GFR
Anion gap: 10 (ref 5–15)
BUN: 18 mg/dL (ref 8–23)
CO2: 23 mmol/L (ref 22–32)
Calcium: 8.7 mg/dL — ABNORMAL LOW (ref 8.9–10.3)
Chloride: 103 mmol/L (ref 98–111)
Creatinine, Ser: 0.99 mg/dL (ref 0.44–1.00)
GFR, Estimated: 59 mL/min — ABNORMAL LOW (ref >60)
Glucose, Bld: 161 mg/dL — ABNORMAL HIGH (ref 70–99)
Potassium: 4.4 mmol/L (ref 3.5–5.1)
Sodium: 136 mmol/L (ref 135–145)

## 2024-07-12 LAB — CBC
HCT: 32.4 % — ABNORMAL LOW (ref 36.0–46.0)
Hemoglobin: 10.7 g/dL — ABNORMAL LOW (ref 12.0–15.0)
MCH: 29.7 pg (ref 26.0–34.0)
MCHC: 33 g/dL (ref 30.0–36.0)
MCV: 90 fL (ref 80.0–100.0)
Platelets: 167 K/uL (ref 150–400)
RBC: 3.6 MIL/uL — ABNORMAL LOW (ref 3.87–5.11)
RDW: 12.6 % (ref 11.5–15.5)
WBC: 12.8 K/uL — ABNORMAL HIGH (ref 4.0–10.5)
nRBC: 0 % (ref 0.0–0.2)

## 2024-07-12 MED ORDER — OXYCODONE HCL 5 MG PO TABS
5.0000 mg | ORAL_TABLET | ORAL | 0 refills | Status: AC | PRN
  Filled 2024-07-12: qty 42, 4d supply, fill #0

## 2024-07-12 MED ORDER — METHOCARBAMOL 500 MG PO TABS
500.0000 mg | ORAL_TABLET | Freq: Four times a day (QID) | ORAL | 0 refills | Status: AC | PRN
  Filled 2024-07-12: qty 40, 10d supply, fill #0

## 2024-07-12 MED ORDER — KETOROLAC TROMETHAMINE 15 MG/ML IJ SOLN: 7.5000 mg | Freq: Four times a day (QID) | INTRAMUSCULAR | Status: DC

## 2024-07-12 MED ORDER — KETOROLAC TROMETHAMINE 15 MG/ML IJ SOLN
7.5000 mg | Freq: Four times a day (QID) | INTRAMUSCULAR | Status: AC
  Administered 2024-07-12: 7.5 mg via INTRAVENOUS
  Filled 2024-07-12: qty 1

## 2024-07-12 MED ORDER — SENNA 8.6 MG PO TABS
2.0000 | ORAL_TABLET | Freq: Every day | ORAL | 0 refills | Status: AC
  Filled 2024-07-12: qty 28, 14d supply, fill #0

## 2024-07-12 MED ORDER — ONDANSETRON HCL 4 MG PO TABS
4.0000 mg | ORAL_TABLET | Freq: Four times a day (QID) | ORAL | 0 refills | Status: AC | PRN
  Filled 2024-07-12: qty 20, 5d supply, fill #0

## 2024-07-12 MED ORDER — POLYETHYLENE GLYCOL 3350 17 GM/SCOOP PO POWD
17.0000 g | Freq: Two times a day (BID) | ORAL | 0 refills | Status: AC
  Filled 2024-07-12: qty 238, 7d supply, fill #0

## 2024-07-12 MED ORDER — ASPIRIN 81 MG PO CHEW
81.0000 mg | CHEWABLE_TABLET | Freq: Two times a day (BID) | ORAL | 0 refills | Status: AC
  Filled 2024-07-12: qty 56, 28d supply, fill #0

## 2024-07-12 NOTE — Progress Notes (Signed)
 Physical Therapy Treatment Patient Details Name: Natalie Fowler MRN: [id number] DOB: Jul 28, 1946 Today's Date: 07/12/2024   History of Present Illness s/p RTKA 07/11/24, PMH; DM, vertigo, RCR    PT Comments  POD # 1 am session PT - Cognition Comments: Pt AxO x 3 knowledgable from prior TKR 2 years ago. Assisted OOB to amb a functional distance then practice ONE step she has to enter her home. Then returned to room to perform some TE's following HEP handout.  Instructed on proper tech, freq as well as use of ICE.   Addressed all mobility questions, discussed appropriate activity, educated on use of ICE.  Pt ready for D/C to home with Spouse.    If plan is discharge home, recommend the following: A little help with walking and/or transfers;A little help with bathing/dressing/bathroom   Can travel by private vehicle        Equipment Recommendations  Rolling walker (2 wheels)    Recommendations for Other Services       Precautions / Restrictions Precautions Precautions: Knee;Fall Precaution/Restrictions Comments: no pillow under knee Restrictions Weight Bearing Restrictions Per Provider Order: No     Mobility  Bed Mobility Overal bed mobility: Needs Assistance             General bed mobility comments: demonstarted and instructed on how to use a belt to self assist LE.    Transfers Overall transfer level: Needs assistance Equipment used: Rolling walker (2 wheels) Transfers: Sit to/from Stand Sit to Stand: Supervision           General transfer comment: one VC on proper hand placement and safety with turns    Ambulation/Gait Ambulation/Gait assistance: Supervision, Contact guard assist Gait Distance (Feet): 85 Feet Assistive device: Rolling walker (2 wheels) Gait Pattern/deviations: Step-to pattern, Step-through pattern Gait velocity: decreased     General Gait Details: Pt tolerated a functional distance with minimal pain at a short step through  gait.   Stairs Stairs: Yes Stairs assistance: Supervision, Contact guard assist Stair Management: No rails, Step to pattern, Forwards, With walker Number of Stairs: 1 General stair comments: practiced ONE step twice forward with walker and 25% VC's on proper tech.  Able to self recall proper tech her second attempt.   Wheelchair Mobility     Tilt Bed    Modified Rankin (Stroke Patients Only)       Balance                                            Communication Communication Communication: No apparent difficulties  Cognition Arousal: Alert Behavior During Therapy: WFL for tasks assessed/performed   PT - Cognitive impairments: No apparent impairments                       PT - Cognition Comments: Pt AxO x 3 knowledgable from prior TKR 2 years ago. Following commands: Intact      Cueing Cueing Techniques: Verbal cues  Exercises      General Comments        Pertinent Vitals/Pain Pain Assessment Pain Assessment: 0-10 Pain Score: 4  Pain Location: right knee Pain Descriptors / Indicators: Discomfort, Operative site guarding Pain Intervention(s): Repositioned, Monitored during session, Premedicated before session, Ice applied    Home Living  Prior Function            PT Goals (current goals can now be found in the care plan section) Progress towards PT goals: Progressing toward goals    Frequency    7X/week      PT Plan      Co-evaluation              AM-PAC PT 6 Clicks Mobility   Outcome Measure  Help needed turning from your back to your side while in a flat bed without using bedrails?: A Little Help needed moving from lying on your back to sitting on the side of a flat bed without using bedrails?: A Little Help needed moving to and from a bed to a chair (including a wheelchair)?: A Little Help needed standing up from a chair using your arms (e.g., wheelchair or bedside  chair)?: A Little Help needed to walk in hospital room?: A Little Help needed climbing 3-5 steps with a railing? : A Little 6 Click Score: 18    End of Session Equipment Utilized During Treatment: Gait belt Activity Tolerance: Patient tolerated treatment well Patient left: in chair;with call bell/phone within reach;with chair alarm set;with family/visitor present Nurse Communication: Mobility status PT Visit Diagnosis: Unsteadiness on feet (R26.81);Difficulty in walking, not elsewhere classified (R26.2);Pain     Time: 8895-8861 PT Time Calculation (min) (ACUTE ONLY): 34 min  Charges:    $Gait Training: 8-22 mins $Therapeutic Exercise: 8-22 mins PT General Charges $$ ACUTE PT VISIT: 1 Visit                     Katheryn Leap  PTA Acute  Rehabilitation Services Office M-F          [phone]

## 2024-07-12 NOTE — TOC Transition Note (Signed)
 Transition of Care [facility]) - Discharge Note   Patient Details  Name: Natalie Fowler MRN: [id number] Date of Birth: 02-06-1946  [facility] CM/SW Contact:  NORMAN ASPEN, LCSW Phone Number: 07/12/2024, 11:04 AM   Clinical Narrative:     Met with pt and confirming HHPT prearranged with [facility] via ortho MD office prior to surgery.  Per DME review, pt received RW within the past 5 yrs.  Discussed with pt and daughter and they do not want to pay privately for another RW.  Daughter believes she can find the original walker in attic.  No further TOC needs.  Final next level of care: Home w Home Health Services Barriers to Discharge: No Barriers Identified   Patient Goals and CMS Choice Patient states their goals for this hospitalization and ongoing recovery are:: return home          Discharge Placement                       Discharge Plan and Services Additional resources added to the After Visit Summary for                  DME Arranged: N/A DME Agency: NA       HH Arranged: PT HH Agency: [facility]        Social Drivers of Health (SDOH) Interventions SDOH Screenings   Food Insecurity: No Food Insecurity (07/11/2024)  Housing: Low Risk  (07/11/2024)  Transportation Needs: No Transportation Needs (07/11/2024)  Utilities: Not At Risk (07/11/2024)  Alcohol Screen: Low Risk  (09/17/2023)  Depression (PHQ2-9): Low Risk  (09/27/2023)  Financial Resource Strain: Low Risk  (09/17/2023)  Physical Activity: Sufficiently Active (09/17/2023)  Social Connections: Moderately Integrated (07/11/2024)  Stress: No Stress Concern Present (09/17/2023)  Tobacco Use: Low Risk  (07/11/2024)  Health Literacy: Adequate Health Literacy (09/17/2023)     Readmission Risk Interventions     No data to display

## 2024-07-12 NOTE — Care Management Obs Status (Signed)
 MEDICARE OBSERVATION STATUS NOTIFICATION   Patient Details  Name: Natalie Fowler MRN: [id number] Date of Birth: 1946/09/11   Medicare Observation Status Notification Given:  Yes Pt declined to sign form.   Alyce Inscore, LCSW 07/12/2024, 11:33 AM

## 2024-07-12 NOTE — Progress Notes (Signed)
   Subjective: 1 Day Post-Op Procedure(s) (LRB): ARTHROPLASTY, KNEE, TOTAL (Right) Patient reports pain as mild.   Patient seen in rounds for Dr. Ernie. Patient is well, and has had no acute complaints or problems. No acute events overnight. Foley catheter removed. Patient ambulated a few feet with PT.  We will start therapy today.   Objective: Vital signs in last 24 hours: Temp:  [97.8 F (36.6 C)-98.3 F (36.8 C)] 97.9 F (36.6 C) (08/20 0619) Pulse Rate:  [74-96] 82 (08/20 0619) Resp:  [12-19] 17 (08/20 0619) BP: (116-139)/(61-97) 139/69 (08/20 0619) SpO2:  [93 %-100 %] 97 % (08/20 0619)  Intake/Output from previous day:  Intake/Output Summary (Last 24 hours) at 07/12/2024 0819 Last data filed at 07/12/2024 0601 Gross per 24 hour  Intake 2500.51 ml  Output 2450 ml  Net 50.51 ml     Intake/Output this shift: No intake/output data recorded.  Labs: Recent Labs    07/12/24 0316  HGB 10.7*   Recent Labs    07/12/24 0316  WBC 12.8*  RBC 3.60*  HCT 32.4*  PLT 167   Recent Labs    07/12/24 0316  NA 136  K 4.4  CL 103  CO2 23  BUN 18  CREATININE 0.99  GLUCOSE 161*  CALCIUM 8.7*   No results for input(s): LABPT, INR in the last 72 hours.  Exam: General - Patient is Alert and Oriented Extremity - Neurologically intact Sensation intact distally Intact pulses distally Dorsiflexion/Plantar flexion intact Dressing - dressing C/D/I Motor Function - intact, moving foot and toes well on exam.   Past Medical History:  Diagnosis Date   Acid reflux    Arthritis    Brenner tumor    benign   Diabetes mellitus without complication (HCC)    Hypertension    Osteopenia    Ovarian cyst, bilateral    Pneumonia    PONV (postoperative nausea and vomiting)    Vertigo    Vitamin D  deficiency     Assessment/Plan: 1 Day Post-Op Procedure(s) (LRB): ARTHROPLASTY, KNEE, TOTAL (Right) Principal Problem:   S/P total knee arthroplasty, right  Estimated body mass  index is 31.44 kg/m as calculated from the following:   Height as of this encounter: 5' (1.524 m).   Weight as of this encounter: 73 kg. Advance diet Up with therapy D/C IV fluids   Patient's anticipated LOS is less than 2 midnights, meeting these requirements: - Younger than 52 - Lives within 1 hour of care - Has a competent adult at home to recover with post-op recover - NO history of  - Chronic pain requiring opiods  - Diabetes  - Coronary Artery Disease  - Heart failure  - Heart attack  - Stroke  - DVT/VTE  - Cardiac arrhythmia  - Respiratory Failure/COPD  - Renal failure  - Anemia  - Advanced Liver disease     DVT Prophylaxis - Aspirin  Weight bearing as tolerated.  Hgb stable at 10.7 this AM  Will give toradol  today for pain control   Plan is to go Home after hospital stay. Plan for discharge today following 1-2 sessions of PT as long as they are meeting their goals. Patient is scheduled for OPPT. Follow up in the office in 2 weeks.   Rosina Calin, PA-C Orthopedic Surgery [phone] 07/12/2024, 8:19 AM

## 2024-07-12 NOTE — Progress Notes (Signed)
 Discharge instructions given to patient and daughter, questions asked and answered . Discharge mediations delivered to patient at bedside D [facility]

## 2024-07-13 DIAGNOSIS — Z96653 Presence of artificial knee joint, bilateral: Secondary | ICD-10-CM | POA: Diagnosis not present

## 2024-07-13 DIAGNOSIS — E559 Vitamin D deficiency, unspecified: Secondary | ICD-10-CM | POA: Diagnosis not present

## 2024-07-13 DIAGNOSIS — Z79810 Long term (current) use of selective estrogen receptor modulators (SERMs): Secondary | ICD-10-CM | POA: Diagnosis not present

## 2024-07-13 DIAGNOSIS — N952 Postmenopausal atrophic vaginitis: Secondary | ICD-10-CM | POA: Diagnosis not present

## 2024-07-13 DIAGNOSIS — Z8701 Personal history of pneumonia (recurrent): Secondary | ICD-10-CM | POA: Diagnosis not present

## 2024-07-13 DIAGNOSIS — K219 Gastro-esophageal reflux disease without esophagitis: Secondary | ICD-10-CM | POA: Diagnosis not present

## 2024-07-13 DIAGNOSIS — Z9049 Acquired absence of other specified parts of digestive tract: Secondary | ICD-10-CM | POA: Diagnosis not present

## 2024-07-13 DIAGNOSIS — E119 Type 2 diabetes mellitus without complications: Secondary | ICD-10-CM | POA: Diagnosis not present

## 2024-07-13 DIAGNOSIS — I10 Essential (primary) hypertension: Secondary | ICD-10-CM | POA: Diagnosis not present

## 2024-07-13 DIAGNOSIS — E785 Hyperlipidemia, unspecified: Secondary | ICD-10-CM | POA: Diagnosis not present

## 2024-07-13 DIAGNOSIS — M8580 Other specified disorders of bone density and structure, unspecified site: Secondary | ICD-10-CM | POA: Diagnosis not present

## 2024-07-13 DIAGNOSIS — Z471 Aftercare following joint replacement surgery: Secondary | ICD-10-CM | POA: Diagnosis not present

## 2024-07-13 DIAGNOSIS — Z860100 Personal history of colon polyps, unspecified: Secondary | ICD-10-CM | POA: Diagnosis not present

## 2024-07-13 DIAGNOSIS — Z7984 Long term (current) use of oral hypoglycemic drugs: Secondary | ICD-10-CM | POA: Diagnosis not present

## 2024-07-18 NOTE — Discharge Summary (Signed)
 Patient ID: Natalie Fowler MRN: [id number] DOB/AGE: 1946/08/12 78 y.o.  Admit date: 07/11/2024 Discharge date: 07/12/2024  Admission Diagnoses:  Right knee osteoarthritis  Discharge Diagnoses:  Principal Problem:   S/P total knee arthroplasty, right   Past Medical History:  Diagnosis Date   Acid reflux    Arthritis    Harrold tumor    benign   Diabetes mellitus without complication (HCC)    Hypertension    Osteopenia    Ovarian cyst, bilateral    Pneumonia    PONV (postoperative nausea and vomiting)    Vertigo    Vitamin D  deficiency     Surgeries: Procedure(s): ARTHROPLASTY, KNEE, TOTAL on 07/11/2024   Consultants:   Discharged Condition: Improved  Hospital Course: Natalie Fowler is an 78 y.o. female who was admitted 07/11/2024 for operative treatment ofS/P total knee arthroplasty, right. Patient has severe unremitting pain that affects sleep, daily activities, and work/hobbies. After pre-op clearance the patient was taken to the operating room on 07/11/2024 and underwent  Procedure(s): ARTHROPLASTY, KNEE, TOTAL.    Patient was given perioperative antibiotics:  Anti-infectives (From admission, onward)    Start     Dose/Rate Route Frequency Ordered Stop   07/11/24 1300  ceFAZolin  (ANCEF ) IVPB 2g/100 mL premix        2 g 200 mL/hr over 30 Minutes Intravenous Every 6 hours 07/11/24 1032 07/12/24 0730   07/11/24 0715  ceFAZolin  (ANCEF ) IVPB 2g/100 mL premix        2 g 200 mL/hr over 30 Minutes Intravenous On call to O.R. 07/11/24 [id number] 07/11/24 0717        Patient was given sequential compression devices, early ambulation, and chemoprophylaxis to prevent DVT. Patient worked with PT and was meeting their goals regarding safe ambulation and transfers.  Patient benefited maximally from hospital stay and there were no complications.    Recent vital signs: No data found.   Recent laboratory studies: No results for input(s): WBC, HGB, HCT, PLT, NA, K, CL,  CO2, BUN, CREATININE, GLUCOSE, INR, CALCIUM in the last 72 hours.  Invalid input(s): PT, 2   Discharge Medications:   Allergies as of 07/12/2024       Reactions   Pantoprazole  Cough   Latex Rash   Vioxx [rofecoxib] Rash        Medication List     TAKE these medications    acetaminophen  500 MG tablet Commonly known as: TYLENOL  Take 500-1,000 mg by mouth every 6 (six) hours as needed (pain.).   Aspirin  Low Dose 81 MG chewable tablet Generic drug: aspirin  Chew 1 tablet (81 mg total) by mouth 2 (two) times daily for 28 days.   famotidine  20 MG tablet Commonly known as: PEPCID  Take 20 mg by mouth 2 (two) times daily.   losartan  25 MG tablet Commonly known as: COZAAR  Take 1 tablet (25 mg total) by mouth daily.   metFORMIN  1000 MG tablet Commonly known as: GLUCOPHAGE  Take 1 tablet (1,000 mg total) by mouth 2 (two) times daily with a meal. What changed:  how much to take when to take this additional instructions   methocarbamol  500 MG tablet Commonly known as: ROBAXIN  Take 1 tablet (500 mg total) by mouth every 6 (six) hours as needed for muscle spasms.   ondansetron  4 MG tablet Commonly known as: ZOFRAN  Take 1 tablet (4 mg total) by mouth every 6 (six) hours as needed for nausea.   OneTouch Delica Plus Lancet33G Misc Use to check sugar once a day.  Dx  Code: E11.9   OneTouch Verio Flex System w/Device Kit Use to check blood pressure once a day.  Dx code: E11.9   OneTouch Verio test strip Generic drug: glucose blood Use to check sugar once a day.  Dx code: E11.9   oxyCODONE  5 MG immediate release tablet Commonly known as: Oxy IR/ROXICODONE  Take 1-2 tablets (5-10 mg total) by mouth every 4 (four) hours as needed for severe pain (pain score 7-10).   polyethylene glycol powder 17 GM/SCOOP powder Commonly known as: GLYCOLAX /MIRALAX  Take 17 g by mouth 2 (two) times daily.   raloxifene  60 MG tablet Commonly known as: EVISTA  Take 1 tablet (60 mg  total) by mouth daily.   senna 8.6 MG Tabs tablet Commonly known as: SENOKOT Take 2 tablets (17.2 mg total) by mouth at bedtime for 14 days.   simvastatin  20 MG tablet Commonly known as: ZOCOR  Take 1 tablet (20 mg total) by mouth at bedtime.   Vitamin D  50 MCG (2000 UT) tablet Take 2,000 Units by mouth in the morning.               Discharge Care Instructions  (From admission, onward)           Start     Ordered   07/12/24 0000  Change dressing       Comments: Maintain surgical dressing until follow up in the clinic. If the edges start to pull up, may reinforce with tape. If the dressing is no longer working, may remove and cover with gauze and tape, but must keep the area dry and clean.  Call with any questions or concerns.   07/12/24 0826            Diagnostic Studies: CT ABDOMEN PELVIS W CONTRAST Result Date: 06/19/2024 EXAM: CT ABDOMEN AND PELVIS WITH CONTRAST 06/19/2024 08:23:01 AM TECHNIQUE: CT of the abdomen and pelvis was performed with the administration of intravenous contrast. Multiplanar reformatted images are provided for review. Automated exposure control, iterative reconstruction, and/or weight based adjustment of the mA/kV was utilized to reduce the radiation dose to as low as reasonably achievable. COMPARISON: 04/17/2022 and 08/12/2018 CLINICAL HISTORY: LLQ abdominal pain. Endorses pain to left sided abdomen. Began a few days ago; constant and waxes and wanes in intensity. Radiates to lower abdomen and back. No N/V/D. Thought constipation but relieved with Dulcolax. No fevers. FINDINGS: LOWER CHEST: Right lower lobe lung nodule measures 3 mm and is unchanged from 04/17/2022. Right lower lobe lung nodule measures 4 mm and is unchanged from 04/17/2022. LIVER: Subjective diffuse hepatic steatosis. 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 No  suspicious liver lesions. GALLBLADDER AND BILE DUCTS: Status post cholecystectomy. No significant bile duct dilatation. SPLEEN: Scattered cysts are identified within the spleen, unchanged in size and multiplicity when compared with the previous exam. The largest measures 1.4 cm. PANCREAS: No acute abnormality. ADRENAL GLANDS: No acute abnormality. KIDNEYS, URETERS AND BLADDER: Bosniak class 1 and 2 kidney cysts are identified, the largest is of the upper pole of the right kidney measuring 2.1 cm. No follow up imaging recommended for these kidney cysts unless otherwise clinically indicated. No kidney stones or signs of obstructive uropathy. GI AND BOWEL: Small hiatal hernia. The appendix is not visualized and is presumed to be surgically absent. No pathologic dilatation of the large or small bowel loops. Colonic diverticulosis. Focal inflammatory changes are identified involving the distal half of the sigmoid  colon with surrounding inflammatory soft tissue stranding and thickening of the adjacent peritoneal reflection. No abscess identified. PERITONEUM AND RETROPERITONEUM: No ascites. No free air. VASCULATURE: Aortic atherosclerosis. No aneurysm. Patent abdominal vascularity. LYMPH NODES: No signs of abdominal or pelvic adenopathy. REPRODUCTIVE ORGANS: No acute abnormality. BONES AND SOFT TISSUES: Multilevel lumbar spondylosis within the lumbar spine. No acute osseous abnormality. No focal soft tissue abnormality. IMPRESSION: 1. Colonic diverticulosis acute diverticulitis involving the descending colon. No signs of free perforation or abscess. 2. Aortic Electronically signed by: Waddell Calk MD 06/19/2024 08:43 AM EDT RP Workstation: [id number]    Disposition: Discharge disposition: 01-Home or Self Care       Discharge Instructions     Call MD / Call 911   Complete by: As directed    If you experience chest pain or shortness of breath, CALL 911 and be transported to the hospital emergency room.  If you  develope a fever above 101 F, pus (white drainage) or increased drainage or redness at the wound, or calf pain, call your surgeon's office.   Change dressing   Complete by: As directed    Maintain surgical dressing until follow up in the clinic. If the edges start to pull up, may reinforce with tape. If the dressing is no longer working, may remove and cover with gauze and tape, but must keep the area dry and clean.  Call with any questions or concerns.   Constipation Prevention   Complete by: As directed    Drink plenty of fluids.  Prune juice may be helpful.  You may use a stool softener, such as Colace (over the counter) 100 mg twice a day.  Use MiraLax  (over the counter) for constipation as needed.   Diet - low sodium heart healthy   Complete by: As directed    Increase activity slowly as tolerated   Complete by: As directed    Weight bearing as tolerated with assist device (walker, cane, etc) as directed, use it as long as suggested by your surgeon or therapist, typically at least 4-6 weeks.   Post-operative opioid taper instructions:   Complete by: As directed    POST-OPERATIVE OPIOID TAPER INSTRUCTIONS: It is important to wean off of your opioid medication as soon as possible. If you do not need pain medication after your surgery it is ok to stop day one. Opioids include: Codeine, Hydrocodone (Norco, Vicodin), Oxycodone (Percocet, oxycontin ) and hydromorphone  amongst others.  Long term and even short term use of opiods can cause: Increased pain response Dependence Constipation Depression Respiratory depression And more.  Withdrawal symptoms can include Flu like symptoms Nausea, vomiting And more Techniques to manage these symptoms Hydrate well Eat regular healthy meals Stay active Use relaxation techniques(deep breathing, meditating, yoga) Do Not substitute Alcohol to help with tapering If you have been on opioids for less than two weeks and do not have pain than it is ok to  stop all together.  Plan to wean off of opioids This plan should start within one week post op of your joint replacement. Maintain the same interval or time between taking each dose and first decrease the dose.  Cut the total daily intake of opioids by one tablet each day Next start to increase the time between doses. The last dose that should be eliminated is the evening dose.      TED hose   Complete by: As directed    Use stockings (TED hose) for 2 weeks on both leg(s).  You may remove them  at night for sleeping.        Follow-up Information     Ernie Cough, MD. Schedule an appointment as soon as possible for a visit in 2 week(s).   Specialty: Orthopedic Surgery Contact information: [address] [place] [address] [phone]                  Signed: Rosina JONELLE Fowler 07/18/2024, 9:26 AM

## 2024-07-25 DIAGNOSIS — Z96651 Presence of right artificial knee joint: Secondary | ICD-10-CM | POA: Diagnosis not present

## 2024-07-25 DIAGNOSIS — M1711 Unilateral primary osteoarthritis, right knee: Secondary | ICD-10-CM | POA: Diagnosis not present

## 2024-07-27 DIAGNOSIS — Z96651 Presence of right artificial knee joint: Secondary | ICD-10-CM | POA: Diagnosis not present

## 2024-07-27 DIAGNOSIS — M1711 Unilateral primary osteoarthritis, right knee: Secondary | ICD-10-CM | POA: Diagnosis not present

## 2024-07-28 DIAGNOSIS — M1711 Unilateral primary osteoarthritis, right knee: Secondary | ICD-10-CM | POA: Diagnosis not present

## 2024-07-28 DIAGNOSIS — Z96651 Presence of right artificial knee joint: Secondary | ICD-10-CM | POA: Diagnosis not present

## 2024-07-31 DIAGNOSIS — Z96651 Presence of right artificial knee joint: Secondary | ICD-10-CM | POA: Diagnosis not present

## 2024-07-31 DIAGNOSIS — M1711 Unilateral primary osteoarthritis, right knee: Secondary | ICD-10-CM | POA: Diagnosis not present

## 2024-08-02 DIAGNOSIS — Z96651 Presence of right artificial knee joint: Secondary | ICD-10-CM | POA: Diagnosis not present

## 2024-08-02 DIAGNOSIS — M1711 Unilateral primary osteoarthritis, right knee: Secondary | ICD-10-CM | POA: Diagnosis not present

## 2024-08-04 DIAGNOSIS — M1711 Unilateral primary osteoarthritis, right knee: Secondary | ICD-10-CM | POA: Diagnosis not present

## 2024-08-04 DIAGNOSIS — Z96651 Presence of right artificial knee joint: Secondary | ICD-10-CM | POA: Diagnosis not present

## 2024-08-08 DIAGNOSIS — M1711 Unilateral primary osteoarthritis, right knee: Secondary | ICD-10-CM | POA: Diagnosis not present

## 2024-08-08 DIAGNOSIS — Z96651 Presence of right artificial knee joint: Secondary | ICD-10-CM | POA: Diagnosis not present

## 2024-08-09 ENCOUNTER — Other Ambulatory Visit: Payer: Self-pay | Admitting: Family Medicine

## 2024-08-09 DIAGNOSIS — M8589 Other specified disorders of bone density and structure, multiple sites: Secondary | ICD-10-CM

## 2024-08-10 DIAGNOSIS — M1711 Unilateral primary osteoarthritis, right knee: Secondary | ICD-10-CM | POA: Diagnosis not present

## 2024-08-10 DIAGNOSIS — Z96651 Presence of right artificial knee joint: Secondary | ICD-10-CM | POA: Diagnosis not present

## 2024-08-15 DIAGNOSIS — M1711 Unilateral primary osteoarthritis, right knee: Secondary | ICD-10-CM | POA: Diagnosis not present

## 2024-08-15 DIAGNOSIS — Z96651 Presence of right artificial knee joint: Secondary | ICD-10-CM | POA: Diagnosis not present

## 2024-08-17 DIAGNOSIS — Z96651 Presence of right artificial knee joint: Secondary | ICD-10-CM | POA: Diagnosis not present

## 2024-08-17 DIAGNOSIS — M1711 Unilateral primary osteoarthritis, right knee: Secondary | ICD-10-CM | POA: Diagnosis not present

## 2024-08-22 DIAGNOSIS — M1711 Unilateral primary osteoarthritis, right knee: Secondary | ICD-10-CM | POA: Diagnosis not present

## 2024-08-22 DIAGNOSIS — Z96651 Presence of right artificial knee joint: Secondary | ICD-10-CM | POA: Diagnosis not present

## 2024-08-23 DIAGNOSIS — Z471 Aftercare following joint replacement surgery: Secondary | ICD-10-CM | POA: Diagnosis not present

## 2024-08-23 DIAGNOSIS — Z96651 Presence of right artificial knee joint: Secondary | ICD-10-CM | POA: Diagnosis not present

## 2024-08-24 DIAGNOSIS — M1711 Unilateral primary osteoarthritis, right knee: Secondary | ICD-10-CM | POA: Diagnosis not present

## 2024-08-24 DIAGNOSIS — Z96651 Presence of right artificial knee joint: Secondary | ICD-10-CM | POA: Diagnosis not present

## 2024-08-29 DIAGNOSIS — Z96651 Presence of right artificial knee joint: Secondary | ICD-10-CM | POA: Diagnosis not present

## 2024-08-29 DIAGNOSIS — M1711 Unilateral primary osteoarthritis, right knee: Secondary | ICD-10-CM | POA: Diagnosis not present

## 2024-08-31 DIAGNOSIS — Z96651 Presence of right artificial knee joint: Secondary | ICD-10-CM | POA: Diagnosis not present

## 2024-08-31 DIAGNOSIS — M1711 Unilateral primary osteoarthritis, right knee: Secondary | ICD-10-CM | POA: Diagnosis not present

## 2024-09-05 DIAGNOSIS — Z96651 Presence of right artificial knee joint: Secondary | ICD-10-CM | POA: Diagnosis not present

## 2024-09-05 DIAGNOSIS — M1711 Unilateral primary osteoarthritis, right knee: Secondary | ICD-10-CM | POA: Diagnosis not present

## 2024-09-07 DIAGNOSIS — Z96651 Presence of right artificial knee joint: Secondary | ICD-10-CM | POA: Diagnosis not present

## 2024-09-07 DIAGNOSIS — M1711 Unilateral primary osteoarthritis, right knee: Secondary | ICD-10-CM | POA: Diagnosis not present

## 2024-09-11 DIAGNOSIS — L814 Other melanin hyperpigmentation: Secondary | ICD-10-CM | POA: Diagnosis not present

## 2024-09-11 DIAGNOSIS — L821 Other seborrheic keratosis: Secondary | ICD-10-CM | POA: Diagnosis not present

## 2024-09-11 DIAGNOSIS — D1801 Hemangioma of skin and subcutaneous tissue: Secondary | ICD-10-CM | POA: Diagnosis not present

## 2024-09-12 DIAGNOSIS — M1711 Unilateral primary osteoarthritis, right knee: Secondary | ICD-10-CM | POA: Diagnosis not present

## 2024-09-12 DIAGNOSIS — Z96651 Presence of right artificial knee joint: Secondary | ICD-10-CM | POA: Diagnosis not present

## 2024-09-14 DIAGNOSIS — M1711 Unilateral primary osteoarthritis, right knee: Secondary | ICD-10-CM | POA: Diagnosis not present

## 2024-09-14 DIAGNOSIS — Z96651 Presence of right artificial knee joint: Secondary | ICD-10-CM | POA: Diagnosis not present

## 2024-09-17 NOTE — Progress Notes (Signed)
 [facility] [address] [place], [place] [address] [phone]  Date:  09/27/2024   Name:  Natalie Fowler   DOB:  11/21/1946   MRN:  [id number]  PCP:  Watt Harlene BROCKS, MD    Chief Complaint: Follow-up (No concerns )   History of Present Illness:  Natalie Fowler is a 78 y.o. very pleasant female patient who presents with the following:  Patient seen today for routine follow-up, I saw her most recently in May History of osteopenia, non insulin -dependent diabetes, dyslipidemia, gastric ulcer being managed by GI  She follows up regularly with a urologist to monitor renal cyst.  Note on chart from Dr. Excell from this past May In the interim she had a right total knee, performed on 8/19 per Dr. Ernie She also was in the ER in July with abdominal pain-diagnosed with diverticulitis.  This did cause a delay of her planned knee surgery but she was able to get it completed in August.  She is still doing physical therapy at home, notes that she is making good progress  Flu shot- done  COVID booster Tdap is UTD Urine micro- pt notes done by her urologist yesterday  Can update foot exam Has completed Shingrix and pneumococcal series, recommend RSV Lab Results  Component Value Date   HGBA1C 6.6 (H) 09/27/2024   Metformin  Losartan  25 Evisa Simvastatin  DEXA scan completed 2/24, osteopenia Mammogram can be updated in December- scheduled     Patient Active Problem List   Diagnosis Date Noted   S/P total knee arthroplasty, right 07/11/2024   S/P total knee arthroplasty, 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   Dyslipidemia 05/02/2021   Diet-controlled diabetes mellitus (HCC) 01/20/2021   Low back pain 11/19/2016   History of vitamin D  deficiency 09/30/2016   H/O colonoscopy with polypectomy 09/30/2015   Vaginal atrophy 09/25/2014   Vertigo    Osteopenia    Acid reflux     Past Medical History:  Diagnosis Date   Acid reflux     Arthritis    Harrold tumor    benign   Diabetes mellitus without complication (HCC)    Hypertension    Osteopenia    Ovarian cyst, bilateral    Pneumonia    PONV (postoperative nausea and vomiting)    Vertigo    Vitamin D  deficiency     Past Surgical History:  Procedure Laterality Date   APPENDECTOMY     CHOLECYSTECTOMY     CYST EXCISION     CHEST    JOINT REPLACEMENT     OOPHORECTOMY  10/23/2006   DIAG LAP WITH BSO   PELVIC LAPAROSCOPY  10/23/2006   DIAG LAP WITH BSO   ROTATOR CUFF REPAIR     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   Procedure: TOTAL KNEE ARTHROPLASTY;  Surgeon: Ernie Cough, MD;  Location: [facility];  Service: Orthopedics;  Laterality: Left;   TOTAL KNEE ARTHROPLASTY Right 07/11/2024   Procedure: ARTHROPLASTY, KNEE, TOTAL;  Surgeon: Ernie Cough, MD;  Location: [facility];  Service: Orthopedics;  Laterality: Right;    Social History   Tobacco Use   Smoking status: Never   Smokeless tobacco: Never  Vaping Use   Vaping status: Never Used  Substance Use Topics   Alcohol use: No    Alcohol/week: 0.0 standard drinks of alcohol   Drug use: Never    Family History  Problem Relation Age of Onset   Diabetes Mother    Hypertension  Mother    Colon cancer Father    Heart failure Father    Heart disease Father    Osteoporosis Sister    Pancreatic cancer Sister 65   Heart disease Maternal Aunt    Ovarian cancer Maternal Aunt    Heart attack Maternal Uncle    Colon cancer Paternal Uncle    Hypertension Daughter    Breast cancer Neg Hx    BRCA 1/2 Neg Hx     Allergies  Allergen Reactions   Pantoprazole  Cough   Latex Rash   Vioxx [Rofecoxib] Rash    Medication list has been reviewed and updated.  Current Outpatient Medications on File Prior to Visit  Medication Sig Dispense Refill   acetaminophen  (TYLENOL ) 500 MG tablet Take 500-1,000 mg by mouth every 6 (six) hours as needed (pain.).     Blood Glucose Monitoring Suppl (ONETOUCH VERIO FLEX SYSTEM)  w/Device KIT Use to check blood pressure once a day.  Dx code: E11.9 1 kit 0   Cholecalciferol (VITAMIN D ) 50 MCG (2000 UT) tablet Take 2,000 Units by mouth in the morning.     famotidine  (PEPCID ) 20 MG tablet Take 20 mg by mouth 2 (two) times daily.     glucose blood (ONETOUCH VERIO) test strip Use to check sugar once a day.  Dx code: E11.9 100 each 1   Lancets (ONETOUCH DELICA PLUS LANCET33G) MISC Use to check sugar once a day.  Dx Code: E11.9 100 each 1   losartan  (COZAAR ) 25 MG tablet Take 1 tablet (25 mg total) by mouth daily. 90 tablet 1   metFORMIN  (GLUCOPHAGE ) 1000 MG tablet Take 1 tablet (1,000 mg total) by mouth 2 (two) times daily with a meal. (Patient taking differently: Take 500-1,000 mg by mouth See admin instructions. Take 1 tablet (1000 mg) by mouth in the morning and 0.5 tablet (500 mg) by mouth in the evening) 180 tablet 3   methocarbamol  (ROBAXIN ) 500 MG tablet Take 1 tablet (500 mg total) by mouth every 6 (six) hours as needed for muscle spasms. 40 tablet 0   ondansetron  (ZOFRAN ) 4 MG tablet Take 1 tablet (4 mg total) by mouth every 6 (six) hours as needed for nausea. 20 tablet 0   oxyCODONE  (OXY IR/ROXICODONE ) 5 MG immediate release tablet Take 1-2 tablets (5-10 mg total) by mouth every 4 (four) hours as needed for severe pain (pain score 7-10). 42 tablet 0   polyethylene glycol powder (GLYCOLAX /MIRALAX ) 17 GM/SCOOP powder Take 17 g by mouth 2 (two) times daily. 238 g 0   raloxifene  (EVISTA ) 60 MG tablet Take 1 tablet by mouth once daily 90 tablet 0   simvastatin  (ZOCOR ) 20 MG tablet Take 1 tablet (20 mg total) by mouth at bedtime. 90 tablet 1   No current facility-administered medications on file prior to visit.    Review of Systems:  As per HPI- otherwise negative.   Physical Examination: Vitals:   09/27/24 0814  BP: 132/74  Pulse: 95  Temp: 98.2 F (36.8 C)  SpO2: 100%   Vitals:   09/27/24 0814  Weight: 160 lb 1 oz (72.6 kg)  Height: 5' (1.524 m)   Body  mass index is 31.26 kg/m. Ideal Body Weight: Weight in (lb) to have BMI = 25: 127.7  GEN: no acute distress.  Mild obesity, looks well  HEENT: Atraumatic, Normocephalic.  Ears and Nose: No external deformity. CV: RRR, No M/G/R. No JVD. No thrill. No extra heart sounds. PULM: CTA B, no wheezes, crackles, rhonchi.  No retractions. No resp. distress. No accessory muscle use. ABD: S, NT, ND, +BS. No rebound. No HSM. EXTR: No c/c/e PSYCH: Normally interactive. Conversant.    Assessment and Plan: Diet-controlled diabetes mellitus (HCC) - Plan: Comprehensive metabolic panel with GFR, Hemoglobin A1c, CANCELED: Microalbumin / creatinine urine ratio  Dyslipidemia - Plan: Lipid panel  Osteopenia of multiple sites  Screening, deficiency anemia, iron - Plan: CBC  Patient seen today for periodic follow-up, diabetes testicular been well-controlled.  Will check her A1c and other labs as above Follow-up on lipids today, she is compliant with her simvastatin  Continue to take at least for bone density, tolerating well Screen for any deficiency anemia today Signed Harlene Schroeder, MD  Received labs as below, message to patient  Results for orders placed or performed in visit on 09/27/24  CBC   Collection Time: 09/27/24  8:35 AM  Result Value Ref Range   WBC 6.4 4.0 - 10.5 K/uL   RBC 4.55 3.87 - 5.11 Mil/uL   Platelets 185.0 150.0 - 400.0 K/uL   Hemoglobin 13.1 12.0 - 15.0 g/dL   HCT 60.4 63.9 - 53.9 %   MCV 86.7 78.0 - 100.0 fl   MCHC 33.3 30.0 - 36.0 g/dL   RDW 85.9 88.4 - 84.4 %  Comprehensive metabolic panel with GFR   Collection Time: 09/27/24  8:35 AM  Result Value Ref Range   Sodium 142 135 - 145 mEq/L   Potassium 4.6 3.5 - 5.1 mEq/L   Chloride 105 96 - 112 mEq/L   CO2 30 19 - 32 mEq/L   Glucose, Bld 137 (H) 70 - 99 mg/dL   BUN 15 6 - 23 mg/dL   Creatinine, Ser 9.01 0.40 - 1.20 mg/dL   Total Bilirubin 0.4 0.2 - 1.2 mg/dL   Alkaline Phosphatase 51 39 - 117 U/L   AST 18 0 - 37  U/L   ALT 23 0 - 35 U/L   Total Protein 6.6 6.0 - 8.3 g/dL   Albumin 4.2 3.5 - 5.2 g/dL   GFR 43.93 (L) >39.99 mL/min   Calcium 9.5 8.4 - 10.5 mg/dL  Lipid panel   Collection Time: 09/27/24  8:35 AM  Result Value Ref Range   Cholesterol 148 0 - 200 mg/dL   Triglycerides 800.9 (H) 0.0 - 149.0 mg/dL   HDL 55.69 >60.99 mg/dL   VLDL 60.1 0.0 - 59.9 mg/dL   LDL Cholesterol 64 0 - 99 mg/dL   Total CHOL/HDL Ratio 3    NonHDL 103.91   Hemoglobin A1c   Collection Time: 09/27/24  8:35 AM  Result Value Ref Range   Hgb A1c MFr Bld 6.6 (H) 4.6 - 6.5 %

## 2024-09-17 NOTE — Patient Instructions (Addendum)
 It was good to see you today, recommend a COVID booster this fall if not done already I will be in touch with your labs asap If all is well please see me in about 6 months

## 2024-09-18 ENCOUNTER — Other Ambulatory Visit: Payer: Self-pay | Admitting: Family Medicine

## 2024-09-18 DIAGNOSIS — Z1231 Encounter for screening mammogram for malignant neoplasm of breast: Secondary | ICD-10-CM

## 2024-09-19 ENCOUNTER — Ambulatory Visit: Payer: PPO

## 2024-09-19 DIAGNOSIS — M1711 Unilateral primary osteoarthritis, right knee: Secondary | ICD-10-CM | POA: Diagnosis not present

## 2024-09-19 DIAGNOSIS — Z96651 Presence of right artificial knee joint: Secondary | ICD-10-CM | POA: Diagnosis not present

## 2024-09-21 ENCOUNTER — Ambulatory Visit

## 2024-09-21 VITALS — Ht 60.0 in | Wt 158.0 lb

## 2024-09-21 DIAGNOSIS — Z0000 Encounter for general adult medical examination without abnormal findings: Secondary | ICD-10-CM | POA: Diagnosis not present

## 2024-09-21 DIAGNOSIS — M1711 Unilateral primary osteoarthritis, right knee: Secondary | ICD-10-CM | POA: Diagnosis not present

## 2024-09-21 DIAGNOSIS — Z96651 Presence of right artificial knee joint: Secondary | ICD-10-CM | POA: Diagnosis not present

## 2024-09-21 NOTE — Progress Notes (Signed)
 Because this visit was a virtual/telehealth visit,  certain criteria was not obtained, such a blood pressure, CBG if applicable, and timed get up and go. Any medications not marked as taking were not mentioned during the medication reconciliation part of the visit. Any vitals not documented were not able to be obtained due to this being a telehealth visit or patient was unable to self-report a recent blood pressure reading due to a lack of equipment at home via telehealth. Vitals that have been documented are verbally provided by the patient.   Subjective:   Natalie Fowler is a 78 y.o. who presents for a Medicare Wellness preventive visit.  As a reminder, Annual Wellness Visits don't include a physical exam, and some assessments may be limited, especially if this visit is performed virtually. We may recommend an in-person follow-up visit with your provider if needed.  Visit Complete: Virtual I connected with  Natalie Fowler on 09/21/24 by a audio enabled telemedicine application and verified that I am speaking with the correct person using two identifiers.  Patient Location: Home  Provider Location: Home Office  I discussed the limitations of evaluation and management by telemedicine. The patient expressed understanding and agreed to proceed.  Vital Signs: Because this visit was a virtual/telehealth visit, some criteria may be missing or patient reported. Any vitals not documented were not able to be obtained and vitals that have been documented are patient reported.  VideoDeclined- This patient declined Librarian, academic. Therefore the visit was completed with audio only.  Persons Participating in Visit: Patient.  AWV Questionnaire: No: Patient Medicare AWV questionnaire was not completed prior to this visit.  Cardiac Risk Factors include: advanced age (>73men, >5 women);diabetes mellitus;dyslipidemia;obesity (BMI >30kg/m2);family history of premature  cardiovascular disease;hypertension     Objective:    Today's Vitals   09/21/24 1617  Weight: 158 lb (71.7 kg)  Height: 5' (1.524 m)  PainSc: 4   PainLoc: Knee   Body mass index is 30.86 kg/m.     09/21/2024    4:23 PM 07/11/2024    5:57 AM 06/19/2024    7:22 AM 06/12/2024    9:19 AM 09/17/2023    3:42 PM 06/29/2023   12:49 PM 09/15/2022    8:22 AM  Advanced Directives  Does Patient Have a Medical Advance Directive? Yes Yes No Yes No Yes Yes  Type of Estate Agent of [place];Living will Healthcare Power of [place];Living will  Living will  Healthcare Power of [place];Living will Healthcare Power of [place];Living will  Does patient want to make changes to medical advance directive?  No - Patient declined     No - Patient declined  Copy of Healthcare Power of Attorney in Chart? No - copy requested No - copy requested     No - copy requested  Would patient like information on creating a medical advance directive?   No - Patient declined  No - Patient declined      Current Medications (verified) Outpatient Encounter Medications as of 09/21/2024  Medication Sig   acetaminophen  (TYLENOL ) 500 MG tablet Take 500-1,000 mg by mouth every 6 (six) hours as needed (pain.).   Blood Glucose Monitoring Suppl (ONETOUCH VERIO FLEX SYSTEM) w/Device KIT Use to check blood pressure once a day.  Dx code: E11.9   Cholecalciferol (VITAMIN D ) 50 MCG (2000 UT) tablet Take 2,000 Units by mouth in the morning.   famotidine  (PEPCID ) 20 MG tablet Take 20 mg by mouth 2 (two) times daily.  glucose blood (ONETOUCH VERIO) test strip Use to check sugar once a day.  Dx code: E11.9   Lancets (ONETOUCH DELICA PLUS LANCET33G) MISC Use to check sugar once a day.  Dx Code: E11.9   losartan  (COZAAR ) 25 MG tablet Take 1 tablet (25 mg total) by mouth daily.   metFORMIN  (GLUCOPHAGE ) 1000 MG tablet Take 1 tablet (1,000 mg total) by mouth 2 (two) times daily with a meal. (Patient taking differently:  Take 500-1,000 mg by mouth See admin instructions. Take 1 tablet (1000 mg) by mouth in the morning and 0.5 tablet (500 mg) by mouth in the evening)   methocarbamol  (ROBAXIN ) 500 MG tablet Take 1 tablet (500 mg total) by mouth every 6 (six) hours as needed for muscle spasms.   ondansetron  (ZOFRAN ) 4 MG tablet Take 1 tablet (4 mg total) by mouth every 6 (six) hours as needed for nausea.   oxyCODONE  (OXY IR/ROXICODONE ) 5 MG immediate release tablet Take 1-2 tablets (5-10 mg total) by mouth every 4 (four) hours as needed for severe pain (pain score 7-10).   polyethylene glycol powder (GLYCOLAX /MIRALAX ) 17 GM/SCOOP powder Take 17 g by mouth 2 (two) times daily.   raloxifene  (EVISTA ) 60 MG tablet Take 1 tablet by mouth once daily   simvastatin  (ZOCOR ) 20 MG tablet Take 1 tablet (20 mg total) by mouth at bedtime.   No facility-administered encounter medications on file as of 09/21/2024.    Allergies (verified) Pantoprazole , Latex, and Vioxx [rofecoxib]   History: Past Medical History:  Diagnosis Date   Acid reflux    Arthritis    Brenner tumor    benign   Diabetes mellitus without complication (HCC)    Hypertension    Osteopenia    Ovarian cyst, bilateral    Pneumonia    PONV (postoperative nausea and vomiting)    Vertigo    Vitamin D  deficiency    Past Surgical History:  Procedure Laterality Date   APPENDECTOMY     CHOLECYSTECTOMY     CYST EXCISION     CHEST    JOINT REPLACEMENT     OOPHORECTOMY  10/23/2006   DIAG LAP WITH BSO   PELVIC LAPAROSCOPY  10/23/2006   DIAG LAP WITH BSO   ROTATOR CUFF REPAIR     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   Procedure: TOTAL KNEE ARTHROPLASTY;  Surgeon: Ernie Cough, MD;  Location: [facility];  Service: Orthopedics;  Laterality: Left;   TOTAL KNEE ARTHROPLASTY Right 07/11/2024   Procedure: ARTHROPLASTY, KNEE, TOTAL;  Surgeon: Ernie Cough, MD;  Location: [facility];  Service: Orthopedics;  Laterality: Right;   Family History  Problem Relation  Age of Onset   Diabetes Mother    Hypertension Mother    Colon cancer Father    Heart failure Father    Heart disease Father    Osteoporosis Sister    Pancreatic cancer Sister 8   Heart disease Maternal Aunt    Ovarian cancer Maternal Aunt    Heart attack Maternal Uncle    Colon cancer Paternal Uncle    Hypertension Daughter    Breast cancer Neg Hx    BRCA 1/2 Neg Hx    Social History   Socioeconomic History   Marital status: Married    Spouse name: Not on file   Number of children: Not on file   Years of education: Not on file   Highest education level: Not on file  Occupational History   Not on file  Tobacco Use   Smoking  status: Never   Smokeless tobacco: Never  Vaping Use   Vaping status: Never Used  Substance and Sexual Activity   Alcohol use: No    Alcohol/week: 0.0 standard drinks of alcohol   Drug use: Never   Sexual activity: Yes    Partners: Male    Birth control/protection: Post-menopausal    Comment: 1st intercourse- 24, partners- 1, married- 42 yrs   Other Topics Concern   Not on file  Social History Narrative   Not on file   Social Drivers of Health   Financial Resource Strain: Low Risk  (09/21/2024)   Overall Financial Resource Strain (CARDIA)    Difficulty of Paying Living Expenses: Not hard at all  Food Insecurity: No Food Insecurity (09/21/2024)   Hunger Vital Sign    Worried About Running Out of Food in the Last Year: Never true    Ran Out of Food in the Last Year: Never true  Transportation Needs: No Transportation Needs (09/21/2024)   PRAPARE - Administrator, Civil Service (Medical): No    Lack of Transportation (Non-Medical): No  Physical Activity: Sufficiently Active (09/21/2024)   Exercise Vital Sign    Days of Exercise per Week: 5 days    Minutes of Exercise per Session: 30 min  Stress: No Stress Concern Present (09/21/2024)   [employer] of Occupational Health - Occupational Stress Questionnaire    Feeling of  Stress: Not at all  Social Connections: Moderately Integrated (09/21/2024)   Social Connection and Isolation Panel    Frequency of Communication with Friends and Family: More than three times a week    Frequency of Social Gatherings with Friends and Family: Once a week    Attends Religious Services: Never    Database Administrator or Organizations: Yes    Attends Engineer, Structural: More than 4 times per year    Marital Status: Married    Tobacco Counseling Counseling given: Not Answered    Clinical Intake:  Pre-visit preparation completed: Yes  Pain : 0-10 Pain Score: 4  Pain Type: Chronic pain Pain Location: Knee Pain Orientation: Right Pain Descriptors / Indicators: Aching, Constant Pain Onset: More than a month ago Pain Frequency: Constant     BMI - recorded: 30.86 Nutritional Risks: None Diabetes: Yes CBG done?: No Did pt. bring in CBG monitor from home?: No  Lab Results  Component Value Date   HGBA1C 6.6 (H) 06/12/2024   HGBA1C 7.1 (H) 03/27/2024   HGBA1C 6.5 09/27/2023     How often do you need to have someone help you when you read instructions, pamphlets, or other written materials from your doctor or pharmacy?: 1 - Never  Interpreter Needed?: No  Information entered by :: Roz Fuller, LPN.   Activities of Daily Living     09/21/2024    4:27 PM 07/11/2024   10:58 AM  In your present state of health, do you have any difficulty performing the following activities:  Hearing? 1   Comment HEARING AIDS   Vision? 0   Difficulty concentrating or making decisions? 0   Walking or climbing stairs? 0   Dressing or bathing? 0   Doing errands, shopping? 0 0  Preparing Food and eating ? N   Using the Toilet? N   In the past six months, have you accidently leaked urine? N   Do you have problems with loss of bowel control? N   Managing your Medications? N   Managing your Finances? N  Housekeeping or managing your Housekeeping? N      Patient Care Team: Copland, Harlene BROCKS, MD as PCP - General (Family Medicine)  I have updated your Care Teams any recent Medical Services you may have received from other providers in the past year.     Assessment:   This is a routine wellness examination for [place].  Hearing/Vision screen Hearing Screening - Comments:: Patient has adequate hearing.  No hearing aids. Vision Screening - Comments:: Patient has adequate vision with use of eyeglasses. Patient sees Dr. Sameul   Goals Addressed   None    Depression Screen     09/21/2024    4:30 PM 09/27/2023    8:22 AM 09/17/2023    3:40 PM 01/11/2023    3:43 PM 09/15/2022    8:21 AM 09/14/2022    8:40 AM 09/09/2021    9:11 AM  PHQ 2/9 Scores  PHQ - 2 Score 0 0 0 0 0 0 0  PHQ- 9 Score 1 0 0        Fall Risk     09/21/2024    4:20 PM 09/27/2023    8:22 AM 09/17/2023    3:43 PM 01/11/2023    3:43 PM 09/15/2022    8:21 AM  Fall Risk   Falls in the past year? 0 0 0 0 0  Number falls in past yr: 0 0 0 0 0  Injury with Fall? 0 0 0 0 0  Risk for fall due to : No Fall Risks No Fall Risks No Fall Risks No Fall Risks No Fall Risks  Follow up Falls evaluation completed Falls evaluation completed Falls prevention discussed;Falls evaluation completed Falls evaluation completed Falls evaluation completed      Data saved with a previous flowsheet row definition    MEDICARE RISK AT HOME:  Medicare Risk at Home Any stairs in or around the home?: Yes (15  STEPS) If so, are there any without handrails?: No Home free of loose throw rugs in walkways, pet beds, electrical cords, etc?: Yes Adequate lighting in your home to reduce risk of falls?: Yes Life alert?: No Use of a cane, walker or w/c?: No Grab bars in the bathroom?: Yes Shower chair or bench in shower?: Yes (BUILT IN SEAT) Elevated toilet seat or a handicapped toilet?: Yes  TIMED UP AND GO:  Was the test performed?  No  Cognitive Function: Declined/Normal: No  cognitive concerns noted by patient or family. Patient alert, oriented, able to answer questions appropriately and recall recent events. No signs of memory loss or confusion.    09/21/2024    4:29 PM  MMSE - Mini Mental State Exam  Not completed: Unable to complete        09/21/2024    4:39 PM 09/17/2023    3:44 PM 09/15/2022    8:30 AM  6CIT Screen  What Year? 0 points 0 points 0 points  What month? 0 points 0 points 0 points  What time? 0 points 0 points 0 points  Count back from 20 0 points 0 points 2 points  Months in reverse 0 points 0 points 0 points  Repeat phrase 0 points 2 points 4 points  Total Score 0 points 2 points 6 points    Immunizations Immunization History  Administered Date(s) Administered   Fluad Quad(high Dose 65+) 09/26/2020, 09/11/2021, 09/14/2022   Fluad Trivalent(High Dose 65+) 09/27/2023   PFIZER(Purple Top)SARS-COV-2 Vaccination 12/29/2019, 01/23/2020, 08/25/2020, 08/31/2022   Pfizer Covid-19 Firefighter Booster 5y-11y  09/15/2021   Pneumococcal Conjugate-13 08/28/2014   Pneumococcal Polysaccharide-23 09/16/2015   Td 08/28/2014   Tdap 09/21/2013   Zoster Recombinant(Shingrix) 11/07/2018, 03/08/2019    Screening Tests Health Maintenance  Topic Date Due   Diabetic kidney evaluation - Urine ACR  Never done   Influenza Vaccine  06/23/2024   COVID-19 Vaccine (6 - 2025-26 season) 07/24/2024   DTaP/Tdap/Td (3 - Td or Tdap) 08/28/2024   FOOT EXAM  09/26/2024   HEMOGLOBIN A1C  12/13/2024   OPHTHALMOLOGY EXAM  02/22/2025   Diabetic kidney evaluation - eGFR measurement  07/12/2025   Medicare Annual Wellness (AWV)  09/21/2025   Pneumococcal Vaccine: 50+ Years  Completed   DEXA SCAN  Completed   Hepatitis C Screening  Completed   Zoster Vaccines- Shingrix  Completed   Meningococcal B Vaccine  Aged Out   Mammogram  Discontinued   Colonoscopy  Discontinued    Health Maintenance Items Addressed: Vaccines Due: Flu, Covid, Dta, Labs Due Diabetic  Kidney Evaluation-Urine ACR  Additional Screening:  Vision Screening: Recommended annual ophthalmology exams for early detection of glaucoma and other disorders of the eye. Is the patient up to date with their annual eye exam?  Yes  Who is the provider or what is the name of the office in which the patient attends annual eye exams? Dr. Sameul  Dental Screening: Recommended annual dental exams for proper oral hygiene  Community Resource Referral / Chronic Care Management: CRR required this visit?  No   CCM required this visit?  No   Plan:    I have personally reviewed and noted the following in the patient's chart:   Medical and social history Use of alcohol, tobacco or illicit drugs  Current medications and supplements including opioid prescriptions. Patient is currently taking opioid prescriptions. Information provided to patient regarding non-opioid alternatives. Patient advised to discuss non-opioid treatment plan with their provider. Functional ability and status Nutritional status Physical activity Advanced directives List of other physicians Hospitalizations, surgeries, and ER visits in previous 12 months Vitals Screenings to include cognitive, depression, and falls Referrals and appointments  In addition, I have reviewed and discussed with patient certain preventive protocols, quality metrics, and best practice recommendations. A written personalized care plan for preventive services as well as general preventive health recommendations were provided to patient.   Roz LOISE Fuller, LPN   [id number]   After Visit Summary: (MyChart) Due to this being a telephonic visit, the after visit summary with patients personalized plan was offered to patient via MyChart   Notes: Please refer to Routing Comments.

## 2024-09-21 NOTE — Patient Instructions (Signed)
 Natalie Fowler,  Thank you for taking the time for your Medicare Wellness Visit. I appreciate your continued commitment to your health goals. Please review the care plan we discussed, and feel free to reach out if I can assist you further.  Medicare recommends these wellness visits once per year to help you and your care team stay ahead of potential health issues. These visits are designed to focus on prevention, allowing your provider to concentrate on managing your acute and chronic conditions during your regular appointments.  Please note that Annual Wellness Visits do not include a physical exam. Some assessments may be limited, especially if the visit was conducted virtually. If needed, we may recommend a separate in-person follow-up with your provider.  Ongoing Care Seeing your primary care provider every 3 to 6 months helps us  monitor your health and provide consistent, personalized care.   Referrals If a referral was made during today's visit and you haven't received any updates within two weeks, please contact the referred provider directly to check on the status.  Recommended Screenings:  Health Maintenance  Topic Date Due   Yearly kidney health urinalysis for diabetes  Never done   Flu Shot  06/23/2024   COVID-19 Vaccine (6 - 2025-26 season) 07/24/2024   DTaP/Tdap/Td vaccine (3 - Td or Tdap) 08/28/2024   Complete foot exam   09/26/2024   Hemoglobin A1C  12/13/2024   Eye exam for diabetics  02/22/2025   Yearly kidney function blood test for diabetes  07/12/2025   Medicare Annual Wellness Visit  09/21/2025   Pneumococcal Vaccine for age over 75  Completed   DEXA scan (bone density measurement)  Completed   Hepatitis C Screening  Completed   Zoster (Shingles) Vaccine  Completed   Meningitis B Vaccine  Aged Out   Breast Cancer Screening  Discontinued   Colon Cancer Screening  Discontinued       09/21/2024    4:23 PM  Advanced Directives  Does Patient Have a Medical Advance  Directive? Yes  Type of Estate Agent of [place];Living will  Copy of Healthcare Power of Attorney in Chart? No - copy requested   Advance Care Planning is important because it: Ensures you receive medical care that aligns with your values, goals, and preferences. Provides guidance to your family and loved ones, reducing the emotional burden of decision-making during critical moments.  Vision: Annual vision screenings are recommended for early detection of glaucoma, cataracts, and diabetic retinopathy. These exams can also reveal signs of chronic conditions such as diabetes and high blood pressure.  Dental: Annual dental screenings help detect early signs of oral cancer, gum disease, and other conditions linked to overall health, including heart disease and diabetes.  Please see the attached documents for additional preventive care recommendations.

## 2024-09-21 NOTE — Progress Notes (Signed)
 Natalie Fowler                                          MRN: [id number]   09/21/2024   The VBCI Quality Team Specialist reviewed this patient medical record for the purposes of chart review for care gap closure. The following were reviewed: chart review for care gap closure-kidney health evaluation for diabetes:eGFR  and uACR.    VBCI Quality Team

## 2024-09-25 DIAGNOSIS — M1711 Unilateral primary osteoarthritis, right knee: Secondary | ICD-10-CM | POA: Diagnosis not present

## 2024-09-25 DIAGNOSIS — Z96651 Presence of right artificial knee joint: Secondary | ICD-10-CM | POA: Diagnosis not present

## 2024-09-26 DIAGNOSIS — N3010 Interstitial cystitis (chronic) without hematuria: Secondary | ICD-10-CM | POA: Diagnosis not present

## 2024-09-27 ENCOUNTER — Encounter: Payer: Self-pay | Admitting: Family Medicine

## 2024-09-27 ENCOUNTER — Ambulatory Visit: Admitting: Family Medicine

## 2024-09-27 VITALS — BP 132/74 | HR 95 | Temp 98.2°F | Ht 60.0 in | Wt 160.1 lb

## 2024-09-27 DIAGNOSIS — M8589 Other specified disorders of bone density and structure, multiple sites: Secondary | ICD-10-CM

## 2024-09-27 DIAGNOSIS — E119 Type 2 diabetes mellitus without complications: Secondary | ICD-10-CM | POA: Diagnosis not present

## 2024-09-27 DIAGNOSIS — Z7984 Long term (current) use of oral hypoglycemic drugs: Secondary | ICD-10-CM | POA: Diagnosis not present

## 2024-09-27 DIAGNOSIS — E785 Hyperlipidemia, unspecified: Secondary | ICD-10-CM | POA: Diagnosis not present

## 2024-09-27 DIAGNOSIS — Z130 Encounter for screening for diseases of the blood and blood-forming organs and certain disorders involving the immune mechanism: Secondary | ICD-10-CM | POA: Diagnosis not present

## 2024-09-27 LAB — CBC
HCT: 39.5 % (ref 36.0–46.0)
Hemoglobin: 13.1 g/dL (ref 12.0–15.0)
MCHC: 33.3 g/dL (ref 30.0–36.0)
MCV: 86.7 fl (ref 78.0–100.0)
Platelets: 185 K/uL (ref 150.0–400.0)
RBC: 4.55 Mil/uL (ref 3.87–5.11)
RDW: 14 % (ref 11.5–15.5)
WBC: 6.4 K/uL (ref 4.0–10.5)

## 2024-09-27 LAB — COMPREHENSIVE METABOLIC PANEL WITH GFR
ALT: 23 U/L (ref 0–35)
AST: 18 U/L (ref 0–37)
Albumin: 4.2 g/dL (ref 3.5–5.2)
Alkaline Phosphatase: 51 U/L (ref 39–117)
BUN: 15 mg/dL (ref 6–23)
CO2: 30 meq/L (ref 19–32)
Calcium: 9.5 mg/dL (ref 8.4–10.5)
Chloride: 105 meq/L (ref 96–112)
Creatinine, Ser: 0.98 mg/dL (ref 0.40–1.20)
GFR: 56.06 mL/min — ABNORMAL LOW (ref >60.00)
Glucose, Bld: 137 mg/dL — ABNORMAL HIGH (ref 70–99)
Potassium: 4.6 meq/L (ref 3.5–5.1)
Sodium: 142 meq/L (ref 135–145)
Total Bilirubin: 0.4 mg/dL (ref 0.2–1.2)
Total Protein: 6.6 g/dL (ref 6.0–8.3)

## 2024-09-27 LAB — LIPID PANEL
Cholesterol: 148 mg/dL (ref 0–200)
HDL: 44.3 mg/dL (ref >39.00)
LDL Cholesterol: 64 mg/dL (ref 0–99)
NonHDL: 103.91
Total CHOL/HDL Ratio: 3
Triglycerides: 199 mg/dL — ABNORMAL HIGH (ref 0.0–149.0)
VLDL: 39.8 mg/dL (ref 0.0–40.0)

## 2024-09-27 LAB — HEMOGLOBIN A1C: Hgb A1c MFr Bld: 6.6 % — ABNORMAL HIGH (ref 4.6–6.5)

## 2024-10-10 ENCOUNTER — Other Ambulatory Visit: Payer: Self-pay | Admitting: Family Medicine

## 2024-10-10 DIAGNOSIS — E119 Type 2 diabetes mellitus without complications: Secondary | ICD-10-CM

## 2024-10-17 ENCOUNTER — Other Ambulatory Visit: Payer: Self-pay | Admitting: Family Medicine

## 2024-10-17 DIAGNOSIS — E119 Type 2 diabetes mellitus without complications: Secondary | ICD-10-CM

## 2024-10-18 ENCOUNTER — Encounter: Payer: Self-pay | Admitting: Pharmacist

## 2024-10-18 NOTE — Progress Notes (Signed)
 Pharmacy Quality Measure Review  This patient is appearing on a report for being at risk of failing the adherence measure for diabetes medications this calendar year.   Medication: Metformin   Last fill date: 10/10/24 for 90 day supply  Insurance report was not up to date. No action needed at this time.   Darrelyn Drum, PharmD, BCPS, CPP Clinical Pharmacist Practitioner [facility] [phone]

## 2024-10-26 ENCOUNTER — Encounter: Payer: Self-pay | Admitting: Pharmacist

## 2024-10-26 ENCOUNTER — Other Ambulatory Visit: Payer: Self-pay | Admitting: Pharmacist

## 2024-10-26 DIAGNOSIS — E785 Hyperlipidemia, unspecified: Secondary | ICD-10-CM

## 2024-10-26 MED ORDER — SIMVASTATIN 20 MG PO TABS: 20.0000 mg | ORAL_TABLET | Freq: Every day | ORAL | 1 refills | Status: AC

## 2024-10-26 NOTE — Progress Notes (Signed)
 Pharmacy Quality Measure Review  This patient is appearing on a report for being at risk of failing the adherence measure for diabetes medications this calendar year.   Medication: metformin   Last fill date: 06/16/2024 for 90 day supply per adherence report  Reviewed recent refill history in Dr Annemarie database. Actual last refill date was 10/10/2024 for 90 day supply. Patient has 1 refill remaining. Next appointment with PCP is 03/28/2025.  Also reviewed refills for losartan  - last filled 90 day supply 10/17/2024 and has 1 refill remaining.Simvastatin  last refilled 08/28/2024 but no refills remaining. Send in Rx for #100 + 1 refill to last until her next appt in May 2026.    Insurance report was not up to date. Updated simvastatin  Rx.   Madelin Ray, PharmD Clinical Pharmacist [facility]  Population Health [phone]

## 2024-11-01 DIAGNOSIS — D120 Benign neoplasm of cecum: Secondary | ICD-10-CM | POA: Diagnosis not present

## 2024-11-10 ENCOUNTER — Inpatient Hospital Stay: Admission: RE | Admit: 2024-11-10 | Discharge: 2024-11-10 | Attending: Family Medicine | Admitting: Family Medicine

## 2024-11-10 DIAGNOSIS — Z1231 Encounter for screening mammogram for malignant neoplasm of breast: Secondary | ICD-10-CM

## 2024-11-11 ENCOUNTER — Other Ambulatory Visit: Payer: Self-pay | Admitting: Family Medicine

## 2024-11-11 DIAGNOSIS — M8589 Other specified disorders of bone density and structure, multiple sites: Secondary | ICD-10-CM

## 2025-03-28 ENCOUNTER — Ambulatory Visit: Admitting: Family Medicine
